# Patient Record
Sex: Female | Born: 1990
Health system: Southern US, Community
[De-identification: ages and names within clinical notes are randomized; demographics above are authoritative.]

## PROBLEM LIST (undated history)

## (undated) ENCOUNTER — Inpatient Hospital Stay (HOSPITAL_COMMUNITY): Payer: Self-pay

## (undated) DIAGNOSIS — F419 Anxiety disorder, unspecified: Secondary | ICD-10-CM

## (undated) DIAGNOSIS — F32A Depression, unspecified: Secondary | ICD-10-CM

## (undated) DIAGNOSIS — F410 Panic disorder [episodic paroxysmal anxiety] without agoraphobia: Secondary | ICD-10-CM

## (undated) DIAGNOSIS — T300 Burn of unspecified body region, unspecified degree: Secondary | ICD-10-CM

## (undated) DIAGNOSIS — F329 Major depressive disorder, single episode, unspecified: Secondary | ICD-10-CM

## (undated) HISTORY — PX: ABSCESS DRAINAGE: SHX1119

---
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
## 2008-02-13 ENCOUNTER — Emergency Department: Payer: Self-pay | Admitting: Emergency Medicine

## 2009-02-10 ENCOUNTER — Emergency Department: Payer: Self-pay | Admitting: Emergency Medicine

## 2010-01-05 ENCOUNTER — Emergency Department: Payer: Self-pay | Admitting: Unknown Physician Specialty

## 2010-06-07 ENCOUNTER — Observation Stay: Payer: Self-pay | Admitting: Surgery

## 2010-08-28 ENCOUNTER — Emergency Department: Payer: Self-pay | Admitting: Unknown Physician Specialty

## 2011-01-22 ENCOUNTER — Observation Stay: Payer: Self-pay

## 2011-01-23 ENCOUNTER — Ambulatory Visit: Payer: Self-pay | Admitting: Internal Medicine

## 2011-02-15 ENCOUNTER — Encounter: Payer: Self-pay | Admitting: *Deleted

## 2011-02-15 ENCOUNTER — Emergency Department (HOSPITAL_COMMUNITY)
Admission: EM | Admit: 2011-02-15 | Discharge: 2011-02-15 | Disposition: A | Discharge status: Home / Self Care | Payer: Medicaid Other | Attending: Emergency Medicine | Admitting: Emergency Medicine

## 2011-02-15 DIAGNOSIS — L03319 Cellulitis of trunk, unspecified: Secondary | ICD-10-CM | POA: Insufficient documentation

## 2011-02-15 DIAGNOSIS — L02215 Cutaneous abscess of perineum: Secondary | ICD-10-CM

## 2011-02-15 DIAGNOSIS — Z331 Pregnant state, incidental: Secondary | ICD-10-CM | POA: Insufficient documentation

## 2011-02-15 DIAGNOSIS — F17200 Nicotine dependence, unspecified, uncomplicated: Secondary | ICD-10-CM | POA: Insufficient documentation

## 2011-02-15 DIAGNOSIS — L02219 Cutaneous abscess of trunk, unspecified: Secondary | ICD-10-CM | POA: Insufficient documentation

## 2011-02-15 MED ORDER — OXYCODONE-ACETAMINOPHEN 5-325 MG PO TABS
ORAL_TABLET | ORAL | Status: AC
  Filled 2011-02-15: qty 1

## 2011-02-15 MED ORDER — LIDOCAINE HCL 2 % IJ SOLN
5.0000 mL | Freq: Once | INTRAMUSCULAR | Status: DC
  Filled 2011-02-15: qty 1

## 2011-02-15 MED ORDER — OXYCODONE-ACETAMINOPHEN 5-325 MG PO TABS
2.0000 | ORAL_TABLET | Freq: Once | ORAL | Status: AC
  Administered 2011-02-15: 2 via ORAL
  Filled 2011-02-15: qty 1

## 2011-02-15 MED ORDER — CEPHALEXIN 500 MG PO CAPS: 500.0000 mg | ORAL_CAPSULE | Freq: Four times a day (QID) | ORAL | Status: AC

## 2011-02-15 NOTE — ED Provider Notes (Signed)
History     CSN: [id number] Arrival date & time: 02/15/2011 12:56 PM  Chief Complaint  Patient presents with  . Abscess   HPI Pt reports she noticed pain and swelling to her L perineal area for the last 2-3 days. Denies any drainage or fever. She had a similar abscess on her R side several years ago. She is approximately [redacted]wks pregnant, no complications of pregnancy. No vaginal bleeding or discharge.  Past Medical History  Diagnosis Date  . Pregnant state, incidental     Past Surgical History  Procedure Date  . Abscess drainage     No family history on file.  History  Substance Use Topics  . Smoking status: Current Everyday Smoker -- 0.5 packs/day  . Smokeless tobacco: Not on file  . Alcohol Use: No    OB History    Grav Para Term Preterm Abortions TAB SAB Ect Mult Living   1               Review of Systems All other systems reviewed and are negative except as noted in HPI.   Physical Exam  BP 132/81  Pulse 84  Temp(Src) 98.3 F (36.8 C) (Oral)  Resp 17  Ht 5\' 3"  (1.6 m)  Wt 155 lb 14.4 oz (70.716 kg)  BMI 27.62 kg/m2  SpO2 100%  Physical Exam  Nursing note and vitals reviewed. Constitutional: She is oriented to person, place, and time. She appears well-developed and well-nourished.  HENT:  Head: Normocephalic and atraumatic.  Eyes: EOM are normal. Pupils are equal, round, and reactive to light.  Neck: Normal range of motion. Neck supple.  Cardiovascular: Normal rate, normal heart sounds and intact distal pulses.   Pulmonary/Chest: Effort normal and breath sounds normal.  Abdominal: Bowel sounds are normal. There is no tenderness.       Gravid, consistent with dates  Genitourinary:       There is a 3cm x 2cm tender, fluctuant mass in L perineal area, not at the anal verge  Musculoskeletal: Normal range of motion. She exhibits no edema and no tenderness.  Neurological: She is alert and oriented to person, place, and time. No cranial nerve deficit.  Skin:  Skin is warm and dry. No rash noted.  Psychiatric: She has a normal mood and affect.    ED Course  INCISION AND DRAINAGE Date/Time: 02/15/2011 3:20 PM Performed by: Susy Frizzle B. Authorized by: Pollyann Savoy Consent: Verbal consent obtained. Patient identity confirmed: verbally with patient Time out: Immediately prior to procedure a "time out" was called to verify the correct patient, procedure, equipment, support staff and site/side marked as required. Type: abscess Body area: anogenital Location details: perineum Anesthesia: local infiltration Local anesthetic: lidocaine 2% without epinephrine Patient sedated: no Scalpel size: 11 Incision type: single straight Complexity: simple Drainage: purulent and bloody Drainage amount: scant Wound treatment: drain placed Packing material: 1/4 in iodoform gauze Patient tolerance: Patient tolerated the procedure well with no immediate complications.    MDM Pain controlled with percocet in the ED, but will avoid Rx for narcotics given third trimester of pregnancy. Keflex for surrounding cellulitis.       Jai Bear B. Bernette Mayers, MD 02/15/11 [phone]

## 2011-02-15 NOTE — ED Notes (Signed)
Pt complain of abscess to vag area. States she has had it lanced several times before. Also, pt is preg. States she fell afew days ago and wants her baby's heart beat checked.  FHR 143

## 2011-02-15 NOTE — ED Notes (Signed)
30 plus weeks pregnant, c/o abscess in vaginal area, states she feel 3 days ago and would like her fetal heart rate checked

## 2011-02-15 NOTE — ED Notes (Signed)
Assisted Dr Bernette Mayers with abscess I & D

## 2011-03-20 ENCOUNTER — Observation Stay: Payer: Self-pay

## 2011-03-29 ENCOUNTER — Observation Stay: Payer: Self-pay

## 2011-04-02 ENCOUNTER — Observation Stay: Payer: Self-pay

## 2011-04-07 ENCOUNTER — Observation Stay: Payer: Self-pay | Admitting: Obstetrics and Gynecology

## 2011-04-14 ENCOUNTER — Observation Stay: Payer: Self-pay | Admitting: Obstetrics and Gynecology

## 2011-04-15 ENCOUNTER — Observation Stay: Payer: Self-pay

## 2011-04-16 ENCOUNTER — Inpatient Hospital Stay: Payer: Self-pay | Admitting: Obstetrics & Gynecology

## 2011-04-19 LAB — PATHOLOGY REPORT

## 2011-08-12 ENCOUNTER — Emergency Department: Payer: Self-pay | Admitting: Internal Medicine

## 2011-08-15 LAB — WOUND AEROBIC CULTURE

## 2012-06-25 ENCOUNTER — Emergency Department: Payer: Self-pay | Admitting: Internal Medicine

## 2012-11-01 ENCOUNTER — Emergency Department: Payer: Self-pay | Admitting: Internal Medicine

## 2012-11-08 ENCOUNTER — Emergency Department: Payer: Self-pay | Admitting: Emergency Medicine

## 2012-12-16 ENCOUNTER — Emergency Department: Payer: Self-pay | Admitting: Emergency Medicine

## 2012-12-16 LAB — URINALYSIS, COMPLETE
Bilirubin,UR: NEGATIVE
Blood: NEGATIVE
Glucose,UR: NEGATIVE mg/dL (ref 0–75)
Ketone: NEGATIVE
Ph: 6 (ref 4.5–8.0)
RBC,UR: 2 /HPF (ref 0–5)
Squamous Epithelial: 9
WBC UR: 2 /HPF (ref 0–5)

## 2012-12-16 LAB — COMPREHENSIVE METABOLIC PANEL
Albumin: 3.8 g/dL (ref 3.4–5.0)
Alkaline Phosphatase: 70 U/L (ref 50–136)
Anion Gap: 3 — ABNORMAL LOW (ref 7–16)
Calcium, Total: 8.9 mg/dL (ref 8.5–10.1)
Creatinine: 0.77 mg/dL (ref 0.60–1.30)
EGFR (Non-African Amer.): >60
Osmolality: 277 (ref 275–301)
Potassium: 3.7 mmol/L (ref 3.5–5.1)
SGOT(AST): 15 U/L (ref 15–37)
SGPT (ALT): 15 U/L (ref 12–78)

## 2012-12-16 LAB — CBC
HCT: 36.1 % (ref 35.0–47.0)
MCV: 91 fL (ref 80–100)
Platelet: 234 10*3/uL (ref 150–440)
RBC: 3.95 10*6/uL (ref 3.80–5.20)
RDW: 14.1 % (ref 11.5–14.5)

## 2012-12-16 LAB — WET PREP, GENITAL

## 2013-01-02 ENCOUNTER — Emergency Department: Payer: Self-pay

## 2013-02-14 ENCOUNTER — Emergency Department: Payer: Self-pay | Admitting: Emergency Medicine

## 2013-02-14 LAB — URINALYSIS, COMPLETE
Bacteria: NONE SEEN
Bilirubin,UR: NEGATIVE
Blood: NEGATIVE
Ketone: NEGATIVE
Nitrite: NEGATIVE
Ph: 6 (ref 4.5–8.0)
Protein: NEGATIVE
RBC,UR: 2 /HPF (ref 0–5)
Specific Gravity: 1.027 (ref 1.003–1.030)
WBC UR: 4 /HPF (ref 0–5)

## 2013-02-14 LAB — PREGNANCY, URINE: Pregnancy Test, Urine: NEGATIVE m[IU]/mL

## 2013-02-17 ENCOUNTER — Emergency Department: Payer: Self-pay | Admitting: Emergency Medicine

## 2013-04-02 ENCOUNTER — Emergency Department: Payer: Self-pay | Admitting: Emergency Medicine

## 2013-04-02 LAB — URINALYSIS, COMPLETE
Bilirubin,UR: NEGATIVE
Ketone: NEGATIVE
Nitrite: NEGATIVE
Ph: 7 (ref 4.5–8.0)
RBC,UR: 18 /HPF (ref 0–5)
Squamous Epithelial: 5

## 2013-06-01 ENCOUNTER — Emergency Department: Payer: Self-pay | Admitting: Emergency Medicine

## 2013-06-01 LAB — URINALYSIS, COMPLETE
Bilirubin,UR: NEGATIVE
Glucose,UR: NEGATIVE mg/dL (ref 0–75)
Leukocyte Esterase: NEGATIVE
Protein: NEGATIVE
Squamous Epithelial: 5
WBC UR: 3 /HPF (ref 0–5)

## 2013-06-01 LAB — WET PREP, GENITAL

## 2013-06-01 LAB — GC/CHLAMYDIA PROBE AMP

## 2013-11-26 ENCOUNTER — Emergency Department: Payer: Self-pay | Admitting: Emergency Medicine

## 2013-11-26 LAB — CBC
HCT: 38 % (ref 35.0–47.0)
HGB: 13 g/dL (ref 12.0–16.0)
MCH: 32.2 pg (ref 26.0–34.0)
MCHC: 34.2 g/dL (ref 32.0–36.0)
MCV: 94 fL (ref 80–100)
PLATELETS: 211 10*3/uL (ref 150–440)
RBC: 4.04 10*6/uL (ref 3.80–5.20)
RDW: 14 % (ref 11.5–14.5)
WBC: 5.3 10*3/uL (ref 3.6–11.0)

## 2013-11-26 LAB — COMPREHENSIVE METABOLIC PANEL
ALT: 13 U/L (ref 12–78)
ANION GAP: 8 (ref 7–16)
Albumin: 3.8 g/dL (ref 3.4–5.0)
Alkaline Phosphatase: 68 U/L
BUN: 13 mg/dL (ref 7–18)
Bilirubin,Total: 0.6 mg/dL (ref 0.2–1.0)
CO2: 23 mmol/L (ref 21–32)
Calcium, Total: 9 mg/dL (ref 8.5–10.1)
Chloride: 108 mmol/L — ABNORMAL HIGH (ref 98–107)
Creatinine: 0.72 mg/dL (ref 0.60–1.30)
EGFR (Non-African Amer.): >60
GLUCOSE: 93 mg/dL (ref 65–99)
OSMOLALITY: 277 (ref 275–301)
Potassium: 3.8 mmol/L (ref 3.5–5.1)
SGOT(AST): 11 U/L — ABNORMAL LOW (ref 15–37)
Sodium: 139 mmol/L (ref 136–145)
Total Protein: 6.9 g/dL (ref 6.4–8.2)

## 2013-11-26 LAB — TROPONIN I: Troponin-I: <0.02 ng/mL

## 2013-11-26 LAB — PRO B NATRIURETIC PEPTIDE: B-TYPE NATIURETIC PEPTID: 51 pg/mL (ref 0–125)

## 2013-11-26 LAB — D-DIMER(ARMC): D-Dimer: 385 ng/ml

## 2013-12-01 ENCOUNTER — Emergency Department: Payer: Self-pay | Admitting: Emergency Medicine

## 2013-12-03 LAB — BETA STREP CULTURE(ARMC)

## 2013-12-15 ENCOUNTER — Emergency Department: Payer: Self-pay | Admitting: Emergency Medicine

## 2013-12-15 LAB — CBC
HCT: 37.8 % (ref 35.0–47.0)
HGB: 12.6 g/dL (ref 12.0–16.0)
MCH: 31.3 pg (ref 26.0–34.0)
MCHC: 33.3 g/dL (ref 32.0–36.0)
MCV: 94 fL (ref 80–100)
Platelet: 254 10*3/uL (ref 150–440)
RBC: 4.03 10*6/uL (ref 3.80–5.20)
RDW: 13.7 % (ref 11.5–14.5)
WBC: 14.1 10*3/uL — ABNORMAL HIGH (ref 3.6–11.0)

## 2013-12-15 LAB — MONONUCLEOSIS SCREEN: MONO TEST: NEGATIVE

## 2013-12-17 LAB — BETA STREP CULTURE(ARMC)

## 2014-02-05 ENCOUNTER — Emergency Department: Payer: Self-pay | Admitting: Emergency Medicine

## 2014-02-05 LAB — BASIC METABOLIC PANEL
Anion Gap: 8 (ref 7–16)
BUN: 15 mg/dL (ref 7–18)
CALCIUM: 9.2 mg/dL (ref 8.5–10.1)
CHLORIDE: 107 mmol/L (ref 98–107)
Co2: 22 mmol/L (ref 21–32)
Creatinine: 0.94 mg/dL (ref 0.60–1.30)
EGFR (African American): >60
EGFR (Non-African Amer.): >60
Glucose: 98 mg/dL (ref 65–99)
Osmolality: 275 (ref 275–301)
POTASSIUM: 3.4 mmol/L — AB (ref 3.5–5.1)
Sodium: 137 mmol/L (ref 136–145)

## 2014-02-05 LAB — URINALYSIS, COMPLETE
Bacteria: NONE SEEN
Bilirubin,UR: NEGATIVE
GLUCOSE, UR: NEGATIVE mg/dL (ref 0–75)
Ketone: NEGATIVE
NITRITE: NEGATIVE
PH: 5 (ref 4.5–8.0)
Protein: 30
Specific Gravity: 1.028 (ref 1.003–1.030)
Squamous Epithelial: 16

## 2014-02-05 LAB — CBC
HCT: 39.8 % (ref 35.0–47.0)
HGB: 13.2 g/dL (ref 12.0–16.0)
MCH: 31.6 pg (ref 26.0–34.0)
MCHC: 33.3 g/dL (ref 32.0–36.0)
MCV: 95 fL (ref 80–100)
Platelet: 235 10*3/uL (ref 150–440)
RBC: 4.18 10*6/uL (ref 3.80–5.20)
RDW: 14.1 % (ref 11.5–14.5)
WBC: 5.1 10*3/uL (ref 3.6–11.0)

## 2014-02-05 LAB — LIPASE, BLOOD: Lipase: 93 U/L (ref 73–393)

## 2014-02-06 LAB — URINE CULTURE

## 2014-04-08 ENCOUNTER — Encounter (HOSPITAL_COMMUNITY): Payer: Self-pay | Admitting: Emergency Medicine

## 2014-04-08 ENCOUNTER — Emergency Department (HOSPITAL_COMMUNITY)
Admission: EM | Admit: 2014-04-08 | Discharge: 2014-04-08 | Discharge status: Against Medical Advice | Payer: Medicaid Other | Attending: Emergency Medicine | Admitting: Emergency Medicine

## 2014-04-08 DIAGNOSIS — R110 Nausea: Secondary | ICD-10-CM | POA: Diagnosis not present

## 2014-04-08 DIAGNOSIS — Z720 Tobacco use: Secondary | ICD-10-CM | POA: Diagnosis not present

## 2014-04-08 DIAGNOSIS — R109 Unspecified abdominal pain: Secondary | ICD-10-CM | POA: Insufficient documentation

## 2014-04-08 LAB — CBC WITH DIFFERENTIAL/PLATELET
BASOS ABS: 0 10*3/uL (ref 0.0–0.1)
Basophils Relative: 0 % (ref 0–1)
Eosinophils Absolute: 0.1 10*3/uL (ref 0.0–0.7)
Eosinophils Relative: 1 % (ref 0–5)
HEMATOCRIT: 38.2 % (ref 36.0–46.0)
HEMOGLOBIN: 13.1 g/dL (ref 12.0–15.0)
LYMPHS PCT: 42 % (ref 12–46)
Lymphs Abs: 2.7 10*3/uL (ref 0.7–4.0)
MCH: 31 pg (ref 26.0–34.0)
MCHC: 34.3 g/dL (ref 30.0–36.0)
MCV: 90.5 fL (ref 78.0–100.0)
MONO ABS: 0.4 10*3/uL (ref 0.1–1.0)
Monocytes Relative: 6 % (ref 3–12)
NEUTROS ABS: 3.2 10*3/uL (ref 1.7–7.7)
Neutrophils Relative %: 50 % (ref 43–77)
Platelets: 256 10*3/uL (ref 150–400)
RBC: 4.22 MIL/uL (ref 3.87–5.11)
RDW: 13.4 % (ref 11.5–15.5)
WBC: 6.3 10*3/uL (ref 4.0–10.5)

## 2014-04-08 LAB — COMPREHENSIVE METABOLIC PANEL
ALK PHOS: 80 U/L (ref 39–117)
ALT: 9 U/L (ref 0–35)
AST: 27 U/L (ref 0–37)
Albumin: 4.3 g/dL (ref 3.5–5.2)
Anion gap: 15 (ref 5–15)
BILIRUBIN TOTAL: 0.2 mg/dL — AB (ref 0.3–1.2)
BUN: 14 mg/dL (ref 6–23)
CHLORIDE: 103 meq/L (ref 96–112)
CO2: 23 meq/L (ref 19–32)
CREATININE: 0.71 mg/dL (ref 0.50–1.10)
Calcium: 9.4 mg/dL (ref 8.4–10.5)
GFR calc Af Amer: >90 mL/min (ref >90)
Glucose, Bld: 86 mg/dL (ref 70–99)
Potassium: 3.8 mEq/L (ref 3.7–5.3)
Sodium: 141 mEq/L (ref 137–147)
Total Protein: 7.7 g/dL (ref 6.0–8.3)

## 2014-04-08 LAB — LIPASE, BLOOD: LIPASE: 31 U/L (ref 11–59)

## 2014-04-08 NOTE — ED Notes (Signed)
Pt. reports mid/left abdominal pain with nausea onset this evening , denies emesis or diarrhea , no fever or chills.

## 2014-04-21 ENCOUNTER — Emergency Department (HOSPITAL_COMMUNITY)
Admission: EM | Admit: 2014-04-21 | Discharge: 2014-04-21 | Disposition: A | Discharge status: Home / Self Care | Payer: Medicaid Other | Attending: Emergency Medicine | Admitting: Emergency Medicine

## 2014-04-21 ENCOUNTER — Encounter (HOSPITAL_COMMUNITY): Payer: Self-pay | Admitting: Emergency Medicine

## 2014-04-21 DIAGNOSIS — J029 Acute pharyngitis, unspecified: Secondary | ICD-10-CM | POA: Diagnosis not present

## 2014-04-21 DIAGNOSIS — Z79899 Other long term (current) drug therapy: Secondary | ICD-10-CM | POA: Diagnosis not present

## 2014-04-21 DIAGNOSIS — Z720 Tobacco use: Secondary | ICD-10-CM | POA: Diagnosis not present

## 2014-04-21 LAB — RAPID STREP SCREEN (MED CTR MEBANE ONLY): Streptococcus, Group A Screen (Direct): NEGATIVE

## 2014-04-21 MED ORDER — DEXAMETHASONE SODIUM PHOSPHATE 10 MG/ML IJ SOLN
10.0000 mg | Freq: Once | INTRAMUSCULAR | Status: DC
  Filled 2014-04-21: qty 1

## 2014-04-21 MED ORDER — HYDROCODONE-ACETAMINOPHEN 7.5-325 MG/15ML PO SOLN: 15.0000 mL | ORAL | Status: DC | PRN

## 2014-04-21 MED ORDER — DEXAMETHASONE SODIUM PHOSPHATE 10 MG/ML IJ SOLN
10.0000 mg | Freq: Once | INTRAMUSCULAR | Status: AC
  Administered 2014-04-21: 10 mg via INTRAMUSCULAR

## 2014-04-21 MED ORDER — ACETAMINOPHEN 325 MG PO TABS
650.0000 mg | ORAL_TABLET | Freq: Once | ORAL | Status: AC
  Administered 2014-04-21: 650 mg via ORAL
  Filled 2014-04-21: qty 2

## 2014-04-21 NOTE — ED Provider Notes (Signed)
Medical screening examination/treatment/procedure(s) were performed by non-physician practitioner and as supervising physician I was immediately available for consultation/collaboration.   EKG Interpretation None        Dontez Hauss F Lariza Cothron, MD 04/21/14 1859 

## 2014-04-21 NOTE — Discharge Instructions (Signed)
Your strep is negative. Take hycet for pain as prescribed. Continue salt water gargles. Follow up with your doctor. Return if worsening.   Pharyngitis Pharyngitis is redness, pain, and swelling (inflammation) of your pharynx.  CAUSES  Pharyngitis is usually caused by infection. Most of the time, these infections are from viruses (viral) and are part of a cold. However, sometimes pharyngitis is caused by bacteria (bacterial). Pharyngitis can also be caused by allergies. Viral pharyngitis may be spread from person to person by coughing, sneezing, and personal items or utensils (cups, forks, spoons, toothbrushes). Bacterial pharyngitis may be spread from person to person by more intimate contact, such as kissing.  SIGNS AND SYMPTOMS  Symptoms of pharyngitis include:   Sore throat.   Tiredness (fatigue).   Low-grade fever.   Headache.  Joint pain and muscle aches.  Skin rashes.  Swollen lymph nodes.  Plaque-like film on throat or tonsils (often seen with bacterial pharyngitis). DIAGNOSIS  Your health care provider will ask you questions about your illness and your symptoms. Your medical history, along with a physical exam, is often all that is needed to diagnose pharyngitis. Sometimes, a rapid strep test is done. Other lab tests may also be done, depending on the suspected cause.  TREATMENT  Viral pharyngitis will usually get better in 3-4 days without the use of medicine. Bacterial pharyngitis is treated with medicines that kill germs (antibiotics).  HOME CARE INSTRUCTIONS   Drink enough water and fluids to keep your urine clear or pale yellow.   Only take over-the-counter or prescription medicines as directed by your health care provider:   If you are prescribed antibiotics, make sure you finish them even if you start to feel better.   Do not take aspirin.   Get lots of rest.   Gargle with 8 oz of salt water ( tsp of salt per 1 qt of water) as often as every 1-2 hours  to soothe your throat.   Throat lozenges (if you are not at risk for choking) or sprays may be used to soothe your throat. SEEK MEDICAL CARE IF:   You have large, tender lumps in your neck.  You have a rash.  You cough up green, yellow-brown, or bloody spit. SEEK IMMEDIATE MEDICAL CARE IF:   Your neck becomes stiff.  You drool or are unable to swallow liquids.  You vomit or are unable to keep medicines or liquids down.  You have severe pain that does not go away with the use of recommended medicines.  You have trouble breathing (not caused by a stuffy nose). MAKE SURE YOU:   Understand these instructions.  Will watch your condition.  Will get help right away if you are not doing well or get worse. Document Released: 06/19/2005 Document Revised: 04/09/2013 Document Reviewed: 02/24/2013 [facility] Patient Information 2015 [employer], [place]. This information is not intended to replace advice given to you by your health care provider. Make sure you discuss any questions you have with your health care provider.

## 2014-04-21 NOTE — ED Notes (Signed)
Pt reports throat pain since Sunday. Pain when swallowing. Pain 10/10

## 2014-04-21 NOTE — ED Provider Notes (Signed)
CSN: [id number]     Arrival date & time 04/21/14  1132 History   First MD Initiated Contact with Patient 04/21/14 1141     Chief Complaint  Patient presents with  . Sore Throat     (Consider location/radiation/quality/duration/timing/severity/associated sxs/prior Treatment) HPI Sabrina Rasmussen is a 23 y.o. female  Who presents to ED with complaint of sore throat. States pain started yesterday. No other associated symptoms, including no cough, congestion, fever, chills, nausea, vomiting. States tried taking tylenol and doing salt water gargles with no relief. States this morning pain is severe and pain with swallowing. States she is able to swallow. No other complaints. No recent ill contacts.   Past Medical History  Diagnosis Date  . Pregnant state, incidental    Past Surgical History  Procedure Laterality Date  . Abscess drainage    . Cesarean section     History reviewed. No pertinent family history. History  Substance Use Topics  . Smoking status: Current Every Day Smoker -- 0.50 packs/day  . Smokeless tobacco: Not on file  . Alcohol Use: No   OB History   Grav Para Term Preterm Abortions TAB SAB Ect Mult Living   1              Review of Systems  Constitutional: Negative for fever and chills.  HENT: Positive for sore throat and trouble swallowing. Negative for congestion, facial swelling and postnasal drip.   Respiratory: Negative for cough, chest tightness and shortness of breath.   Cardiovascular: Negative for chest pain, palpitations and leg swelling.  Musculoskeletal: Negative for myalgias, neck pain and neck stiffness.  Skin: Negative for rash.  Neurological: Positive for headaches.  All other systems reviewed and are negative.     Allergies  Motrin  Home Medications   Prior to Admission medications   Medication Sig Start Date End Date Taking? Authorizing Provider  diphenhydramine-acetaminophen (TYLENOL PM EXTRA STRENGTH) 25-500 MG TABS Take 1 tablet  by mouth at bedtime as needed. For sleep     Historical Provider, MD  Prenat w/o A-FE-DSS-Methfol-FA (PRENATAL MULTIVITAMIN) 90-600-400 MG-MCG-MCG tablet Take 1 tablet by mouth daily.      Historical Provider, MD   BP 112/72  Pulse 78  Temp(Src) 98.9 F (37.2 C) (Oral)  Resp 16  SpO2 99% Physical Exam  Nursing note and vitals reviewed. Constitutional: She appears well-developed and well-nourished. No distress.  HENT:  Head: Normocephalic.  Right Ear: Tympanic membrane, external ear and ear canal normal.  Left Ear: Tympanic membrane, external ear and ear canal normal.  Nose: Nose normal.  Mouth/Throat: Uvula is midline and mucous membranes are normal. Posterior oropharyngeal erythema present. No oropharyngeal exudate, posterior oropharyngeal edema or tonsillar abscesses.  Eyes: Conjunctivae are normal.  Neck: Normal range of motion. Neck supple.  Cardiovascular: Normal rate, regular rhythm and normal heart sounds.   Pulmonary/Chest: Breath sounds normal. No respiratory distress. She has no wheezes. She has no rales.  Lymphadenopathy:    She has no cervical adenopathy.  Neurological: She is alert.  Skin: Skin is warm and dry.    ED Course  Procedures (including critical care time) Labs Review Labs Reviewed  RAPID STREP SCREEN  CULTURE, GROUP A STREP    Imaging Review No results found.   EKG Interpretation None      MDM   Final diagnoses:  Pharyngitis    Patient with sore throat since yesterday. No other complaints. On exam, no tonsillar swelling or exudate, no pharyngeal edema, oropharynx is  erythematous, no exudates. Uvula is midline. She is afebrile, nontoxic appearing. She is swallowing without difficulty in emergency department. Will discharge home with pain management, salt water gargles, followup.  Filed Vitals:   04/21/14 1141  BP: 112/72  Pulse: 78  Temp: 98.9 F (37.2 C)  TempSrc: Oral  Resp: 16  SpO2: 99%       Nara Paternoster A Taeshawn Helfman,  PA-C 04/21/14 1215

## 2014-04-23 LAB — CULTURE, GROUP A STREP

## 2014-04-24 ENCOUNTER — Emergency Department (HOSPITAL_COMMUNITY)
Admission: EM | Admit: 2014-04-24 | Discharge: 2014-04-24 | Disposition: A | Discharge status: Home / Self Care | Payer: Medicaid Other | Attending: Emergency Medicine | Admitting: Emergency Medicine

## 2014-04-24 ENCOUNTER — Encounter (HOSPITAL_COMMUNITY): Payer: Self-pay | Admitting: Emergency Medicine

## 2014-04-24 DIAGNOSIS — Z3202 Encounter for pregnancy test, result negative: Secondary | ICD-10-CM | POA: Diagnosis not present

## 2014-04-24 DIAGNOSIS — Z720 Tobacco use: Secondary | ICD-10-CM | POA: Diagnosis not present

## 2014-04-24 DIAGNOSIS — F419 Anxiety disorder, unspecified: Secondary | ICD-10-CM | POA: Diagnosis not present

## 2014-04-24 DIAGNOSIS — R109 Unspecified abdominal pain: Secondary | ICD-10-CM | POA: Insufficient documentation

## 2014-04-24 HISTORY — DX: Panic disorder (episodic paroxysmal anxiety): F41.0

## 2014-04-24 LAB — URINALYSIS, ROUTINE W REFLEX MICROSCOPIC
Bilirubin Urine: NEGATIVE
Glucose, UA: NEGATIVE mg/dL
Hgb urine dipstick: NEGATIVE
Ketones, ur: NEGATIVE mg/dL
Leukocytes, UA: NEGATIVE
Nitrite: NEGATIVE
Protein, ur: NEGATIVE mg/dL
Specific Gravity, Urine: 1.014 (ref 1.005–1.030)
Urobilinogen, UA: 0.2 mg/dL (ref 0.0–1.0)
pH: 6.5 (ref 5.0–8.0)

## 2014-04-24 LAB — PREGNANCY, URINE: Preg Test, Ur: NEGATIVE

## 2014-04-24 MED ORDER — LORAZEPAM 1 MG PO TABS
1.0000 mg | ORAL_TABLET | Freq: Once | ORAL | Status: AC
  Administered 2014-04-24: 1 mg via ORAL
  Filled 2014-04-24: qty 1

## 2014-04-24 MED ORDER — OXYCODONE-ACETAMINOPHEN 5-325 MG PO TABS
1.0000 | ORAL_TABLET | Freq: Once | ORAL | Status: AC
  Administered 2014-04-24: 1 via ORAL
  Filled 2014-04-24: qty 1

## 2014-04-24 MED ORDER — LORAZEPAM 1 MG PO TABS: 0.5000 mg | ORAL_TABLET | Freq: Three times a day (TID) | ORAL | Status: DC | PRN

## 2014-04-24 NOTE — ED Notes (Signed)
[facility]Per EMS, pt reports flank pain that causes her "leg and arm to shake." [facility] that pt appeared to have seizure like activity, pt does not have hx and EMS reports pt was not post-ictal. Pt reports d/t finances she is unable to fill her rx's. Pt takes antidepressant and hydrocodone liquid. Pt has anxiety because she is a single mother. Pt is A&O and in NAD

## 2014-04-24 NOTE — ED Notes (Signed)
[facility], started to have L flank pain and then had "a panic attack." Pt denies UTI s/sx. Pt denies other medical hx. Pt reports 6/10 pain. Pt is A&O and in NAD

## 2014-04-24 NOTE — Progress Notes (Signed)
  CARE MANAGEMENT ED NOTE 04/24/2014  Patient:  Sabrina Rasmussen,Sabrina Rasmussen   Account Number:  [id number]  Date Initiated:  04/24/2014  Documentation initiated by:  Edd ArbourGIBBS,Paden Senger  Subjective/Objective Assessment:   23 [place]     Subjective/Objective Assessment Detail:   [facility]  Telephone: [phone]  Telephone: [phone]  is pcp     Action/Plan:   updated epic   Action/Plan Detail:   Anticipated DC Date:  04/24/2014     Status Recommendation to Physician:   Result of Recommendation:    Other ED Services  Consult Working Plan    DC Planning Services  Other  Outpatient Services - Pt will follow up  PCP issues    Choice offered to / List presented to:            Status of service:  Completed, signed off  ED Comments:   ED Comments Detail:

## 2014-04-24 NOTE — Discharge Instructions (Signed)
Flank Pain Flank pain is pain in your side. The flank is the area of your side between your upper belly (abdomen) and your back. Pain in this area can be caused by many different things. HOME CARE Home care and treatment will depend on the cause of your pain.  Rest as told by your doctor.  Drink enough fluids to keep your pee (urine) clear or pale yellow.  Only take medicine as told by your doctor.  Tell your doctor about any changes in your pain.  Follow up with your doctor. GET HELP RIGHT AWAY IF:   Your pain does not get better with medicine.   You have new symptoms or your symptoms get worse.  Your pain gets worse.   You have belly (abdominal) pain.   You are short of breath.   You always feel sick to your stomach (nauseous).   You keep throwing up (vomiting).   You have puffiness (swelling) in your belly.   You feel light-headed or you pass out (faint).   You have blood in your pee.  You have a fever or lasting symptoms for more than 2-3 days.  You have a fever and your symptoms suddenly get worse. MAKE SURE YOU:   Understand these instructions.  Will watch your condition.  Will get help right away if you are not doing well or get worse. Document Released: 03/28/2008 Document Revised: 11/03/2013 Document Reviewed: 02/01/2012 [facility] Patient Information 2015 [place]Care, [place]LLC. This information is not intended to replace advice given to you by your health care provider. Make sure you discuss any questions you have with your health care provider.  Abdominal Pain Many things can cause abdominal pain. Usually, abdominal pain is not caused by a disease and will improve without treatment. It can often be observed and treated at home. Your health care provider will do a physical exam and possibly order blood tests and X-rays to help determine the seriousness of your pain. However, in many cases, more time must pass before a clear cause of the pain can be found.  Before that point, your health care provider may not know if you need more testing or further treatment. HOME CARE INSTRUCTIONS  Monitor your abdominal pain for any changes. The following actions may help to alleviate any discomfort you are experiencing:  Only take over-the-counter or prescription medicines as directed by your health care provider.  Do not take laxatives unless directed to do so by your health care provider.  Try a clear liquid diet (broth, tea, or water) as directed by your health care provider. Slowly move to a bland diet as tolerated. SEEK MEDICAL CARE IF:  You have unexplained abdominal pain.  You have abdominal pain associated with nausea or diarrhea.  You have pain when you urinate or have a bowel movement.  You experience abdominal pain that wakes you in the night.  You have abdominal pain that is worsened or improved by eating food.  You have abdominal pain that is worsened with eating fatty foods.  You have a fever. SEEK IMMEDIATE MEDICAL CARE IF:   Your pain does not go away within 2 hours.  You keep throwing up (vomiting).  Your pain is felt only in portions of the abdomen, such as the right side or the left lower portion of the abdomen.  You pass bloody or black tarry stools. MAKE SURE YOU:  Understand these instructions.   Will watch your condition.   Will get help right away if you are  not doing well or get worse.  Document Released: 03/29/2005 Document Revised: 06/24/2013 Document Reviewed: 02/26/2013 [facility] Patient Information 2015 [place]ExitCare, [place]LLC. This information is not intended to replace advice given to you by your health care provider. Make sure you discuss any questions you have with your health care provider.   Emergency Department Resource Guide 1) Find a Doctor and Pay Out of Pocket Although you won't have to find out who is covered by your insurance plan, it is a good idea to ask around and get recommendations. You will  then need to call the office and see if the doctor you have chosen will accept you as a new patient and what types of options they offer for patients who are self-pay. Some doctors offer discounts or will set up payment plans for their patients who do not have insurance, but you will need to ask so you aren't surprised when you get to your appointment.  2) Contact Your Local Health Department Not all health departments have doctors that can see patients for sick visits, but many do, so it is worth a call to see if yours does. If you don't know where your local health department is, you can check in your phone book. The CDC also has a tool to help you locate your state's health department, and many state websites also have listings of all of their local health departments.  3) Find a Walk-in Clinic If your illness is not likely to be very severe or complicated, you may want to try a walk in clinic. These are popping up all over the country in pharmacies, drugstores, and shopping centers. They're usually staffed by nurse practitioners or physician assistants that have been trained to treat common illnesses and complaints. They're usually fairly quick and inexpensive. However, if you have serious medical issues or chronic medical problems, these are probably not your best option.  No Primary Care Doctor: - Call Health Connect at  [phone] - they can help you locate a primary care doctor that  accepts your insurance, provides certain services, etc. - Physician Referral Service- [phone]  Chronic Pain Problems: Organization         Address  Phone   Notes  [facility]  [phone] Patients need to be referred by their primary care doctor.   Medication Assistance: Organization         Address  Phone   Notes  [facility] [address] [place], [place] [address] [phone] --Must be a resident of [place] [facility] [place] [facility] -- Must have NO  insurance coverage whatsoever (no Medicaid/ Medicare, etc.) -- The pt. MUST have a primary care doctor that directs their care regularly and follows them in the community   [facility]  [phone]   [employer]  [phone]    Agencies that provide inexpensive medical care: Organization         Address  Phone   Notes  [facility]  [phone]   [facility]    [phone]   [facility] [address] [place], [place] [address] [phone]   [facility] [address], [place] [phone]   [facility]    [phone]   [facility]    [phone]   [facility]  [address], [place] Phone:  [phone], Fax:  [phone] Hours of Operation:  9 am - 6 pm, M-F.  Also accepts Medicaid/Medicare and self-pay.  °[facility] ° [address], [place] Phone: [phone], Fax: [phone]. Hours of Operation:  8:30 am - 5:30 pm, M-F.  Also accepts Medicaid and self-pay.  °[facility] [place] [address], [place] Phone: [phone]   °[facility] [address], [place], [place] ([phone], Ext. 123 Mondays & Thursdays: 7-9 AM.  First 15 patients are seen on a first come, first serve basis. °  ° °Medicaid-accepting [place] Providers: ° °Organization         Address  Phone   Notes  °[facility] [address], [place] [phone] Also accepts self-pay patients.  °[facility] [address], [place] ° ([phone]) [phone]   °[facility] [address], [place] ([phone]) [phone]   °[facility] [address] [place] [address], [place] ([phone]) [phone]   °Veita Bland [address], [place]  ° [phone]) [phone] Only accepts [place] Access Medicaid patients after they have  their name applied to their card.  ° °Self-Pay (no insurance) in [place]: ° °Organization         Address  Phone   Notes  °Sickle Cell Patients, [facility] [address], [place] [phone]) [phone]   °[facility] [address], [place] ([phone]) [phone]   °[facility] ° [address], [place] ([phone]) [phone]   °[facility]/Dr. Osei-Bonsu ° [address] [place] [address], [place] or [address], [place] [phone] Phone number for both [place] and [place] locations is the same.  °[facility] [address], [place] [phone]) [phone]   °[facility] [address] [place] [address], [place] or [address] ([phone]) [phone] °([phone]) [phone]   °[facility] [address], [place] (336) [phone], phone; [phone], fax Sees patients 1st and 3rd Saturday of every month.  Must not qualify for public or private insurance (i.e. Medicaid, Medicare, [place] Health Choice, Veterans' Benefits) • Household income should be no more than 200% of the poverty level •The clinic cannot treat you if you are pregnant or think you are pregnant • Sexually transmitted diseases are not treated at the clinic.  ° ° °Dental Care: °Organization         Address  Phone  Notes  °[place] [facility] [address], [place] ([phone]) [phone] Accepts children up to age 21 who are enrolled in Medicaid or [place] Health Choice; pregnant women with a Medicaid card; and children who have applied for Medicaid or [place] Health Choice, but were declined, whose parents can pay a reduced fee at time of service.  °[place] [facility] [place]  [address], [place] [phone]) [phone] Accepts children up to age 21 who are enrolled in Medicaid or [place] Health Choice; pregnant women with a Medicaid card; and children who have applied  for Medicaid or  Health Choice, but were declined, whose parents can pay a reduced fee at time of service.  °[facility] ° [address], [place] [phone]) [phone] Patients are seen by appointment only. Walk-ins are not accepted. [facility] will see patients 18 years   of age and older. °Monday - Tuesday (8am-5pm) °Most Wednesdays (8:30-5pm) °$30 per visit, cash only  °[facility] ° [address], [place] [phone]) [phone] Patients are seen by appointment only. Walk-ins are not accepted. [facility] will see patients 18 years of age and older. °One Wednesday Evening (Monthly: Volunteer Based).  $30 per visit, cash only  °[facility]  [phone] for adults; Children under age 4, call Graduate Pediatric Dentistry at [phone]. Children aged 4-14, please call [phone] to request a pediatric application. ° Dental services are provided in all areas of dental care including fillings, crowns and bridges, complete and partial dentures, implants, gum treatment, root canals, and extractions. Preventive care is also provided. Treatment is provided to both adults and children. °Patients are selected via a lottery and there is often a waiting list. °  °[facility] [address], °[place] ° ([phone]) [phone] [email or web address].[email or web address] °  °[facility] [address], [place], [place] (336)[phone], Ext. 123 Second and Fourth Thursday of each month, opens at 6:30 AM; Clinic ends at 9 AM.  Patients are seen on a first-come first-served basis, and a limited number are seen during each clinic.  ° °[facility] ° [address], [place], [place] [phone]) [phone]   Eligibility Requirements °You must have lived in [place], [place], or [place] counties for at least the last three months. °  You cannot be eligible for state or federal sponsored healthcare insurance, including Veterans Administration,  Medicaid, or Medicare. °  You generally cannot be eligible for healthcare insurance through your employer.  °  How to apply: °Eligibility screenings are held every Tuesday and Wednesday afternoon from 1:00 pm until 4:00 pm. You do not need an appointment for the interview!  °[facility] [address], [place], [place] [phone]   °[facility]  [phone]   °[facility]  [phone]   °[facility]  336-570-6415   ° °Behavioral Health Resources in the Community: °Intensive Outpatient Programs °Organization         Address  Phone  Notes  °[facility] [address], [place], [place] [phone]   °[facility] [address], [place], [place] [phone]   °[facility] [address], [place], [place] ° 336-882-2125   °[facility] [address],  °[place], [place] 1-800-853-5163 or 336-641-4981   °Substance Abuse Resources °Organization         Address  Phone  Notes  °[facility]  336-882-2125   °[facility]  336-784-9470   °[facility]  336-285-9073   °[facility]  336-845-3988   °Residential & Outpatient Substance Abuse Program  1-800-659-3381   °Psychological Services °Organization         Address  Phone  Notes  °[facility]  336- [phone]   °[facility]  336- 378-7881   °[facility] [address], [place] 1-800-853-5163 or 336-641-4981   ° °Mobile Crisis Teams °Organization         Address  Phone  Notes  °[facility], Mobile Crisis Care Unit  1-877-626-1772   °Assertive °[facility] ° [address] [place], [place] 336-834-9664   °Sharon DeEsch [address] °  336-554-5454   ° °Self-Help/Support Groups °Organization           Address  Phone             Notes  [facility] [place] - variety of support  groups  336- I7437963 Call for more information  Narcotics Anonymous (NA), [facility] [address], [employer] [place], [place]  2 meetings at this location   Statistician         Address  Phone  Notes  [facility] [address],    [place]  [phone]   [facility]  [address] [place] [address], [place], [place] [phone]   [facility] [address] [place], [place] [phone] Admissions: 8am-3pm M-F  [facility] [address]    [place], [place] [phone]   [facility] [address] [place], [place], [place] [phone]   [facility] [address]  [place], [place] [phone]   [facility] - Intensive Outpatient [address], [place], [place] [phone]   [facility] ([facility] [address]  [place], [place] 6-301-601-0932 or [phone])[phone]   [facility] ([facility]) [address] [place], [place] [phone] Accepts Medicaid  [facility] [address],  [place] [phone] Substance Abuse/Addiction Treatment   [facility] Organization         Address  Phone  Notes  [facility]  [phone]   Angie Fava, PhD [address] [place], [place]   [phone] or [phone]   [facility]   [address] [place], [place] [phone]   [facility] [address] [place], [place] [phone] Insurance/Medicaid/sponsorship through [facility] [address]                                    [place], [place] [phone] Therapy/tele-psych/case  [facility] [address], [place] [phone]    Dr. Lolly Mustache  [phone]   [facility]  [employer] [facility] Dept. 1) [address] [place] 2) [address], [place] 3)   [address], [place] [phone]  [phone]   [facility] Child Abuse Hotline 212-807-8193 or 6361820864 (After Hours)

## 2014-04-24 NOTE — ED Notes (Signed)
[facility] Expected date:  Expected time:  Means of arrival:  Comments: EMS- Anxiety

## 2014-04-29 NOTE — ED Provider Notes (Signed)
CSN: [id number]     Arrival date & time 04/24/14  1104 History   First MD Initiated Contact with Patient 04/24/14 1129     Chief Complaint  Patient presents with  . Flank Pain  . Panic Attack     (Consider location/radiation/quality/duration/timing/severity/associated sxs/prior Treatment) HPI  23 year old female with left flank pain. Onset shortly before arrival. Patient stated that she had a sharp pain in her left side then began having "a panic attack." She began to shake uncontrollably. She felt very nervous. Currently feeling better. Stressors include financial and being a single mother. She has no urinary complaints. No unusual vaginal bleeding or discharge.   . Past Medical History  Diagnosis Date  . Pregnant state, incidental   . Panic attack    Past Surgical History  Procedure Laterality Date  . Abscess drainage    . Cesarean section     No family history on file. History  Substance Use Topics  . Smoking status: Current Every Day Smoker -- 0.50 packs/day  . Smokeless tobacco: Not on file  . Alcohol Use: No   OB History   Grav Para Term Preterm Abortions TAB SAB Ect Mult Living   1              Review of Systems  All systems reviewed and negative, other than as noted in HPI.   Allergies  Motrin  Home Medications   Prior to Admission medications   Medication Sig Start Date End Date Taking? Authorizing Provider  ibuprofen (ADVIL,MOTRIN) 200 MG tablet Take 400 mg by mouth every 6 (six) hours as needed for moderate pain.   Yes Historical Provider, MD  HYDROcodone-acetaminophen (HYCET) 7.5-325 mg/15 ml solution Take 15 mLs by mouth every 4 (four) hours as needed for moderate pain or severe pain. 04/21/14   Tatyana A Kirichenko, PA-C  LORazepam (ATIVAN) 1 MG tablet Take 0.5 tablets (0.5 mg total) by mouth 3 (three) times daily as needed for anxiety. 04/24/14   Raeford RazorStephen Krystol Rocco, MD   BP 117/57  Pulse 72  Temp(Src) 98.6 F (37 C) (Oral)  Resp 14  SpO2  100% Physical Exam  Nursing note and vitals reviewed. Constitutional: She appears well-developed and well-nourished. No distress.  HENT:  Head: Normocephalic and atraumatic.  Eyes: Conjunctivae are normal. Right eye exhibits no discharge. Left eye exhibits no discharge.  Neck: Neck supple.  Cardiovascular: Normal rate, regular rhythm and normal heart sounds.  Exam reveals no gallop and no friction rub.   No murmur heard. Pulmonary/Chest: Effort normal and breath sounds normal. No respiratory distress.  Abdominal: Soft. She exhibits no distension. There is no tenderness.  Genitourinary:  No costovertebral angle tenderness  Musculoskeletal: She exhibits no edema and no tenderness.  Neurological: She is alert.  Skin: Skin is warm and dry.  Psychiatric: She has a normal mood and affect. Her behavior is normal. Thought content normal.    ED Course  Procedures (including critical care time) Labs Review Labs Reviewed  URINALYSIS, ROUTINE W REFLEX MICROSCOPIC - Abnormal; Notable for the following:    APPearance CLOUDY (*)    All other components within normal limits  PREGNANCY, URINE    Imaging Review No results found.   EKG Interpretation None      MDM   Final diagnoses:  Left flank pain  Anxiety    23 year old female with left flank pain. Reassuring exam and unremarkable urinalysis. Suspect a strong anxiety component. I have a low suspicion for emergent process. She denies any  suicidal or homicidal ideation. She is not psychotic. I feel she is stable for discharge.    Raeford RazorStephen Britainy Kozub, MD 04/29/14 1106

## 2014-05-04 ENCOUNTER — Encounter (HOSPITAL_COMMUNITY): Payer: Self-pay | Admitting: Emergency Medicine

## 2014-06-03 ENCOUNTER — Encounter (HOSPITAL_COMMUNITY): Payer: Self-pay | Admitting: Emergency Medicine

## 2014-06-03 ENCOUNTER — Emergency Department (HOSPITAL_COMMUNITY)
Admission: EM | Admit: 2014-06-03 | Discharge: 2014-06-03 | Disposition: A | Discharge status: Home / Self Care | Payer: Medicaid Other | Attending: Emergency Medicine | Admitting: Emergency Medicine

## 2014-06-03 DIAGNOSIS — M791 Myalgia: Secondary | ICD-10-CM | POA: Diagnosis not present

## 2014-06-03 DIAGNOSIS — Z9889 Other specified postprocedural states: Secondary | ICD-10-CM | POA: Diagnosis not present

## 2014-06-03 DIAGNOSIS — R531 Weakness: Secondary | ICD-10-CM | POA: Insufficient documentation

## 2014-06-03 DIAGNOSIS — Z720 Tobacco use: Secondary | ICD-10-CM | POA: Diagnosis not present

## 2014-06-03 DIAGNOSIS — R1084 Generalized abdominal pain: Secondary | ICD-10-CM | POA: Diagnosis not present

## 2014-06-03 DIAGNOSIS — M545 Low back pain, unspecified: Secondary | ICD-10-CM

## 2014-06-03 DIAGNOSIS — Z3202 Encounter for pregnancy test, result negative: Secondary | ICD-10-CM | POA: Insufficient documentation

## 2014-06-03 DIAGNOSIS — R109 Unspecified abdominal pain: Secondary | ICD-10-CM

## 2014-06-03 DIAGNOSIS — F410 Panic disorder [episodic paroxysmal anxiety] without agoraphobia: Secondary | ICD-10-CM | POA: Diagnosis not present

## 2014-06-03 DIAGNOSIS — R52 Pain, unspecified: Secondary | ICD-10-CM | POA: Diagnosis present

## 2014-06-03 LAB — LIPASE, BLOOD: Lipase: 20 U/L (ref 11–59)

## 2014-06-03 LAB — COMPREHENSIVE METABOLIC PANEL
ALBUMIN: 4.6 g/dL (ref 3.5–5.2)
ALT: 9 U/L (ref 0–35)
AST: 14 U/L (ref 0–37)
Alkaline Phosphatase: 70 U/L (ref 39–117)
Anion gap: 15 (ref 5–15)
BUN: 10 mg/dL (ref 6–23)
CALCIUM: 10.2 mg/dL (ref 8.4–10.5)
CHLORIDE: 104 meq/L (ref 96–112)
CO2: 24 meq/L (ref 19–32)
Creatinine, Ser: 0.8 mg/dL (ref 0.50–1.10)
GFR calc Af Amer: >90 mL/min (ref >90)
Glucose, Bld: 95 mg/dL (ref 70–99)
Potassium: 4.1 mEq/L (ref 3.7–5.3)
SODIUM: 143 meq/L (ref 137–147)
Total Bilirubin: 0.6 mg/dL (ref 0.3–1.2)
Total Protein: 8.2 g/dL (ref 6.0–8.3)

## 2014-06-03 LAB — CBC WITH DIFFERENTIAL/PLATELET
BASOS ABS: 0 10*3/uL (ref 0.0–0.1)
BASOS PCT: 0 % (ref 0–1)
EOS PCT: 1 % (ref 0–5)
Eosinophils Absolute: 0 10*3/uL (ref 0.0–0.7)
HCT: 41.7 % (ref 36.0–46.0)
Hemoglobin: 14.3 g/dL (ref 12.0–15.0)
LYMPHS PCT: 28 % (ref 12–46)
Lymphs Abs: 2 10*3/uL (ref 0.7–4.0)
MCH: 31.6 pg (ref 26.0–34.0)
MCHC: 34.3 g/dL (ref 30.0–36.0)
MCV: 92.3 fL (ref 78.0–100.0)
Monocytes Absolute: 0.3 10*3/uL (ref 0.1–1.0)
Monocytes Relative: 4 % (ref 3–12)
NEUTROS ABS: 4.7 10*3/uL (ref 1.7–7.7)
Neutrophils Relative %: 67 % (ref 43–77)
PLATELETS: 293 10*3/uL (ref 150–400)
RBC: 4.52 MIL/uL (ref 3.87–5.11)
RDW: 13.9 % (ref 11.5–15.5)
WBC: 7 10*3/uL (ref 4.0–10.5)

## 2014-06-03 LAB — URINALYSIS, ROUTINE W REFLEX MICROSCOPIC
Bilirubin Urine: NEGATIVE
GLUCOSE, UA: NEGATIVE mg/dL
Hgb urine dipstick: NEGATIVE
KETONES UR: NEGATIVE mg/dL
LEUKOCYTES UA: NEGATIVE
NITRITE: NEGATIVE
PH: 7 (ref 5.0–8.0)
Protein, ur: NEGATIVE mg/dL
SPECIFIC GRAVITY, URINE: 1.023 (ref 1.005–1.030)
Urobilinogen, UA: 1 mg/dL (ref 0.0–1.0)

## 2014-06-03 LAB — PREGNANCY, URINE: PREG TEST UR: NEGATIVE

## 2014-06-03 MED ORDER — TRAMADOL HCL 50 MG PO TABS
50.0000 mg | ORAL_TABLET | Freq: Once | ORAL | Status: AC
  Administered 2014-06-03: 50 mg via ORAL
  Filled 2014-06-03: qty 1

## 2014-06-03 MED ORDER — TRAMADOL HCL 50 MG PO TABS: 50.0000 mg | ORAL_TABLET | Freq: Four times a day (QID) | ORAL | Status: DC | PRN

## 2014-06-03 NOTE — Discharge Instructions (Signed)
Ultram for pain as prescribed. Try heating pads. Follow up with primary care doctor for recheck. Your labs and urine analysis are normal today.  Lumbosacral Strain Lumbosacral strain is a strain of any of the parts that make up your lumbosacral vertebrae. Your lumbosacral vertebrae are the bones that make up the lower third of your backbone. Your lumbosacral vertebrae are held together by muscles and tough, fibrous tissue (ligaments).  CAUSES  A sudden blow to your back can cause lumbosacral strain. Also, anything that causes an excessive stretch of the muscles in the low back can cause this strain. This is typically seen when people exert themselves strenuously, fall, lift heavy objects, bend, or crouch repeatedly. RISK FACTORS  Physically demanding work.  Participation in pushing or pulling sports or sports that require a sudden twist of the back (tennis, golf, baseball).  Weight lifting.  Excessive lower back curvature.  Forward-tilted pelvis.  Weak back or abdominal muscles or both.  Tight hamstrings. SIGNS AND SYMPTOMS  Lumbosacral strain may cause pain in the area of your injury or pain that moves (radiates) down your leg.  DIAGNOSIS Your health care provider can often diagnose lumbosacral strain through a physical exam. In some cases, you may need tests such as X-ray exams.  TREATMENT  Treatment for your lower back injury depends on many factors that your clinician will have to evaluate. However, most treatment will include the use of anti-inflammatory medicines. HOME CARE INSTRUCTIONS   Avoid hard physical activities (tennis, racquetball, waterskiing) if you are not in proper physical condition for it. This may aggravate or create problems.  If you have a back problem, avoid sports requiring sudden body movements. Swimming and walking are generally safer activities.  Maintain good posture.  Maintain a healthy weight.  For acute conditions, you may put ice on the injured  area.  Put ice in a plastic bag.  Place a towel between your skin and the bag.  Leave the ice on for 20 minutes, 2-3 times a day.  When the low back starts healing, stretching and strengthening exercises may be recommended. SEEK MEDICAL CARE IF:  Your back pain is getting worse.  You experience severe back pain not relieved with medicines. SEEK IMMEDIATE MEDICAL CARE IF:   You have numbness, tingling, weakness, or problems with the use of your arms or legs.  There is a change in bowel or bladder control.  You have increasing pain in any area of the body, including your belly (abdomen).  You notice shortness of breath, dizziness, or feel faint.  You feel sick to your stomach (nauseous), are throwing up (vomiting), or become sweaty.  You notice discoloration of your toes or legs, or your feet get very cold. MAKE SURE YOU:   Understand these instructions.  Will watch your condition.  Will get help right away if you are not doing well or get worse. Document Released: 03/29/2005 Document Revised: 06/24/2013 Document Reviewed: 02/05/2013 [facility] Patient Information 2015 [place], [place]. This information is not intended to replace advice given to you by your health care provider. Make sure you discuss any questions you have with your health care provider.

## 2014-06-03 NOTE — ED Notes (Addendum)
Pt states that she has had generalized body aches, abdominal pain, and some diarrhea since Sunday. Alert and oriented. Denies cough or resp. Symptoms

## 2014-06-03 NOTE — ED Provider Notes (Signed)
CSN: [id number]     Arrival date & time 06/03/14  1449 History   First MD Initiated Contact with Patient 06/03/14 1549     Chief Complaint  Patient presents with  . Generalized Body Aches  . Abdominal Pain     (Consider location/radiation/quality/duration/timing/severity/associated sxs/prior Treatment) HPI Sabrina Rasmussen is a 23 y.o. female with history of panic, presents to emergency department with complaint of back pain and abdominal pain. Pt states symptoms began 3 days ago. Denies urinary symptoms, no nausea, vomiting. Reports diarrhea. Also reports generalized aches. No URI symptoms. States pain is mainly in her back. No urinary symptoms. Pain worse with walking. States pain was too severe so she came in for evaluation. States took tylenol PM but states it made her solmnolent. Some relief in her pain. No fever, chills, no other complaints.    Past Medical History  Diagnosis Date  . Pregnant state, incidental   . Panic attack    Past Surgical History  Procedure Laterality Date  . Abscess drainage    . Cesarean section     History reviewed. No pertinent family history. History  Substance Use Topics  . Smoking status: Current Every Day Smoker -- 0.50 packs/day  . Smokeless tobacco: Not on file  . Alcohol Use: No   OB History    Gravida Para Term Preterm AB TAB SAB Ectopic Multiple Living   1              Review of Systems  Constitutional: Negative for fever and chills.  HENT: Negative.   Respiratory: Negative for cough, chest tightness and shortness of breath.   Cardiovascular: Negative for chest pain, palpitations and leg swelling.  Gastrointestinal: Positive for abdominal pain. Negative for nausea, vomiting and diarrhea.  Genitourinary: Negative for dysuria, flank pain, vaginal bleeding, vaginal discharge, vaginal pain and pelvic pain.  Musculoskeletal: Positive for myalgias and back pain. Negative for arthralgias, neck pain and neck stiffness.  Skin: Negative for  rash.  Neurological: Positive for weakness. Negative for dizziness and headaches.  All other systems reviewed and are negative.     Allergies  Review of patient's allergies indicates no known allergies.  Home Medications   Prior to Admission medications   Medication Sig Start Date End Date Taking? Authorizing Provider  diphenhydramine-acetaminophen (TYLENOL PM) 25-500 MG TABS Take 2 tablets by mouth at bedtime as needed (back pain).   Yes Historical Provider, MD  ibuprofen (ADVIL,MOTRIN) 200 MG tablet Take 200 mg by mouth every 6 (six) hours as needed for moderate pain (pain).    Yes Historical Provider, MD  HYDROcodone-acetaminophen (HYCET) 7.5-325 mg/15 ml solution Take 15 mLs by mouth every 4 (four) hours as needed for moderate pain or severe pain. Patient not taking: Reported on 06/03/2014 04/21/14   Paloma Grange A Kandance Yano, PA-C  LORazepam (ATIVAN) 1 MG tablet Take 0.5 tablets (0.5 mg total) by mouth 3 (three) times daily as needed for anxiety. Patient not taking: Reported on 06/03/2014 04/24/14   Raeford RazorStephen Kohut, MD   BP 105/67 mmHg  Pulse 78  Temp(Src) 99.1 F (37.3 C) (Oral)  Resp 17  SpO2 99% Physical Exam  Constitutional: She is oriented to person, place, and time. She appears well-developed and well-nourished. No distress.  HENT:  Head: Normocephalic.  Eyes: Conjunctivae are normal.  Neck: Neck supple.  Cardiovascular: Normal rate, regular rhythm and normal heart sounds.   Pulmonary/Chest: Effort normal and breath sounds normal. No respiratory distress. She has no wheezes. She has no rales.  Abdominal: Soft. Bowel sounds are normal. She exhibits no distension. There is no tenderness. There is no rebound.  Musculoskeletal: She exhibits no edema.  Midline and paravertebral spinal tenderness. Pain with forward flexion and back extension. No pain with bilateral straight leg raise.     Neurological: She is alert and oriented to person, place, and time.  5/5 and equal lower  extremity strength. 2+ and equal patellar reflexes bilaterally. Pt able to dorsiflex bilateral toes and feet with good strength against resistance. Equal sensation bilaterally over thighs and lower legs.   Skin: Skin is warm and dry.  Psychiatric: She has a normal mood and affect. Her behavior is normal.  Nursing note and vitals reviewed.   ED Course  Procedures (including critical care time) Labs Review Labs Reviewed  URINALYSIS, ROUTINE W REFLEX MICROSCOPIC - Abnormal; Notable for the following:    APPearance CLOUDY (*)    All other components within normal limits  CBC WITH DIFFERENTIAL  COMPREHENSIVE METABOLIC PANEL  LIPASE, BLOOD  PREGNANCY, URINE  POC URINE PREG, ED    Imaging Review No results found.   EKG Interpretation None      MDM   Final diagnoses:  Midline low back pain without sciatica  Abdominal pain, unspecified abdominal location   Patient with generalized abdominal pain, back pain. No abdominal tenderness on exam. No evidence of cauda equina. Patient is nontoxic-appearing. Labs obtained and are normal. Patient states that she is here because she couldn't go to work today because of her back pain, states she has to walk about a mile from a parking lot to her building where she works. Patient treated in emergency department with Ultram for pain. When I went to reassess patient she states she has no more pain. She states she feels fine and wants to be discharged home. Patient pregnancy is negative, urinalysis is normal, blood work all unremarkable. Her vital signs are normal. No evidence of emergent condition. Patient stable for discharge home with outpatient follow-up. Ultram for pain at home.   Filed Vitals:   06/03/14 1520 06/03/14 1749  BP: 103/63 105/67  Pulse: 86 78  Temp: 99.5 F (37.5 C) 99.1 F (37.3 C)  TempSrc: Oral Oral  Resp: 20 17  SpO2: 100% 99%       Lottie Musselatyana A Leatta Alewine, PA-C 06/04/14 0025  Arby BarretteMarcy Pfeiffer, MD 06/04/14 1810

## 2014-06-11 ENCOUNTER — Encounter (HOSPITAL_COMMUNITY): Payer: Self-pay | Admitting: Emergency Medicine

## 2014-06-11 ENCOUNTER — Emergency Department (HOSPITAL_COMMUNITY)
Admission: EM | Admit: 2014-06-11 | Discharge: 2014-06-11 | Disposition: A | Discharge status: Home / Self Care | Payer: Medicaid Other | Attending: Emergency Medicine | Admitting: Emergency Medicine

## 2014-06-11 DIAGNOSIS — Z79899 Other long term (current) drug therapy: Secondary | ICD-10-CM | POA: Insufficient documentation

## 2014-06-11 DIAGNOSIS — F410 Panic disorder [episodic paroxysmal anxiety] without agoraphobia: Secondary | ICD-10-CM | POA: Insufficient documentation

## 2014-06-11 DIAGNOSIS — Z720 Tobacco use: Secondary | ICD-10-CM | POA: Diagnosis not present

## 2014-06-11 HISTORY — DX: Anxiety disorder, unspecified: F41.9

## 2014-06-11 NOTE — ED Provider Notes (Signed)
CSN: [id number]     Arrival date & time 06/11/14  1925 History   First MD Initiated Contact with Patient 06/11/14 2044     Chief Complaint  Patient presents with  . Panic Attack     (Consider location/radiation/quality/duration/timing/severity/associated sxs/prior Treatment) The history is provided by the patient.   Sabrina Rasmussen Is a 23 year old female who presents emergency Department with chief complaint of panic attack. Patient states that she takes "some medicine" that she doesn't remember the name of twice a day for her anxiety and anxiety attacks. She states she forgot her medication and home and at lunchtime. She was at work, she realized it was not there. This induced a panic attack. She states she does not have family and cannot call anyone to bring her medication. The patient came to the emergency department for help. She states that her anxiety attack has passed and all she really needs to do is take her medication.  Denies fevers, chills, myalgias, arthralgias. Denies DOE, SOB, chest tightness or pressure, radiation to left arm, jaw or back, or diaphoresis. Denies dysuria, flank pain, suprapubic pain, frequency, urgency, or hematuria. Denies headaches, light headedness, weakness, visual disturbances. Denies abdominal pain, nausea, vomiting, diarrhea or constipation.     Past Medical History  Diagnosis Date  . Pregnant state, incidental   . Panic attack   . Anxiety    Past Surgical History  Procedure Laterality Date  . Abscess drainage    . Cesarean section     No family history on file. History  Substance Use Topics  . Smoking status: Current Every Day Smoker -- 0.50 packs/day  . Smokeless tobacco: Not on file  . Alcohol Use: No   OB History    Gravida Para Term Preterm AB TAB SAB Ectopic Multiple Living   1              Review of Systems  Ten systems reviewed and are negative for acute change, except as noted in the HPI.    Allergies  Review of  patient's allergies indicates no known allergies.  Home Medications   Prior to Admission medications   Medication Sig Start Date End Date Taking? Authorizing Provider  diphenhydramine-acetaminophen (TYLENOL PM) 25-500 MG TABS Take 2 tablets by mouth at bedtime as needed (back pain).    Historical Provider, MD  HYDROcodone-acetaminophen (HYCET) 7.5-325 mg/15 ml solution Take 15 mLs by mouth every 4 (four) hours as needed for moderate pain or severe pain. Patient not taking: Reported on 06/03/2014 04/21/14   Tatyana A Kirichenko, PA-C  ibuprofen (ADVIL,MOTRIN) 200 MG tablet Take 200 mg by mouth every 6 (six) hours as needed for moderate pain (pain).     Historical Provider, MD  LORazepam (ATIVAN) 1 MG tablet Take 0.5 tablets (0.5 mg total) by mouth 3 (three) times daily as needed for anxiety. Patient not taking: Reported on 06/03/2014 04/24/14   Raeford RazorStephen Kohut, MD  traMADol (ULTRAM) 50 MG tablet Take 1 tablet (50 mg total) by mouth every 6 (six) hours as needed. 06/03/14   Tatyana A Kirichenko, PA-C   BP 117/77 mmHg  Pulse 81  Temp(Src) 98.4 F (36.9 C) (Oral)  Resp 18  Ht 5\' 3"  (1.6 m)  Wt 135 lb (61.236 kg)  BMI 23.92 kg/m2  SpO2 100% Physical Exam  Constitutional: She is oriented to person, place, and time. She appears well-developed and well-nourished. No distress.  HENT:  Head: Normocephalic and atraumatic.  Eyes: Conjunctivae are normal. No scleral icterus.  Neck: Normal range of motion.  Cardiovascular: Normal rate, regular rhythm and normal heart sounds.  Exam reveals no gallop and no friction rub.   No murmur heard. Pulmonary/Chest: Effort normal and breath sounds normal. No respiratory distress.  Abdominal: Soft. Bowel sounds are normal. She exhibits no distension and no mass. There is no tenderness. There is no guarding.  Neurological: She is alert and oriented to person, place, and time.  Skin: Skin is warm and dry. She is not diaphoretic.  Nursing note and vitals  reviewed.   ED Course  Procedures (including critical care time) Labs Review Labs Reviewed - No data to display  Imaging Review No results found.   EKG Interpretation None      MDM   Final diagnoses:  Panic attack    Patient with panic attack. Resolved. No other complaints at this time. The patient appears reasonably screened and/or stabilized for discharge and I doubt any other medical condition or other [facility] requiring further screening, evaluation, or treatment in the ED at this time prior to discharge.   Arthor CaptainAbigail Mylynn Dinh, PA-C 06/12/14 [id number]  Toy CookeyMegan Docherty, MD 06/12/14 [phone]

## 2014-06-11 NOTE — Discharge Instructions (Signed)

## 2014-06-11 NOTE — ED Notes (Signed)
Pt. Arrived with EMS from work  reports panic / anxiety attack this evening , calm and focused at arrival , pt. stated she is feeling better .

## 2014-06-11 NOTE — ED Notes (Signed)
MD at bedside. 

## 2014-07-16 ENCOUNTER — Encounter (HOSPITAL_COMMUNITY): Payer: Self-pay | Admitting: Emergency Medicine

## 2014-07-16 ENCOUNTER — Emergency Department (HOSPITAL_COMMUNITY)
Admission: EM | Admit: 2014-07-16 | Discharge: 2014-07-17 | Disposition: A | Discharge status: Home / Self Care | Payer: Medicaid Other | Attending: Emergency Medicine | Admitting: Emergency Medicine

## 2014-07-16 DIAGNOSIS — Z720 Tobacco use: Secondary | ICD-10-CM | POA: Insufficient documentation

## 2014-07-16 DIAGNOSIS — F419 Anxiety disorder, unspecified: Secondary | ICD-10-CM | POA: Diagnosis not present

## 2014-07-16 DIAGNOSIS — N898 Other specified noninflammatory disorders of vagina: Secondary | ICD-10-CM

## 2014-07-16 DIAGNOSIS — R102 Pelvic and perineal pain: Secondary | ICD-10-CM | POA: Diagnosis present

## 2014-07-16 DIAGNOSIS — Z3202 Encounter for pregnancy test, result negative: Secondary | ICD-10-CM | POA: Diagnosis not present

## 2014-07-16 DIAGNOSIS — N899 Noninflammatory disorder of vagina, unspecified: Secondary | ICD-10-CM | POA: Diagnosis not present

## 2014-07-16 NOTE — ED Notes (Signed)
Pt states she is having vaginal pain that started 3 days ago  Pt states she has an ulceration to her vaginal area  Pt states there is skin missing  Denies drainage

## 2014-07-17 LAB — URINALYSIS, ROUTINE W REFLEX MICROSCOPIC
Bilirubin Urine: NEGATIVE
GLUCOSE, UA: NEGATIVE mg/dL
HGB URINE DIPSTICK: NEGATIVE
Ketones, ur: NEGATIVE mg/dL
Leukocytes, UA: NEGATIVE
Nitrite: NEGATIVE
PROTEIN: NEGATIVE mg/dL
SPECIFIC GRAVITY, URINE: 1.02 (ref 1.005–1.030)
Urobilinogen, UA: 0.2 mg/dL (ref 0.0–1.0)
pH: 6 (ref 5.0–8.0)

## 2014-07-17 LAB — WET PREP, GENITAL
Clue Cells Wet Prep HPF POC: NONE SEEN
TRICH WET PREP: NONE SEEN
YEAST WET PREP: NONE SEEN

## 2014-07-17 LAB — GC/CHLAMYDIA PROBE AMP (~~LOC~~) NOT AT ARMC
CHLAMYDIA, DNA PROBE: NEGATIVE
Neisseria Gonorrhea: NEGATIVE

## 2014-07-17 LAB — POC URINE PREG, ED: Preg Test, Ur: NEGATIVE

## 2014-07-17 MED ORDER — ACYCLOVIR 400 MG PO TABS: 400.0000 mg | ORAL_TABLET | Freq: Three times a day (TID) | ORAL | Status: DC

## 2014-07-17 NOTE — ED Provider Notes (Signed)
CSN: [id number]     Arrival date & time 07/16/14  1916 History   First MD Initiated Contact with Patient 07/16/14 2336     Chief Complaint  Patient presents with  . Vaginal Pain     (Consider location/radiation/quality/duration/timing/severity/associated sxs/prior Treatment) HPI Comments: Patient presents today with a vaginal lesion that has been present for the past 3 days.  She states that the area is very tender to palpation.  She has never had anything like this before.  No drainage from the area.  No treatment prior to arrival.  She reports that she is sexually active and has had unprotected sex.  She denies vaginal discharge.  Denies abdominal pain, pelvic pain, fever, chills, nausea, or vomiting.  No history of Herpes.  She does have a history of Chlamydia.    Patient is a 24 y.o. female presenting with vaginal pain. The history is provided by the patient.  Vaginal Pain    Past Medical History  Diagnosis Date  . Pregnant state, incidental   . Panic attack   . Anxiety    Past Surgical History  Procedure Laterality Date  . Abscess drainage    . Cesarean section     History reviewed. No pertinent family history. History  Substance Use Topics  . Smoking status: Current Every Day Smoker -- 0.50 packs/day  . Smokeless tobacco: Not on file  . Alcohol Use: No   OB History    Gravida Para Term Preterm AB TAB SAB Ectopic Multiple Living   1              Review of Systems  Genitourinary: Positive for vaginal pain.  All other systems reviewed and are negative.     Allergies  Review of patient's allergies indicates no known allergies.  Home Medications   Prior to Admission medications   Medication Sig Start Date End Date Taking? Authorizing Provider  benzocaine-resorcinol (VAGISIL) 5-2 % vaginal cream Place 1 application vaginally at bedtime.   Yes Historical Provider, MD  diphenhydramine-acetaminophen (TYLENOL PM) 25-500 MG TABS Take 2 tablets by mouth at bedtime as  needed (back pain).   Yes Historical Provider, MD  ibuprofen (ADVIL,MOTRIN) 200 MG tablet Take 200 mg by mouth every 6 (six) hours as needed for moderate pain (pain).    Yes Historical Provider, MD  medroxyPROGESTERone (DEPO-PROVERA) 150 MG/ML injection Inject 150 mg into the muscle every 3 (three) months.   Yes Historical Provider, MD  HYDROcodone-acetaminophen (HYCET) 7.5-325 mg/15 ml solution Take 15 mLs by mouth every 4 (four) hours as needed for moderate pain or severe pain. Patient not taking: Reported on 06/03/2014 04/21/14   Tatyana A Kirichenko, PA-C  LORazepam (ATIVAN) 1 MG tablet Take 0.5 tablets (0.5 mg total) by mouth 3 (three) times daily as needed for anxiety. Patient not taking: Reported on 06/03/2014 04/24/14   Raeford RazorStephen Kohut, MD  traMADol (ULTRAM) 50 MG tablet Take 1 tablet (50 mg total) by mouth every 6 (six) hours as needed. Patient not taking: Reported on 07/16/2014 06/03/14   Tatyana A Kirichenko, PA-C   BP 117/73 mmHg  Pulse 76  Temp(Src) 98.4 F (36.9 C) (Oral)  Resp 16  SpO2 100% Physical Exam  Constitutional: She appears well-developed and well-nourished.  HENT:  Head: Normocephalic and atraumatic.  Mouth/Throat: Oropharynx is clear and moist.  Neck: Normal range of motion. Neck supple.  Cardiovascular: Normal rate, regular rhythm and normal heart sounds.   Pulmonary/Chest: Effort normal and breath sounds normal.  Abdominal: Soft. Bowel sounds  are normal. She exhibits no distension and no mass. There is no tenderness. There is no rebound and no guarding.  Genitourinary:    Cervix exhibits no motion tenderness. Right adnexum displays no mass, no tenderness and no fullness. Left adnexum displays no mass, no tenderness and no fullness.  Neurological: She is alert.  Skin: Skin is warm and dry.  Psychiatric: She has a normal mood and affect.  Nursing note and vitals reviewed.   ED Course  Procedures (including critical care time) Labs Review Labs Reviewed  WET  PREP, GENITAL - Abnormal; Notable for the following:    WBC, Wet Prep HPF POC RARE (*)    All other components within normal limits  URINALYSIS, ROUTINE W REFLEX MICROSCOPIC  POC URINE PREG, ED  GC/CHLAMYDIA PROBE AMP ([place])    Imaging Review No results found.   EKG Interpretation None      MDM   Final diagnoses:  None   Patient presents today with a painful lesion of the labia minora.  Lesion appears ulcerated on exam.  No fluctuance.  She denies any trauma to the area.  Feel that the lesion is most consistent with Genital Herpes.  Patient started on Acyclovir.  GC/Chlamydia pending.  Patient declined HIV testing.  Stable for discharge.  Return precautions given.     Santiago Glad, PA-C 07/18/14 2024  Olivia Mackie, MD 07/22/14 [phone]

## 2014-09-02 ENCOUNTER — Emergency Department (HOSPITAL_COMMUNITY)
Admission: EM | Admit: 2014-09-02 | Discharge: 2014-09-03 | Disposition: A | Discharge status: Home / Self Care | Payer: Medicaid Other | Attending: Emergency Medicine | Admitting: Emergency Medicine

## 2014-09-02 ENCOUNTER — Encounter (HOSPITAL_COMMUNITY): Payer: Self-pay

## 2014-09-02 ENCOUNTER — Encounter (HOSPITAL_COMMUNITY): Payer: Self-pay | Admitting: *Deleted

## 2014-09-02 ENCOUNTER — Emergency Department (HOSPITAL_COMMUNITY)
Admission: EM | Admit: 2014-09-02 | Discharge: 2014-09-02 | Discharge status: Against Medical Advice | Payer: Medicaid Other | Attending: Emergency Medicine | Admitting: Emergency Medicine

## 2014-09-02 DIAGNOSIS — F410 Panic disorder [episodic paroxysmal anxiety] without agoraphobia: Secondary | ICD-10-CM | POA: Diagnosis not present

## 2014-09-02 DIAGNOSIS — Z720 Tobacco use: Secondary | ICD-10-CM | POA: Insufficient documentation

## 2014-09-02 DIAGNOSIS — N390 Urinary tract infection, site not specified: Secondary | ICD-10-CM

## 2014-09-02 DIAGNOSIS — Z792 Long term (current) use of antibiotics: Secondary | ICD-10-CM | POA: Diagnosis not present

## 2014-09-02 DIAGNOSIS — N760 Acute vaginitis: Secondary | ICD-10-CM | POA: Insufficient documentation

## 2014-09-02 DIAGNOSIS — R350 Frequency of micturition: Secondary | ICD-10-CM | POA: Diagnosis present

## 2014-09-02 DIAGNOSIS — Z79899 Other long term (current) drug therapy: Secondary | ICD-10-CM | POA: Diagnosis not present

## 2014-09-02 DIAGNOSIS — Z3202 Encounter for pregnancy test, result negative: Secondary | ICD-10-CM | POA: Diagnosis not present

## 2014-09-02 DIAGNOSIS — R52 Pain, unspecified: Secondary | ICD-10-CM | POA: Diagnosis not present

## 2014-09-02 DIAGNOSIS — B9689 Other specified bacterial agents as the cause of diseases classified elsewhere: Secondary | ICD-10-CM

## 2014-09-02 DIAGNOSIS — R300 Dysuria: Secondary | ICD-10-CM | POA: Diagnosis present

## 2014-09-02 LAB — URINALYSIS, ROUTINE W REFLEX MICROSCOPIC
Bilirubin Urine: NEGATIVE
Glucose, UA: NEGATIVE mg/dL
Hgb urine dipstick: NEGATIVE
Ketones, ur: NEGATIVE mg/dL
NITRITE: POSITIVE — AB
PROTEIN: NEGATIVE mg/dL
SPECIFIC GRAVITY, URINE: 1.031 — AB (ref 1.005–1.030)
Urobilinogen, UA: 1 mg/dL (ref 0.0–1.0)
pH: 5.5 (ref 5.0–8.0)

## 2014-09-02 LAB — POC URINE PREG, ED: Preg Test, Ur: NEGATIVE

## 2014-09-02 LAB — URINE MICROSCOPIC-ADD ON

## 2014-09-02 NOTE — ED Notes (Signed)
charted discharge orders in error

## 2014-09-02 NOTE — ED Provider Notes (Signed)
CSN: [id number]     Arrival date & time 09/02/14  2121 History   First MD Initiated Contact with Patient 09/02/14 2320     Chief Complaint  Patient presents with  . Urinary Frequency     (Consider location/radiation/quality/duration/timing/severity/associated sxs/prior Treatment) Patient is a 24 y.o. female presenting with frequency. The history is provided by the patient and medical records. No language interpreter was used.  Urinary Frequency Pertinent negatives include no abdominal pain, chest pain, coughing, diaphoresis, fatigue, fever, headaches, nausea, rash or vomiting.     Grace BlightDenekia D Boileau is a 24 y.o. female  with a hx of anxiety presents to the Emergency Department complaining of gradual, persistent, progressively worsening urinary frequency and dysuria onset 3 days ago.  Pt reports this is similar to previous UTIs.  She has taken Azo OTC with minimal relief.  She also c/o white vaginal discharge x2 days.  She reports she thinks that she has BV and has been taking an old Rx of flagyl. She has had a total of 3 doses in the last 3 days.  Nothing seems to make her symptoms worse.  Pt denies fever, chills, neck pain, chest pain, SOB, abd pain, N/V/D, weakness, dizziness, syncope.  Pt reports she is sexually active with 1 female partner with condom usage.      Past Medical History  Diagnosis Date  . Pregnant state, incidental   . Panic attack   . Anxiety    Past Surgical History  Procedure Laterality Date  . Abscess drainage    . Cesarean section     History reviewed. No pertinent family history. History  Substance Use Topics  . Smoking status: Current Every Day Smoker -- 0.50 packs/day  . Smokeless tobacco: Not on file  . Alcohol Use: No   OB History    Gravida Para Term Preterm AB TAB SAB Ectopic Multiple Living   1              Review of Systems  Constitutional: Negative for fever, diaphoresis, appetite change, fatigue and unexpected weight change.  HENT: Negative for  mouth sores.   Eyes: Negative for visual disturbance.  Respiratory: Negative for cough, chest tightness, shortness of breath and wheezing.   Cardiovascular: Negative for chest pain.  Gastrointestinal: Negative for nausea, vomiting, abdominal pain, diarrhea and constipation.  Endocrine: Negative for polydipsia, polyphagia and polyuria.  Genitourinary: Positive for dysuria, urgency, frequency and vaginal discharge. Negative for hematuria.  Musculoskeletal: Negative for back pain and neck stiffness.  Skin: Negative for rash.  Allergic/Immunologic: Negative for immunocompromised state.  Neurological: Negative for syncope, light-headedness and headaches.  Hematological: Does not bruise/bleed easily.  Psychiatric/Behavioral: Negative for sleep disturbance. The patient is not nervous/anxious.       Allergies  Review of patient's allergies indicates no known allergies.  Home Medications   Prior to Admission medications   Medication Sig Start Date End Date Taking? Authorizing Provider  metroNIDAZOLE (FLAGYL) 500 MG tablet Take 500 mg by mouth 2 (two) times daily. For 7 days   Yes Historical Provider, MD  phenazopyridine (PYRIDIUM) 95 MG tablet Take 95 mg by mouth 3 (three) times daily as needed for pain.   Yes Historical Provider, MD  acyclovir (ZOVIRAX) 400 MG tablet Take 1 tablet (400 mg total) by mouth 3 (three) times daily. 07/17/14   Heather Laisure, PA-C  cephALEXin (KEFLEX) 500 MG capsule Take 1 capsule (500 mg total) by mouth 4 (four) times daily. 09/03/14   Dahlia ClientHannah Joseh Sjogren, PA-C  HYDROcodone-acetaminophen (  HYCET) 7.5-325 mg/15 ml solution Take 15 mLs by mouth every 4 (four) hours as needed for moderate pain or severe pain. Patient not taking: Reported on 06/03/2014 04/21/14   Tatyana A Kirichenko, PA-C  LORazepam (ATIVAN) 1 MG tablet Take 0.5 tablets (0.5 mg total) by mouth 3 (three) times daily as needed for anxiety. Patient not taking: Reported on 06/03/2014 04/24/14   Raeford Razor,  MD  medroxyPROGESTERone (DEPO-PROVERA) 150 MG/ML injection Inject 150 mg into the muscle every 3 (three) months.    Historical Provider, MD  traMADol (ULTRAM) 50 MG tablet Take 1 tablet (50 mg total) by mouth every 6 (six) hours as needed. Patient not taking: Reported on 07/16/2014 06/03/14   Tatyana A Kirichenko, PA-C   BP 106/68 mmHg  Pulse 83  Temp(Src) 98.3 F (36.8 C) (Oral)  Resp 18  SpO2 100% Physical Exam  Constitutional: She appears well-developed and well-nourished. No distress.  HENT:  Head: Normocephalic and atraumatic.  Eyes: Conjunctivae are normal.  Neck: Normal range of motion.  Cardiovascular: Normal rate, regular rhythm, normal heart sounds and intact distal pulses.   No murmur heard. Pulmonary/Chest: Effort normal and breath sounds normal. No respiratory distress. She has no wheezes.  Abdominal: Soft. Bowel sounds are normal. There is no tenderness. There is no rebound and no guarding. Hernia confirmed negative in the right inguinal area and confirmed negative in the left inguinal area.  Genitourinary: Uterus normal. No labial fusion. There is no rash, tenderness or lesion on the right labia. There is no rash, tenderness or lesion on the left labia. Uterus is not deviated, not enlarged, not fixed and not tender. Cervix exhibits no motion tenderness, no discharge and no friability. Right adnexum displays no mass, no tenderness and no fullness. Left adnexum displays no mass, no tenderness and no fullness. No erythema, tenderness or bleeding in the vagina. No foreign body around the vagina. No signs of injury around the vagina. Vaginal discharge (large, thin, white) found.  Musculoskeletal: Normal range of motion. She exhibits no edema.  Lymphadenopathy:       Right: No inguinal adenopathy present.       Left: No inguinal adenopathy present.  Neurological: She is alert.  Skin: Skin is warm and dry. She is not diaphoretic. No erythema.  Psychiatric: She has a normal mood and  affect.  Nursing note and vitals reviewed.   ED Course  Procedures (including critical care time) Labs Review Labs Reviewed  WET PREP, GENITAL - Abnormal; Notable for the following:    WBC, Wet Prep HPF POC FEW (*)    All other components within normal limits  URINE MICROSCOPIC-ADD ON - Abnormal; Notable for the following:    Squamous Epithelial / LPF FEW (*)    Bacteria, UA MANY (*)    All other components within normal limits  POC URINE PREG, ED  GC/CHLAMYDIA PROBE AMP ([place])    Imaging Review No results found.   EKG Interpretation None      MDM   Final diagnoses:  Bacterial vaginosis  UTI (lower urinary tract infection)   Avalene D Petronio presents with ssx of BV and urinary tract infection.  Pt has been diagnosed with a UTI. Pt is afebrile, no CVA tenderness, normotensive, and denies N/V. Pt also with BV on clinical exam.  She is already taking metronidazole. Wet prep is without clue cells however based on physical exam will continue to treat for BV.  Patient counseled on the correct way to take this medication.  No evidence of yeast.  Patient low risk for STDs. Cultures pending.  Pt to be dc home with antibiotics and instructions to follow up with PCP if symptoms persist.  I have personally reviewed patient's vitals, nursing note and any pertinent labs or imaging.  I performed an undressed physical exam.    It has been determined that no acute conditions requiring further emergency intervention are present at this time. The patient/guardian have been advised of the diagnosis and plan. I reviewed all labs and imaging including any potential incidental findings. We have discussed signs and symptoms that warrant return to the ED and they are listed in the discharge instructions.    Vital signs are stable at discharge.   BP 106/68 mmHg  Pulse 83  Temp(Src) 98.3 F (36.8 C) (Oral)  Resp 18  SpO2 100%         Dierdre Forth, PA-C 09/03/14  0039  Dione Booze, MD 09/03/14 [phone]

## 2014-09-02 NOTE — ED Notes (Signed)
Pt complains of dysuria, urinary frequency, and generalized body aches for the past 3 days. Pt states her urine is cloudy and malodorous.

## 2014-09-02 NOTE — ED Notes (Signed)
Pt came to nurse first station to report that she has to leave to go pick up her daughter.

## 2014-09-02 NOTE — ED Notes (Signed)
Pt complains of frequent urination and painful urination

## 2014-09-03 LAB — WET PREP, GENITAL
Clue Cells Wet Prep HPF POC: NONE SEEN
Trich, Wet Prep: NONE SEEN
Yeast Wet Prep HPF POC: NONE SEEN

## 2014-09-03 LAB — GC/CHLAMYDIA PROBE AMP (~~LOC~~) NOT AT ARMC
CHLAMYDIA, DNA PROBE: NEGATIVE
Neisseria Gonorrhea: NEGATIVE

## 2014-09-03 MED ORDER — CEPHALEXIN 500 MG PO CAPS: 500.0000 mg | ORAL_CAPSULE | Freq: Four times a day (QID) | ORAL | Status: DC

## 2014-09-03 NOTE — Discharge Instructions (Signed)
1. Medications: Keflex, continue the Rx of metronidazole that you began 3 days ago - please take it 2x per day; usual home medications 2. Treatment: rest, drink plenty of fluids, take medications as prescribed 3. Follow Up: Please followup with your primary doctor in 3 days for discussion of your diagnoses and further evaluation after today's visit; if you do not have a primary care doctor use the resource guide provided to find one; return to the ER for fevers, persistent vomiting, worsening abdominal pain or other concerning symptoms.   Bacterial Vaginosis Bacterial vaginosis is a vaginal infection that occurs when the normal balance of bacteria in the vagina is disrupted. It results from an overgrowth of certain bacteria. This is the most common vaginal infection in women of childbearing age. Treatment is important to prevent complications, especially in pregnant women, as it can cause a premature delivery. CAUSES  Bacterial vaginosis is caused by an increase in harmful bacteria that are normally present in smaller amounts in the vagina. Several different kinds of bacteria can cause bacterial vaginosis. However, the reason that the condition develops is not fully understood. RISK FACTORS Certain activities or behaviors can put you at an increased risk of developing bacterial vaginosis, including:  Having a new sex partner or multiple sex partners.  Douching.  Using an intrauterine device (IUD) for contraception. Women do not get bacterial vaginosis from toilet seats, bedding, swimming pools, or contact with objects around them. SIGNS AND SYMPTOMS  Some women with bacterial vaginosis have no signs or symptoms. Common symptoms include:  Grey vaginal discharge.  A fishlike odor with discharge, especially after sexual intercourse.  Itching or burning of the vagina and vulva.  Burning or pain with urination. DIAGNOSIS  Your health care provider will take a medical history and examine the  vagina for signs of bacterial vaginosis. A sample of vaginal fluid may be taken. Your health care provider will look at this sample under a microscope to check for bacteria and abnormal cells. A vaginal pH test may also be done.  TREATMENT  Bacterial vaginosis may be treated with antibiotic medicines. These may be given in the form of a pill or a vaginal cream. A second round of antibiotics may be prescribed if the condition comes back after treatment.  HOME CARE INSTRUCTIONS   Only take over-the-counter or prescription medicines as directed by your health care provider.  If antibiotic medicine was prescribed, take it as directed. Make sure you finish it even if you start to feel better.  Do not have sex until treatment is completed.  Tell all sexual partners that you have a vaginal infection. They should see their health care provider and be treated if they have problems, such as a mild rash or itching.  Practice safe sex by using condoms and only having one sex partner. SEEK MEDICAL CARE IF:   Your symptoms are not improving after 3 days of treatment.  You have increased discharge or pain.  You have a fever. MAKE SURE YOU:   Understand these instructions.  Will watch your condition.  Will get help right away if you are not doing well or get worse. FOR MORE INFORMATION  Centers for Disease Control and Prevention, Division of STD Prevention: SolutionApps.co.zawww.cdc.gov/std American Sexual Health Association (ASHA): www.ashastd.org  Document Released: 06/19/2005 Document Revised: 04/09/2013 Document Reviewed: 01/29/2013 [facility] Patient Information 2015 [place]. This information is not intended to replace advice given to you by your health care provider. Make sure you discuss any questions  you have with your health care provider.

## 2015-02-04 ENCOUNTER — Emergency Department (HOSPITAL_COMMUNITY)
Admission: EM | Admit: 2015-02-04 | Discharge: 2015-02-04 | Disposition: A | Discharge status: Home / Self Care | Payer: Medicaid Other | Attending: Emergency Medicine | Admitting: Emergency Medicine

## 2015-02-04 ENCOUNTER — Encounter (HOSPITAL_COMMUNITY): Payer: Self-pay | Admitting: Emergency Medicine

## 2015-02-04 DIAGNOSIS — F410 Panic disorder [episodic paroxysmal anxiety] without agoraphobia: Secondary | ICD-10-CM | POA: Insufficient documentation

## 2015-02-04 DIAGNOSIS — Z720 Tobacco use: Secondary | ICD-10-CM | POA: Diagnosis not present

## 2015-02-04 DIAGNOSIS — B349 Viral infection, unspecified: Secondary | ICD-10-CM | POA: Diagnosis not present

## 2015-02-04 DIAGNOSIS — R52 Pain, unspecified: Secondary | ICD-10-CM | POA: Diagnosis present

## 2015-02-04 LAB — RAPID STREP SCREEN (MED CTR MEBANE ONLY): Streptococcus, Group A Screen (Direct): NEGATIVE

## 2015-02-04 MED ORDER — IBUPROFEN 800 MG PO TABS: 800.0000 mg | ORAL_TABLET | Freq: Three times a day (TID) | ORAL | Status: DC

## 2015-02-04 MED ORDER — ACETAMINOPHEN 500 MG PO TABS
1000.0000 mg | ORAL_TABLET | Freq: Once | ORAL | Status: AC
  Administered 2015-02-04: 1000 mg via ORAL
  Filled 2015-02-04: qty 2

## 2015-02-04 MED ORDER — CYCLOBENZAPRINE HCL 10 MG PO TABS: 10.0000 mg | ORAL_TABLET | Freq: Two times a day (BID) | ORAL | Status: DC | PRN

## 2015-02-04 NOTE — ED Notes (Signed)
Pt states she has runny nose, cough, congestion and states her body aches from her face to her chest  Pt states she started feeling bad yesterday  States has been taking benadryl but it is not helping

## 2015-02-04 NOTE — ED Provider Notes (Signed)
History  This chart was scribed for non-physician practitioner, Emilia Beck, PA-C,working with Mancel Bale, MD, by Karle Plumber, ED Scribe. This patient was seen in room WTR7/WTR7 and the patient's care was started at 9:37 PM.  Chief Complaint  Patient presents with  . Generalized Body Aches   The history is provided by the patient and medical records. No language interpreter was used.    HPI Comments:  Sabrina Rasmussen is a 24 y.o. female who presents to the Emergency Department complaining of sore throat that began yesterday. She reports associated chills and generalized body aches. She has been taking Ibuprofen and Benadryl with no significant relief of her symptoms. She denies alleviating factors. She denies any known sick contacts. She denies fever, cough, abdominal pain, nausea or vomiting. Denies seasonal allergies.  Past Medical History  Diagnosis Date  . Pregnant state, incidental   . Panic attack   . Anxiety    Past Surgical History  Procedure Laterality Date  . Abscess drainage    . Cesarean section     History reviewed. No pertinent family history. History  Substance Use Topics  . Smoking status: Current Every Day Smoker -- 0.50 packs/day    Types: Cigarettes  . Smokeless tobacco: Not on file  . Alcohol Use: No   OB History    Gravida Para Term Preterm AB TAB SAB Ectopic Multiple Living   1              Review of Systems  Constitutional: Positive for chills. Negative for fever.  Respiratory: Negative for cough.   Gastrointestinal: Negative for nausea, vomiting and abdominal pain.  Musculoskeletal: Positive for myalgias.  All other systems reviewed and are negative.   Allergies  Review of patient's allergies indicates no known allergies.  Home Medications   Prior to Admission medications   Medication Sig Start Date End Date Taking? Authorizing Provider  diphenhydrAMINE (BENADRYL) 25 mg capsule Take 25 mg by mouth every 6 (six) hours as  needed for allergies.   Yes Historical Provider, MD  medroxyPROGESTERone (DEPO-PROVERA) 150 MG/ML injection Inject 150 mg into the muscle every 3 (three) months.   Yes Historical Provider, MD  acyclovir (ZOVIRAX) 400 MG tablet Take 1 tablet (400 mg total) by mouth 3 (three) times daily. Patient not taking: Reported on 02/04/2015 07/17/14   Santiago Glad, PA-C  cephALEXin (KEFLEX) 500 MG capsule Take 1 capsule (500 mg total) by mouth 4 (four) times daily. Patient not taking: Reported on 02/04/2015 09/03/14   Dahlia Client Muthersbaugh, PA-C  HYDROcodone-acetaminophen (HYCET) 7.5-325 mg/15 ml solution Take 15 mLs by mouth every 4 (four) hours as needed for moderate pain or severe pain. Patient not taking: Reported on 06/03/2014 04/21/14   Tatyana Kirichenko, PA-C  LORazepam (ATIVAN) 1 MG tablet Take 0.5 tablets (0.5 mg total) by mouth 3 (three) times daily as needed for anxiety. Patient not taking: Reported on 06/03/2014 04/24/14   Raeford Razor, MD  traMADol (ULTRAM) 50 MG tablet Take 1 tablet (50 mg total) by mouth every 6 (six) hours as needed. Patient not taking: Reported on 07/16/2014 06/03/14   Jaynie Crumble, PA-C   Triage Vitals: BP 107/71 mmHg  Pulse 85  Temp(Src) 98.4 F (36.9 C) (Oral)  Resp 18  SpO2 100% Physical Exam  Constitutional: She is oriented to person, place, and time. She appears well-developed and well-nourished. No distress.  HENT:  Head: Normocephalic and atraumatic.  Right Ear: Tympanic membrane normal.  Left Ear: Tympanic membrane normal.  Mouth/Throat: Uvula is  midline and mucous membranes are normal. Posterior oropharyngeal erythema present. No oropharyngeal exudate, posterior oropharyngeal edema or tonsillar abscesses.  Eyes: Conjunctivae are normal.  Normal appearance  Neck: Normal range of motion.  Cardiovascular: Normal rate, regular rhythm and normal heart sounds.  Exam reveals no gallop and no friction rub.   No murmur heard. Pulmonary/Chest: Effort normal and  breath sounds normal. No respiratory distress. She has no wheezes. She has no rales. She exhibits no tenderness.  Abdominal: Soft. There is no tenderness.  Musculoskeletal: Normal range of motion.  Neurological: She is alert and oriented to person, place, and time.  Speech is goal-oriented. Moves limbs without ataxia.   Skin: Skin is warm and dry.  Psychiatric: She has a normal mood and affect. Her behavior is normal.  Nursing note and vitals reviewed.   ED Course  Procedures (including critical care time) DIAGNOSTIC STUDIES: Oxygen Saturation is 100% on RA, normal by my interpretation.   COORDINATION OF CARE: 9:41 PM- Will order rapid strep test. Pt verbalizes understanding and agrees to plan.  Medications - No data to display  Labs Review Labs Reviewed  RAPID STREP SCREEN (NOT AT [facility])  CULTURE, GROUP A STREP    Imaging Review No results found.   EKG Interpretation None      MDM   Final diagnoses:  Viral illness   10:34 PM Rapid strep negative. Patient likely has a viral illness. Vitals stable and patient afebrile.  I personally performed the services described in this documentation, which was scribed in my presence. The recorded information has been reviewed and is accurate.    Emilia Beck, PA-C 02/04/15 2234  Emilia Beck, PA-C 02/04/15 2235  Mancel Bale, MD 02/04/15 2340

## 2015-02-04 NOTE — Discharge Instructions (Signed)
Take ibuprofen and flexeril as needed for bodyaches. Refer to attached documents for more information. Return to the ED with worsening or concerning symptoms.

## 2015-02-07 LAB — CULTURE, GROUP A STREP

## 2015-06-17 ENCOUNTER — Encounter (HOSPITAL_COMMUNITY): Payer: Self-pay

## 2015-06-17 ENCOUNTER — Emergency Department (HOSPITAL_COMMUNITY)
Admission: EM | Admit: 2015-06-17 | Discharge: 2015-06-17 | Disposition: A | Discharge status: Home / Self Care | Payer: Medicaid Other | Attending: Emergency Medicine | Admitting: Emergency Medicine

## 2015-06-17 DIAGNOSIS — F17210 Nicotine dependence, cigarettes, uncomplicated: Secondary | ICD-10-CM | POA: Diagnosis not present

## 2015-06-17 DIAGNOSIS — Z3202 Encounter for pregnancy test, result negative: Secondary | ICD-10-CM | POA: Insufficient documentation

## 2015-06-17 DIAGNOSIS — R109 Unspecified abdominal pain: Secondary | ICD-10-CM | POA: Diagnosis present

## 2015-06-17 DIAGNOSIS — N760 Acute vaginitis: Secondary | ICD-10-CM | POA: Insufficient documentation

## 2015-06-17 DIAGNOSIS — F410 Panic disorder [episodic paroxysmal anxiety] without agoraphobia: Secondary | ICD-10-CM | POA: Diagnosis not present

## 2015-06-17 DIAGNOSIS — B9689 Other specified bacterial agents as the cause of diseases classified elsewhere: Secondary | ICD-10-CM

## 2015-06-17 DIAGNOSIS — R51 Headache: Secondary | ICD-10-CM | POA: Diagnosis not present

## 2015-06-17 LAB — I-STAT BETA HCG BLOOD, ED (MC, WL, AP ONLY): I-stat hCG, quantitative: <5 m[IU]/mL (ref <5)

## 2015-06-17 LAB — CBC
HCT: 37.4 % (ref 36.0–46.0)
Hemoglobin: 12.6 g/dL (ref 12.0–15.0)
MCH: 31.8 pg (ref 26.0–34.0)
MCHC: 33.7 g/dL (ref 30.0–36.0)
MCV: 94.4 fL (ref 78.0–100.0)
Platelets: 303 10*3/uL (ref 150–400)
RBC: 3.96 MIL/uL (ref 3.87–5.11)
RDW: 13.8 % (ref 11.5–15.5)
WBC: 4.7 10*3/uL (ref 4.0–10.5)

## 2015-06-17 LAB — URINALYSIS, ROUTINE W REFLEX MICROSCOPIC
Bilirubin Urine: NEGATIVE
GLUCOSE, UA: NEGATIVE mg/dL
HGB URINE DIPSTICK: NEGATIVE
Ketones, ur: NEGATIVE mg/dL
Nitrite: NEGATIVE
Protein, ur: NEGATIVE mg/dL
SPECIFIC GRAVITY, URINE: 1.018 (ref 1.005–1.030)
pH: 7 (ref 5.0–8.0)

## 2015-06-17 LAB — COMPREHENSIVE METABOLIC PANEL
ALT: 12 U/L — AB (ref 14–54)
AST: 16 U/L (ref 15–41)
Albumin: 4.4 g/dL (ref 3.5–5.0)
Alkaline Phosphatase: 61 U/L (ref 38–126)
Anion gap: 10 (ref 5–15)
BUN: 12 mg/dL (ref 6–20)
CHLORIDE: 107 mmol/L (ref 101–111)
CO2: 22 mmol/L (ref 22–32)
CREATININE: 0.69 mg/dL (ref 0.44–1.00)
Calcium: 9.4 mg/dL (ref 8.9–10.3)
GFR calc Af Amer: >60 mL/min (ref >60)
GFR calc non Af Amer: >60 mL/min (ref >60)
Glucose, Bld: 93 mg/dL (ref 65–99)
Potassium: 3.7 mmol/L (ref 3.5–5.1)
SODIUM: 139 mmol/L (ref 135–145)
Total Bilirubin: 0.7 mg/dL (ref 0.3–1.2)
Total Protein: 7.1 g/dL (ref 6.5–8.1)

## 2015-06-17 LAB — WET PREP, GENITAL
SPERM: NONE SEEN
TRICH WET PREP: NONE SEEN
Yeast Wet Prep HPF POC: NONE SEEN

## 2015-06-17 LAB — URINE MICROSCOPIC-ADD ON

## 2015-06-17 LAB — LIPASE, BLOOD: Lipase: 24 U/L (ref 11–51)

## 2015-06-17 MED ORDER — METRONIDAZOLE 500 MG PO TABS: 500.0000 mg | ORAL_TABLET | Freq: Two times a day (BID) | ORAL | Status: DC

## 2015-06-17 NOTE — ED Notes (Signed)
INITIAL ASSESSMENT COMPLETED. PT C/O LOWER ABDOMINAL CRAMPING WITH VAGINAL DISCHARGE SINCE Monday. PT ADMITTED TO HAVING UNPROTECTED SEX OVER THE WEEKEND,A ND STATES THAT HER PARTNER INFORMED HER ON Tuesday THAT HE MAY HAVE GIVEN HER AN STD. AWAITING FURTHER ORDERS.

## 2015-06-17 NOTE — ED Provider Notes (Signed)
CSN: [id number]     Arrival date & time 06/17/15  1056 History   First MD Initiated Contact with Patient 06/17/15 1107     Chief Complaint  Patient presents with  . Abdominal Pain  . Headache     (Consider location/radiation/quality/duration/timing/severity/associated sxs/prior Treatment) HPI Comments: Pt comes in with need to be checked for std. Pt states that her boyfriend was recently put in jail and he called and stated that she probably need to be checked for an std.she denies vomiting, vaginal discharge, abdominal pain. She states that she has had some urinary frequency. She doesn't use and protection and had the last depo shot 4 months ago. She state that she has also been having a headache.  The history is provided by the patient. No language interpreter was used.    Past Medical History  Diagnosis Date  . Pregnant state, incidental   . Panic attack   . Anxiety    Past Surgical History  Procedure Laterality Date  . Abscess drainage    . Cesarean section     History reviewed. No pertinent family history. Social History  Substance Use Topics  . Smoking status: Current Every Day Smoker -- 0.50 packs/day    Types: Cigarettes  . Smokeless tobacco: None  . Alcohol Use: No   OB History    Gravida Para Term Preterm AB TAB SAB Ectopic Multiple Living   1              Review of Systems  All other systems reviewed and are negative.     Allergies  Review of patient's allergies indicates no known allergies.  Home Medications   Prior to Admission medications   Medication Sig Start Date End Date Taking? Authorizing Provider  acetaminophen (TYLENOL) 500 MG tablet Take 1,000 mg by mouth every 6 (six) hours as needed for mild pain, moderate pain or headache.   Yes Historical Provider, MD  cyclobenzaprine (FLEXERIL) 10 MG tablet Take 1 tablet (10 mg total) by mouth 2 (two) times daily as needed for muscle spasms. Patient not taking: Reported on 06/17/2015 02/04/15   Emilia Beck, PA-C  HYDROcodone-acetaminophen (HYCET) 7.5-325 mg/15 ml solution Take 15 mLs by mouth every 4 (four) hours as needed for moderate pain or severe pain. Patient not taking: Reported on 06/03/2014 04/21/14   Tatyana Kirichenko, PA-C  ibuprofen (ADVIL,MOTRIN) 800 MG tablet Take 1 tablet (800 mg total) by mouth 3 (three) times daily. Patient not taking: Reported on 06/17/2015 02/04/15   Emilia Beck, PA-C  LORazepam (ATIVAN) 1 MG tablet Take 0.5 tablets (0.5 mg total) by mouth 3 (three) times daily as needed for anxiety. Patient not taking: Reported on 06/03/2014 04/24/14   Raeford Razor, MD   BP 133/81 mmHg  Pulse 100  Temp(Src) 98 F (36.7 C) (Oral)  Resp 18  SpO2 100% Physical Exam  Constitutional: She is oriented to person, place, and time. She appears well-developed and well-nourished.  HENT:  Right Ear: External ear normal.  Left Ear: External ear normal.  Eyes: Conjunctivae and EOM are normal. Pupils are equal, round, and reactive to light.  Cardiovascular: Normal rate and regular rhythm.   Pulmonary/Chest: Effort normal and breath sounds normal.  Abdominal: Soft. Bowel sounds are normal. There is no tenderness.  Genitourinary:  White discharge. No cmt  Musculoskeletal: Normal range of motion.  Neurological: She is alert and oriented to person, place, and time. She exhibits normal muscle tone. Coordination normal.  Skin: Skin is warm and dry.  Psychiatric: She has a normal mood and affect.  Nursing note and vitals reviewed.   ED Course  Procedures (including critical care time) Labs Review Labs Reviewed  WET PREP, GENITAL - Abnormal; Notable for the following:    Clue Cells Wet Prep HPF POC PRESENT (*)    WBC, Wet Prep HPF POC FEW (*)    All other components within normal limits  COMPREHENSIVE METABOLIC PANEL - Abnormal; Notable for the following:    ALT 12 (*)    All other components within normal limits  URINALYSIS, ROUTINE W REFLEX MICROSCOPIC (NOT [facility]) - Abnormal; Notable for the following:    APPearance CLOUDY (*)    Leukocytes, UA TRACE (*)    All other components within normal limits  URINE MICROSCOPIC-ADD ON - Abnormal; Notable for the following:    Squamous Epithelial / LPF 6-30 (*)    Bacteria, UA FEW (*)    All other components within normal limits  LIPASE, BLOOD  CBC  HIV ANTIBODY (ROUTINE TESTING)  RPR  I-STAT BETA HCG BLOOD, ED ([facility], [facility], [facility] ONLY)  GC/CHLAMYDIA PROBE AMP ([place]) NOT AT [facility]    Imaging Review No results found. I have personally reviewed and evaluated these images and lab results as part of my medical decision-making.   EKG Interpretation None      MDM   Final diagnoses:  BV (bacterial vaginosis)    Labs consistent with bv. Std Culture sent.will treat for flagyl. Discussed safe sex practices with pt    Teressa LowerVrinda Anup Brigham, NP 06/17/15 1340  Lorre NickAnthony Allen, MD 06/21/15 2257

## 2015-06-17 NOTE — Discharge Instructions (Signed)

## 2015-06-17 NOTE — ED Notes (Signed)
PT DISCHARGED. INSTRUCTIONS AND PRESCRIPTION GIVEN. AAOX3. PT IN NO APPARENT DISTRESS. THE OPPORTUNITY TO ASK QUESTIONS WAS PROVIDED. 

## 2015-06-17 NOTE — ED Notes (Signed)
Pt describes abdominal pain x 3-4 days.  Pt has partner that went to jail and he told her to get checked for STD.  Pt does have vaginal discharge.  No burning with urination.  Also c/o headache.  Does make mention that she has not had her depo shot for 4 months.  No n/v

## 2015-06-18 LAB — GC/CHLAMYDIA PROBE AMP (~~LOC~~) NOT AT ARMC
CHLAMYDIA, DNA PROBE: NEGATIVE
Neisseria Gonorrhea: NEGATIVE

## 2015-06-18 LAB — RPR: RPR: NONREACTIVE

## 2015-06-18 LAB — HIV ANTIBODY (ROUTINE TESTING W REFLEX): HIV SCREEN 4TH GENERATION: NONREACTIVE

## 2015-10-12 ENCOUNTER — Encounter (HOSPITAL_COMMUNITY): Payer: Self-pay | Admitting: *Deleted

## 2015-10-12 ENCOUNTER — Emergency Department (HOSPITAL_COMMUNITY)
Admission: EM | Admit: 2015-10-12 | Discharge: 2015-10-12 | Disposition: A | Discharge status: Home / Self Care | Payer: Medicaid Other | Attending: Emergency Medicine | Admitting: Emergency Medicine

## 2015-10-12 DIAGNOSIS — F17210 Nicotine dependence, cigarettes, uncomplicated: Secondary | ICD-10-CM | POA: Insufficient documentation

## 2015-10-12 DIAGNOSIS — Z79899 Other long term (current) drug therapy: Secondary | ICD-10-CM | POA: Insufficient documentation

## 2015-10-12 DIAGNOSIS — F419 Anxiety disorder, unspecified: Secondary | ICD-10-CM | POA: Insufficient documentation

## 2015-10-12 DIAGNOSIS — J020 Streptococcal pharyngitis: Secondary | ICD-10-CM | POA: Insufficient documentation

## 2015-10-12 LAB — RAPID STREP SCREEN (MED CTR MEBANE ONLY): Streptococcus, Group A Screen (Direct): POSITIVE — AB

## 2015-10-12 MED ORDER — IBUPROFEN 600 MG PO TABS: 600.0000 mg | ORAL_TABLET | Freq: Four times a day (QID) | ORAL | Status: DC | PRN

## 2015-10-12 MED ORDER — IBUPROFEN 400 MG PO TABS
800.0000 mg | ORAL_TABLET | Freq: Once | ORAL | Status: AC
  Administered 2015-10-12: 800 mg via ORAL
  Filled 2015-10-12: qty 2

## 2015-10-12 MED ORDER — AMOXICILLIN 500 MG PO CAPS: 500.0000 mg | ORAL_CAPSULE | Freq: Two times a day (BID) | ORAL | Status: DC

## 2015-10-12 MED ORDER — HYDROCODONE-ACETAMINOPHEN 7.5-325 MG/15ML PO SOLN
10.0000 mL | Freq: Four times a day (QID) | ORAL | Status: DC | PRN
Start: 2015-10-12 — End: 2016-03-23

## 2015-10-12 NOTE — ED Notes (Signed)
Patient able to ambulate independently  

## 2015-10-12 NOTE — ED Notes (Signed)
Pt c/o sore throat onset yesterday, pt denies ear pain & cough, pt c/o generalized body aches, pt A&O x4, follows commands, speaks in complete sentences, denies SOB & CP

## 2015-10-12 NOTE — ED Notes (Signed)
Registration at bedside.

## 2015-10-12 NOTE — ED Provider Notes (Signed)
CSN: [id number]     Arrival date & time 10/12/15  1414 History  By signing my name below, I, Tanda Rockers, attest that this documentation has been prepared under the direction and in the presence of [facility], PA-C. Electronically Signed: Tanda Rockers, ED Scribe. 10/12/2015. 4:29 PM.   No chief complaint on file.  The history is provided by the patient. No language interpreter was used.     HPI Comments: VANIYA AUGSPURGER is a 25 y.o. female who presents to the Emergency Department complaining of gradual onset, constant, sore throat that began 1 day ago. Pt also complains of fever with tmax 102, chills, and body aches. Pt has been taking Robitussin with some relief.  Pt is able to tolerate her own secretions. Denies rhinorrhea, nasal congestion, cough, shortness of breath, or any other associated symptoms. Pt denies possibility of pregnancy.   Past Medical History  Diagnosis Date  . Pregnant state, incidental   . Panic attack   . Anxiety    Past Surgical History  Procedure Laterality Date  . Abscess drainage    . Cesarean section     No family history on file. Social History  Substance Use Topics  . Smoking status: Current Every Day Smoker -- 0.50 packs/day    Types: Cigarettes  . Smokeless tobacco: None  . Alcohol Use: No   OB History    Gravida Para Term Preterm AB TAB SAB Ectopic Multiple Living   1              Review of Systems  Constitutional: Positive for fever and chills.  HENT: Positive for sore throat. Negative for congestion, rhinorrhea and trouble swallowing.   Respiratory: Negative for cough, choking, shortness of breath, wheezing and stridor.   Musculoskeletal: Positive for myalgias.  Skin: Negative for rash.  Allergic/Immunologic: Negative for immunocompromised state.  Hematological: Does not bruise/bleed easily.  Psychiatric/Behavioral: Negative for self-injury.   Allergies  Review of patient's allergies indicates no known allergies.  Home  Medications   Prior to Admission medications   Medication Sig Start Date End Date Taking? Authorizing Provider  acetaminophen (TYLENOL) 500 MG tablet Take 1,000 mg by mouth every 6 (six) hours as needed for mild pain, moderate pain or headache.    Historical Provider, MD  cyclobenzaprine (FLEXERIL) 10 MG tablet Take 1 tablet (10 mg total) by mouth 2 (two) times daily as needed for muscle spasms. Patient not taking: Reported on 06/17/2015 02/04/15   Emilia Beck, PA-C  HYDROcodone-acetaminophen (HYCET) 7.5-325 mg/15 ml solution Take 15 mLs by mouth every 4 (four) hours as needed for moderate pain or severe pain. Patient not taking: Reported on 06/03/2014 04/21/14   Tatyana Kirichenko, PA-C  ibuprofen (ADVIL,MOTRIN) 800 MG tablet Take 1 tablet (800 mg total) by mouth 3 (three) times daily. Patient not taking: Reported on 06/17/2015 02/04/15   Emilia Beck, PA-C  LORazepam (ATIVAN) 1 MG tablet Take 0.5 tablets (0.5 mg total) by mouth 3 (three) times daily as needed for anxiety. Patient not taking: Reported on 06/03/2014 04/24/14   Raeford Razor, MD  metroNIDAZOLE (FLAGYL) 500 MG tablet Take 1 tablet (500 mg total) by mouth 2 (two) times daily. 06/17/15   Teressa Lower, NP   BP 121/77 mmHg  Pulse 98  Temp(Src) 98.1 F (36.7 C) (Oral)  Resp 18  Ht  (1.6 m)  SpO2 99%   Physical Exam  Constitutional: She appears well-developed and well-nourished. No distress.  HENT:  Head: Normocephalic and atraumatic.  Mouth/Throat:  Posterior oropharyngeal erythema present. No oropharyngeal exudate or posterior oropharyngeal edema.  Eyes: Conjunctivae are normal.  Neck: Neck supple.  Cardiovascular: Normal rate and regular rhythm.   Pulmonary/Chest: Effort normal and breath sounds normal. No stridor. No respiratory distress. She has no wheezes. She has no rales.  Neurological: She is alert.  Skin: She is not diaphoretic.  Nursing note and vitals reviewed.   ED Course  Procedures (including  critical care time)  DIAGNOSTIC STUDIES: Oxygen Saturation is 99% on RA, normal by my interpretation.    COORDINATION OF CARE: 4:27 PM-Discussed treatment plan which includes Rx antibiotics with pt at bedside and pt agreed to plan.   Labs Review Labs Reviewed  RAPID STREP SCREEN (NOT AT [facility]) - Abnormal; Notable for the following:    Streptococcus, Group A Screen (Direct) POSITIVE (*)    All other components within normal limits    Imaging Review No results found. I have personally reviewed and evaluated these lab results as part of my medical decision-making.   EKG Interpretation None      MDM   Final diagnoses:  Strep pharyngitis  Afebrile, nontoxic patient with sore throat.  Strep screen positive.  No airway concerns.  Doubt peritonsillar abscess.   D/C home with antibiotics, pain medication, PCP follow up.  Discussed result, findings, treatment, and follow up  with patient.  Pt given return precautions.  Pt verbalizes understanding and agrees with plan.       I personally performed the services described in this documentation, which was scribed in my presence. The recorded information has been reviewed and is accurate.      Trixie Dredgemily Algernon Mundie, PA-C 10/22/15 [id number]  Richardean Canalavid H Yao, MD 10/25/15 1007

## 2015-10-12 NOTE — Discharge Instructions (Signed)
Read the information below.  Use the prescribed medication as directed.  Please discuss all new medications with your pharmacist.  Do not take additional tylenol while taking the prescribed pain medication to avoid overdose.  You may return to the Emergency Department at any time for worsening condition or any new symptoms that concern you.    If you develop high fevers, difficulty swallowing or breathing, or you are unable to tolerate fluids by mouth, return to the ER immediately for a recheck.    ° ° °Strep Throat °Strep throat is a bacterial infection of the throat. Your health care provider may call the infection tonsillitis or pharyngitis, depending on whether there is swelling in the tonsils or at the back of the throat. Strep throat is most common during the cold months of the year in children who are 5-15 years of age, but it can happen during any season in people of any age. This infection is spread from person to person (contagious) through coughing, sneezing, or close contact. °CAUSES °Strep throat is caused by the bacteria called Streptococcus pyogenes. °RISK FACTORS °This condition is more likely to develop in: °· People who spend time in crowded places where the infection can spread easily. °· People who have close contact with someone who has strep throat. °SYMPTOMS °Symptoms of this condition include: °· Fever or chills.   °· Redness, swelling, or pain in the tonsils or throat. °· Pain or difficulty when swallowing. °· White or yellow spots on the tonsils or throat. °· Swollen, tender glands in the neck or under the jaw. °· Red rash all over the body (rare). °DIAGNOSIS °This condition is diagnosed by performing a rapid strep test or by taking a swab of your throat (throat culture test). Results from a rapid strep test are usually ready in a few minutes, but throat culture test results are available after one or two days. °TREATMENT °This condition is treated with antibiotic medicine. °HOME CARE  INSTRUCTIONS °Medicines °· Take over-the-counter and prescription medicines only as told by your health care provider. °· Take your antibiotic as told by your health care provider. Do not stop taking the antibiotic even if you start to feel better. °· Have family members who also have a sore throat or fever tested for strep throat. They may need antibiotics if they have the strep infection. °Eating and Drinking °· Do not share food, drinking cups, or personal items that could cause the infection to spread to other people. °· If swallowing is difficult, try eating soft foods until your sore throat feels better. °· Drink enough fluid to keep your urine clear or pale yellow. °General Instructions °· Gargle with a salt-water mixture 3-4 times per day or as needed. To make a salt-water mixture, completely dissolve ½-1 tsp of salt in 1 cup of warm water. °· Make sure that all household members wash their hands well. °· Get plenty of rest. °· Stay home from school or work until you have been taking antibiotics for 24 hours. °· Keep all follow-up visits as told by your health care provider. This is important. °SEEK MEDICAL CARE IF: °· The glands in your neck continue to get bigger. °· You develop a rash, cough, or earache. °· You cough up a thick liquid that is green, yellow-brown, or bloody. °· You have pain or discomfort that does not get better with medicine. °· Your problems seem to be getting worse rather than better. °· You have a fever. °SEEK IMMEDIATE MEDICAL CARE IF: °·   have new symptoms, such as vomiting, severe headache, stiff or painful neck, chest pain, or shortness of breath.  You have severe throat pain, drooling, or changes in your voice.  You have swelling of the neck, or the skin on the neck becomes red and tender.  You have signs of dehydration, such as fatigue, dry mouth, and decreased urination.  You become increasingly sleepy, or you cannot wake up completely.  Your joints become red or  painful.   This information is not intended to replace advice given to you by your health care provider. Make sure you discuss any questions you have with your health care provider.   Document Released: 06/16/2000 Document Revised: 03/10/2015 Document Reviewed: 10/12/2014 Elsevier Interactive Patient Education [employer]2016 Elsevier Inc.

## 2015-10-12 NOTE — ED Notes (Signed)
Pt provided with ginger ale to assess swallowing.  No issues with drinking noted, denies nausea

## 2016-01-18 ENCOUNTER — Emergency Department (HOSPITAL_COMMUNITY)
Admission: EM | Admit: 2016-01-18 | Discharge: 2016-01-18 | Disposition: A | Discharge status: Home / Self Care | Payer: Medicaid Other | Attending: Emergency Medicine | Admitting: Emergency Medicine

## 2016-01-18 ENCOUNTER — Encounter (HOSPITAL_COMMUNITY): Payer: Self-pay | Admitting: Emergency Medicine

## 2016-01-18 DIAGNOSIS — Y990 Civilian activity done for income or pay: Secondary | ICD-10-CM | POA: Insufficient documentation

## 2016-01-18 DIAGNOSIS — Y9389 Activity, other specified: Secondary | ICD-10-CM | POA: Insufficient documentation

## 2016-01-18 DIAGNOSIS — S39013A Strain of muscle, fascia and tendon of pelvis, initial encounter: Secondary | ICD-10-CM

## 2016-01-18 DIAGNOSIS — S76212A Strain of adductor muscle, fascia and tendon of left thigh, initial encounter: Secondary | ICD-10-CM | POA: Insufficient documentation

## 2016-01-18 DIAGNOSIS — F17210 Nicotine dependence, cigarettes, uncomplicated: Secondary | ICD-10-CM | POA: Insufficient documentation

## 2016-01-18 DIAGNOSIS — Y929 Unspecified place or not applicable: Secondary | ICD-10-CM | POA: Insufficient documentation

## 2016-01-18 DIAGNOSIS — X58XXXA Exposure to other specified factors, initial encounter: Secondary | ICD-10-CM | POA: Insufficient documentation

## 2016-01-18 MED ORDER — CYCLOBENZAPRINE HCL 10 MG PO TABS: 10.0000 mg | ORAL_TABLET | Freq: Two times a day (BID) | ORAL | Status: DC | PRN

## 2016-01-18 NOTE — Discharge Instructions (Signed)
For pain control you may take up to 800mg  of ibuprofen (that is usually 4 over the counter pills)  3 times a day (take with food) and acetaminophen 975mg  (this is 3 over the counter pills) four times a day. Do not drink alcohol or combine with other medications that have acetaminophen as an ingredient (Read the labels!).    For breakthrough pain you may take Flexeril. Do not drink alcohol, drive or operate heavy machinery when taking Flexeril.  Do not hesitate to return to the emergency room for any new, worsening or concerning symptoms.  Please obtain primary care using resource guide below. Let them know that you were seen in the emergency room and that they will need to obtain records for further outpatient management.      Muscle Strain A muscle strain is an injury that occurs when a muscle is stretched beyond its normal length. Usually a small number of muscle fibers are torn when this happens. Muscle strain is rated in degrees. First-degree strains have the least amount of muscle fiber tearing and pain. Second-degree and third-degree strains have increasingly more tearing and pain.  Usually, recovery from muscle strain takes 1-2 weeks. Complete healing takes 5-6 weeks.  CAUSES  Muscle strain happens when a sudden, violent force placed on a muscle stretches it too far. This may occur with lifting, sports, or a fall.  RISK FACTORS Muscle strain is especially common in athletes.  SIGNS AND SYMPTOMS At the site of the muscle strain, there may be:  Pain.  Bruising.  Swelling.  Difficulty using the muscle due to pain or lack of normal function. DIAGNOSIS  Your health care provider will perform a physical exam and ask about your medical history. TREATMENT  Often, the best treatment for a muscle strain is resting, icing, and applying cold compresses to the injured area.  HOME CARE INSTRUCTIONS   Use the PRICE method of treatment to promote muscle healing during the first 2-3 days  after your injury. The PRICE method involves:  Protecting the muscle from being injured again.  Restricting your activity and resting the injured body part.  Icing your injury. To do this, put ice in a plastic bag. Place a towel between your skin and the bag. Then, apply the ice and leave it on from 15-20 minutes each hour. After the third day, switch to moist heat packs.  Apply compression to the injured area with a splint or elastic bandage. Be careful not to wrap it too tightly. This may interfere with blood circulation or increase swelling.  Elevate the injured body part above the level of your heart as often as you can.  Only take over-the-counter or prescription medicines for pain, discomfort, or fever as directed by your health care provider.  Warming up prior to exercise helps to prevent future muscle strains. SEEK MEDICAL CARE IF:   You have increasing pain or swelling in the injured area.  You have numbness, tingling, or a significant loss of strength in the injured area. MAKE SURE YOU:   Understand these instructions.  Will watch your condition.  Will get help right away if you are not doing well or get worse.   This information is not intended to replace advice given to you by your health care provider. Make sure you discuss any questions you have with your health care provider.   Document Released: 06/19/2005 Document Revised: 04/09/2013 Document Reviewed: 01/16/2013 Elsevier Interactive Patient Education 2016 [employer]  [employer] Resource Guide Financial Assistance  The [place] Ways 211 is a great source of information about community services available.  Access by dialing 2-1-1 from anywhere in [place], or by website -  PooledIncome.pl.   Other Local Resources (Updated 07/2015)  Financial Assistance   Services    Phone Number and Address  [facility]  Low-cost medical care - 1st and 3rd Saturday of every month  Must not qualify for  public or private insurance and must have limited income [phone] [address] [place], [place]    [place] [employer] of Social Services  Child care  Emergency assistance for housing and [employer]  Medicaid [phone] [address] [place], [place] [address]   [facility]  Low-cost medical care for children, communicable diseases, sexually-transmitted diseases, immunizations, maternity care, womens health and family planning [phone] [address] [place], [place] [address]  [facility]   Medication assistance for [facility] residents  Must meet income requirements [phone] [address] [place], [place].    [facility] Social Services  Child care  Emergency assistance for housing and [employer]  Medicaid [phone] [address] [place], [place] [address]  [facility]   Low-cost medical care,   Monday through Friday, 9 am to 6 pm.   Accepts Medicare/Medicaid, and self-pay [phone] [address] [place], [place] [address]  [facility]  Low-cost medical care - Monday through Friday, 8:30 am - 5:30 pm  Accepts Medicaid and self-pay [phone] [address] [place], [place] [address]   [facility]  Primary medical care, including for those with sickle cell disease  Accepts Medicare, Medicaid, insurance and self-pay [phone] [address] [place], [place]  [facility]   Primary medical care  Accepts Medicare, [place], insurance and self-pay [phone] [address] [place], [place] [address]   [facility] Department of Social Services  Child care  Emergency assistance for housing and [employer]  Medicaid [phone] [address] [place], [place]  [address]  [facility] Department of Health and [employer]  Child care  Emergency assistance for housing and [employer]  Medicaid [phone] [address] [place], [place] [address]   [facility] Medication Assistance Program  Medication assistance for [facility] residents with no insurance only  Must have a primary care doctor [phone] [address] [place], [place]  [facility]   Primary medical care  [place], [place], insurance  [phone] [address] [place], [place]  [facility]   Medication assistance [phone]  [facility]   Primary medical care  Accepts Medicare, [place], insurance and self-pay [phone] [address] [place], [place] [address]  [facility]   Primary medical care  Accepts Medicare, [place], insurance and self-pay [phone] [address] [place], [place] [address]  [facility]  For [place] residents between the ages of 41 and 73 who do not have any form of health insurance, Medicare, [place], or [place] benefits.  Services are provided free of charge to uninsured patients who fall within federal poverty guidelines.    Hours: Tuesdays and Thursdays, 4:15 - 8 pm [phone] [address] [place], [place] [address]  [facility]     Primary medical care  Dental care  Nutritional counseling  Pharmacy  Accepts Medicaid, Medicare, most insurance.  Fees are adjusted based on ability to pay.   [phone] [facility] [address] [place], [place]  [phone] [facility] [address] [place], [place]  [phone] [facility] [place], [place]  [phone] [facility], [address] [place] [address] [place], [place]  [phone] [facility] [address] [place], [place]  [facility]  Womens health and family planning [phone] [address] [place], [place]  [facility] Department of Social Services  Child care  Emergency assistance for housing and [employer]  Medicaid [phone] [address] [place], [place] [address]   [facility]    Ages 72 and older  Hours: Mondays and Thursdays, 7:00 am - 9:00 am Patients are seen on a first come, first served basis. [phone], ext. [phone] [place], [place]  [facility] Division of Social Services  Child care  Emergency assistance for housing and [employer]  Medicaid [phone] [address] [place], [place] [address]  [facility]  Medication assistance  Rental assistance  Food pantry  Medication assistance  Housing assistance  Emergency food distribution  Utility assistance [phone] [address] [place], [place]  [phone]  [address] [place], [place] [address] Hours: Tuesdays and Thursdays from 9am - 12 noon by appointment only  [phone] [address] [place], [place] [address]  [facility] Lanae Boast   Accepts private insurance, [place], and [place].  Payment is based on a sliding scale for those without insurance.  Hours: Mondays, Tuesdays and Thursdays, 8:30 am - 5:30 pm.   [phone] [address] [place], [place]  [facility] - Family Medicine at [facility], [place], and [place].  Payment is based on a sliding scale for those without insurance. [phone] [address] [place], [place]  [facility] - Pediatrics at [address] Scientist, research (physical sciences), [employer], and [place].  Payment is based on a sliding scale for those without insurance [phone] [address], [employer], [place]  [facility] - Pediatrics at [employer], [place], and [place].  Payment is based on a sliding scale for those without insurance. [phone] [address] [place], [place]  [facility] - Pediatrics at [facility], [place], and [place].  Payment is based on a sliding scale for those without insurance. [phone], ext. [phone] [place], [place].    [facility]  Maternity care.  Accepts Medicaid and self-pay. [phone] [address] [place], [place]

## 2016-01-18 NOTE — ED Notes (Signed)
Pt reports L thigh pain since starting job where she works on her feet. No known injury. Also reports a cyst in her L groin

## 2016-01-18 NOTE — ED Notes (Signed)
PT DISCHARGED. INSTRUCTIONS AND PRESCRIPTION GIVEN. AAOX4. PT IN NO APPARENT DISTRESS. THE OPPORTUNITY TO ASK QUESTIONS WAS PROVIDED. 

## 2016-01-18 NOTE — ED Provider Notes (Signed)
CSN: [id number]     Arrival date & time 01/18/16  4098 History   First MD Initiated Contact with Patient 01/18/16 [phone]     Chief Complaint  Patient presents with  . Leg Pain  . Abscess     (Consider location/radiation/quality/duration/timing/severity/associated sxs/prior Treatment) HPI   Blood pressure 119/82, pulse 77, temperature 98.6 F (37 C), resp. rate 16, SpO2 99 %, unknown if currently breastfeeding.  Sabrina Rasmussen is a 25 y.o. female complaining of severe, 10 out of 10 left thigh and left groin pain she also feels like she has a cyst inside her left inguinal area but does not actually physically have anything in the area. She's been using Epsom salts soaks in addition to taking over-the-counter pain medications including Tylenol and ibuprofen with little relief she denies any swelling in the area she recently started a job where she is on her feet for 12 hours a day states that this makes the discomfort in the leg worse.  Past Medical History  Diagnosis Date  . Pregnant state, incidental   . Panic attack   . Anxiety    Past Surgical History  Procedure Laterality Date  . Abscess drainage    . Cesarean section     History reviewed. No pertinent family history. Social History  Substance Use Topics  . Smoking status: Current Every Day Smoker -- 0.50 packs/day    Types: Cigarettes  . Smokeless tobacco: None  . Alcohol Use: No   OB History    Gravida Para Term Preterm AB TAB SAB Ectopic Multiple Living   1              Review of Systems  10 systems reviewed and found to be negative, except as noted in the HPI.   Allergies  Review of patient's allergies indicates no known allergies.  Home Medications   Prior to Admission medications   Medication Sig Start Date End Date Taking? Authorizing Provider  acetaminophen (TYLENOL) 500 MG tablet Take 1,000 mg by mouth every 6 (six) hours as needed for mild pain, moderate pain or headache.    Historical Provider, MD    amoxicillin (AMOXIL) 500 MG capsule Take 1 capsule (500 mg total) by mouth 2 (two) times daily. 10/12/15   Trixie Dredge, PA-C  cyclobenzaprine (FLEXERIL) 10 MG tablet Take 1 tablet (10 mg total) by mouth 2 (two) times daily as needed for muscle spasms. Patient not taking: Reported on 06/17/2015 02/04/15   Emilia Beck, PA-C  HYDROcodone-acetaminophen (HYCET) 7.5-325 mg/15 ml solution Take 10 mLs by mouth 4 (four) times daily as needed for moderate pain or severe pain. 10/12/15   Trixie Dredge, PA-C  ibuprofen (ADVIL,MOTRIN) 600 MG tablet Take 1 tablet (600 mg total) by mouth every 6 (six) hours as needed for mild pain or moderate pain. 10/12/15   Trixie Dredge, PA-C  LORazepam (ATIVAN) 1 MG tablet Take 0.5 tablets (0.5 mg total) by mouth 3 (three) times daily as needed for anxiety. Patient not taking: Reported on 06/03/2014 04/24/14   Raeford Razor, MD  metroNIDAZOLE (FLAGYL) 500 MG tablet Take 1 tablet (500 mg total) by mouth 2 (two) times daily. 06/17/15   Teressa Lower, NP   BP 119/82 mmHg  Pulse 77  Temp(Src) 98.6 F (37 C)  Resp 16  SpO2 99% Physical Exam  Constitutional: She is oriented to person, place, and time. She appears well-developed and well-nourished. No distress.  HENT:  Head: Normocephalic and atraumatic.  Mouth/Throat: Oropharynx is clear and moist.  Eyes: Conjunctivae and EOM are normal. Pupils are equal, round, and reactive to light.  Neck: Normal range of motion.  Cardiovascular: Normal rate, regular rhythm and intact distal pulses.   Pulmonary/Chest: Effort normal and breath sounds normal.  Abdominal: Soft. There is no tenderness.  Musculoskeletal: Normal range of motion.       Legs: Tender to palpation along the left medial aspect of the thigh, no edema, compartments are soft, distally neurovascularly intact. There is no lesion or lymphadenopathy in the left inguinal area. She also tender to palpation in this area. Full active range of motion to hip, knee and ankle.   Neurological: She is alert and oriented to person, place, and time.  Skin: She is not diaphoretic.  Psychiatric: She has a normal mood and affect.  Nursing note and vitals reviewed.   ED Course  Procedures (including critical care time) Labs Review Labs Reviewed - No data to display  Imaging Review No results found. I have personally reviewed and evaluated these images and lab results as part of my medical decision-making.   EKG Interpretation None      MDM   Final diagnoses:  Inguinal muscle strain, initial encounter    Filed Vitals:   01/18/16 0746  BP: 119/82  Pulse: 77  Temp: 98.6 F (37 C)  Resp: 16  SpO2: 99%     Sabrina Rasmussen is 25 y.o. female presenting with Atraumatic pain to left medial thigh. No swelling consistent with DVT, compartments are soft. Pain is exacerbated after she stands all day for work. She has inguinal discomfort with no abnormality on physical exam, likely muscle strain. Patient is given a muscle relaxer and work note.  Evaluation does not show pathology that would require ongoing emergent intervention or inpatient treatment. Pt is hemodynamically stable and mentating appropriately. Discussed findings and plan with patient/guardian, who agrees with care plan. All questions answered. Return precautions discussed and outpatient follow up given.   New Prescriptions   CYCLOBENZAPRINE (FLEXERIL) 10 MG TABLET    Take 1 tablet (10 mg total) by mouth 2 (two) times daily as needed for muscle spasms.         Joni Reiningicole Cherrish Vitali, PA-C 01/18/16 [id number]   Arby BarretteMarcy Pfeiffer, MD 02/01/16 [phone]

## 2016-02-23 ENCOUNTER — Encounter (HOSPITAL_COMMUNITY): Payer: Self-pay

## 2016-02-23 ENCOUNTER — Emergency Department (HOSPITAL_COMMUNITY)
Admission: EM | Admit: 2016-02-23 | Discharge: 2016-02-23 | Disposition: A | Discharge status: Home / Self Care | Payer: Medicaid Other | Attending: Emergency Medicine | Admitting: Emergency Medicine

## 2016-02-23 DIAGNOSIS — G43809 Other migraine, not intractable, without status migrainosus: Secondary | ICD-10-CM | POA: Insufficient documentation

## 2016-02-23 DIAGNOSIS — F17210 Nicotine dependence, cigarettes, uncomplicated: Secondary | ICD-10-CM | POA: Insufficient documentation

## 2016-02-23 LAB — BASIC METABOLIC PANEL
ANION GAP: 6 (ref 5–15)
BUN: 12 mg/dL (ref 6–20)
CALCIUM: 9.2 mg/dL (ref 8.9–10.3)
CO2: 25 mmol/L (ref 22–32)
Chloride: 109 mmol/L (ref 101–111)
Creatinine, Ser: 0.86 mg/dL (ref 0.44–1.00)
GFR calc Af Amer: >60 mL/min (ref >60)
GLUCOSE: 92 mg/dL (ref 65–99)
Potassium: 3.7 mmol/L (ref 3.5–5.1)
Sodium: 140 mmol/L (ref 135–145)

## 2016-02-23 LAB — CBC WITH DIFFERENTIAL/PLATELET
BASOS ABS: 0 10*3/uL (ref 0.0–0.1)
Basophils Relative: 0 %
EOS PCT: 1 %
Eosinophils Absolute: 0.1 10*3/uL (ref 0.0–0.7)
HEMATOCRIT: 36.6 % (ref 36.0–46.0)
Hemoglobin: 12.1 g/dL (ref 12.0–15.0)
LYMPHS ABS: 1.9 10*3/uL (ref 0.7–4.0)
LYMPHS PCT: 31 %
MCH: 31.3 pg (ref 26.0–34.0)
MCHC: 33.1 g/dL (ref 30.0–36.0)
MCV: 94.8 fL (ref 78.0–100.0)
MONO ABS: 0.3 10*3/uL (ref 0.1–1.0)
MONOS PCT: 6 %
NEUTROS ABS: 3.8 10*3/uL (ref 1.7–7.7)
Neutrophils Relative %: 62 %
PLATELETS: 269 10*3/uL (ref 150–400)
RBC: 3.86 MIL/uL — ABNORMAL LOW (ref 3.87–5.11)
RDW: 14.4 % (ref 11.5–15.5)
WBC: 6.2 10*3/uL (ref 4.0–10.5)

## 2016-02-23 MED ORDER — PROCHLORPERAZINE EDISYLATE 5 MG/ML IJ SOLN
10.0000 mg | Freq: Once | INTRAMUSCULAR | Status: AC
  Administered 2016-02-23: 10 mg via INTRAVENOUS
  Filled 2016-02-23: qty 2

## 2016-02-23 MED ORDER — SODIUM CHLORIDE 0.9 % IV BOLUS (SEPSIS)
1000.0000 mL | Freq: Once | INTRAVENOUS | Status: AC
  Administered 2016-02-23: 1000 mL via INTRAVENOUS

## 2016-02-23 MED ORDER — KETOROLAC TROMETHAMINE 30 MG/ML IJ SOLN
30.0000 mg | Freq: Once | INTRAMUSCULAR | Status: AC
  Administered 2016-02-23: 30 mg via INTRAVENOUS
  Filled 2016-02-23: qty 1

## 2016-02-23 MED ORDER — DIPHENHYDRAMINE HCL 50 MG/ML IJ SOLN
25.0000 mg | Freq: Once | INTRAMUSCULAR | Status: AC
  Administered 2016-02-23: 25 mg via INTRAVENOUS
  Filled 2016-02-23: qty 1

## 2016-02-23 NOTE — ED Provider Notes (Signed)
[facility]-EMERGENCY DEPT Provider Note   CSN: [id number] Arrival date & time: 02/23/16  1256     History   Chief Complaint Chief Complaint  Patient presents with  . Migraine    HPI Sabrina Rasmussen is a 25 y.o. female.  Pt is a 25 yo female with PMH of anxiety who presents to the ED with complaint of headache, onset 3 days. Pt reports having a gradual onset sharp headache to her temporal and frontal region that started 2 days ago. She notes the headache has remained constant over the past few days. Denies hx of migraines. Endorses associated photophobia and lightheadedness. She also reports this morning having abdominal pain with nausea and 2 episodes of NBNB vomiting which have since resolved. She also reports having blurred vision this morning that lasted appx 1 hour. Pt denies fever, neck stiffness, urinary symptoms, numbness, tingling, weakness, seizures, syncope. She reports taking tylenol, tylenol PM, ibuprofen and advil without relief.        Past Medical History:  Diagnosis Date  . Anxiety   . Panic attack   . Pregnant state, incidental     There are no active problems to display for this patient.   Past Surgical History:  Procedure Laterality Date  . ABSCESS DRAINAGE    . CESAREAN SECTION      OB History    Gravida Para Term Preterm AB Living   1             SAB TAB Ectopic Multiple Live Births                   Home Medications    Prior to Admission medications   Medication Sig Start Date End Date Taking? Authorizing Provider  acetaminophen (TYLENOL) 500 MG tablet Take 1,000 mg by mouth every 6 (six) hours as needed for mild pain, moderate pain or headache.    Historical Provider, MD  amoxicillin (AMOXIL) 500 MG capsule Take 1 capsule (500 mg total) by mouth 2 (two) times daily. 10/12/15   Trixie DredgeEmily West, PA-C  cyclobenzaprine (FLEXERIL) 10 MG tablet Take 1 tablet (10 mg total) by mouth 2 (two) times daily as needed for muscle spasms. 01/18/16   Eisha Chatterjee Pisciotta,  PA-C  HYDROcodone-acetaminophen (HYCET) 7.5-325 mg/15 ml solution Take 10 mLs by mouth 4 (four) times daily as needed for moderate pain or severe pain. 10/12/15   Trixie DredgeEmily West, PA-C  ibuprofen (ADVIL,MOTRIN) 600 MG tablet Take 1 tablet (600 mg total) by mouth every 6 (six) hours as needed for mild pain or moderate pain. 10/12/15   Trixie DredgeEmily West, PA-C  LORazepam (ATIVAN) 1 MG tablet Take 0.5 tablets (0.5 mg total) by mouth 3 (three) times daily as needed for anxiety. Patient not taking: Reported on 06/03/2014 04/24/14   Raeford RazorStephen Kohut, MD  metroNIDAZOLE (FLAGYL) 500 MG tablet Take 1 tablet (500 mg total) by mouth 2 (two) times daily. 06/17/15   Teressa LowerVrinda Pickering, NP    Family History No family history on file.  Social History Social History  Substance Use Topics  . Smoking status: Current Every Day Smoker    Packs/day: 0.50    Types: Cigarettes  . Smokeless tobacco: Never Used  . Alcohol use No     Allergies   Review of patient's allergies indicates no known allergies.   Review of Systems Review of Systems  Constitutional:       Generalized malaise  Eyes: Positive for photophobia and visual disturbance (blurred).  Gastrointestinal: Positive for abdominal pain, diarrhea,  nausea and vomiting.  Neurological: Positive for light-headedness and headaches.     Physical Exam Updated Vital Signs BP 109/69 (BP Location: Right Arm)   Pulse 96   Temp 98.2 F (36.8 C) (Oral)   Resp 20   Ht 5\' 3"  (1.6 m)   Wt 59 kg   SpO2 100%   BMI 23.03 kg/m   Physical Exam  Constitutional: She is oriented to person, place, and time. She appears well-developed and well-nourished. No distress.  HENT:  Head: Normocephalic and atraumatic.  Right Ear: Tympanic membrane normal.  Left Ear: Tympanic membrane normal.  Nose: Nose normal. Right sinus exhibits no maxillary sinus tenderness and no frontal sinus tenderness. Left sinus exhibits no maxillary sinus tenderness and no frontal sinus tenderness.    Mouth/Throat: Uvula is midline, oropharynx is clear and moist and mucous membranes are normal. No oropharyngeal exudate, posterior oropharyngeal edema, posterior oropharyngeal erythema or tonsillar abscesses.  Eyes: Conjunctivae and EOM are normal. Right eye exhibits no discharge. Left eye exhibits no discharge. No scleral icterus.  Neck: Normal range of motion. Neck supple.  Cardiovascular: Normal rate, regular rhythm, normal heart sounds and intact distal pulses.   Pulmonary/Chest: Effort normal and breath sounds normal. No respiratory distress. She has no wheezes. She has no rales. She exhibits no tenderness.  Abdominal: Soft. Bowel sounds are normal. She exhibits no distension and no mass. There is no tenderness. There is no rebound and no guarding. No hernia.  Musculoskeletal: Normal range of motion. She exhibits no edema.  Neurological: She is alert and oriented to person, place, and time. No cranial nerve deficit. Coordination normal.  Skin: Skin is warm and dry. Capillary refill takes less than 2 seconds. She is not diaphoretic.  Nursing note and vitals reviewed.    ED Treatments / Results  Labs (all labs ordered are listed, but only abnormal results are displayed) Labs Reviewed  CBC WITH DIFFERENTIAL/PLATELET - Abnormal; Notable for the following:       Result Value   RBC 3.86 (*)    All other components within normal limits  BASIC METABOLIC PANEL    EKG  EKG Interpretation None       Radiology No results found.  Procedures Procedures (including critical care time)  Medications Ordered in ED Medications  sodium chloride 0.9 % bolus 1,000 mL (0 mLs Intravenous Stopped 02/23/16 1921)  diphenhydrAMINE (BENADRYL) injection 25 mg (25 mg Intravenous Given 02/23/16 1654)  prochlorperazine (COMPAZINE) injection 10 mg (10 mg Intravenous Given 02/23/16 1706)  ketorolac (TORADOL) 30 MG/ML injection 30 mg (30 mg Intravenous Given 02/23/16 1654)     Initial Impression /  Assessment and Plan / ED Course  I have reviewed the triage vital signs and the nursing notes.  Pertinent labs & imaging results that were available during my care of the patient were reviewed by me and considered in my medical decision making (see chart for details).  Clinical Course   Pt presents with HA for the past 3 days with associated intermittent blurred vision, photophobia, N/V/D, abdominal pain and lightheadedness. No relief with OTC meds. Denies hx of migraines. VSS. Exam unremarkable. No neuro deficits. Pt given IVF and migraine cocktail. Labs unremarkable. On reevaluation, pt is resting comfortably while laying in bed. She notes her headache has improved. Pt able to tolerate PO. I do not suspect SAH, ICH, intracranial lesion, meningitis, encephalopathy or encephalitis and do not think that cranial imaging is needed at this time.   On reevaluation pt reports her  headache has resolved and she is ready to go home. Pt able to tolerate PO in the ED. Plan to d/c pt home with symptomatic tx and PCP follow up. Discussed return precautions with pt.   Final Clinical Impressions(s) / ED Diagnoses   Final diagnoses:  Other migraine without status migrainosus, not intractable    New Prescriptions Discharge Medication List as of 02/23/2016  7:22 PM       Satira Sarkicole Elizabeth GarlandNadeau, PA-C 02/23/16 2310    Alvira MondayErin Schlossman, MD 02/24/16 [phone]

## 2016-02-23 NOTE — Discharge Instructions (Signed)
You may continue taking Tylenol and/or ibuprofen as prescribed over-the-counter as needed for pain relief. I recommend continuing to drink water at home to remain hydrated. Please follow up with a primary care provider from the Resource Guide provided below in 1 week as needed. Please return to the Emergency Department if symptoms worsen or new onset of fever, neck stiffness, visual changes, photophobia, abdominal pain, vomiting, urinary symptoms, numbness, tingling, weakness, seizures, syncope.

## 2016-02-23 NOTE — ED Triage Notes (Signed)
Per pT, Pt is coming from home with complaints of headache lasting for two days that has gotten any better. Pt reports N/V, blurred vision with overall body aches. Denies fever. Pt is tearful in triage.

## 2016-03-23 ENCOUNTER — Encounter (HOSPITAL_COMMUNITY): Payer: Self-pay

## 2016-03-23 ENCOUNTER — Emergency Department (HOSPITAL_COMMUNITY)
Admission: EM | Admit: 2016-03-23 | Discharge: 2016-03-23 | Disposition: A | Discharge status: Home / Self Care | Payer: Medicaid Other | Attending: Emergency Medicine | Admitting: Emergency Medicine

## 2016-03-23 ENCOUNTER — Emergency Department (HOSPITAL_COMMUNITY): Payer: Medicaid Other

## 2016-03-23 DIAGNOSIS — Y929 Unspecified place or not applicable: Secondary | ICD-10-CM | POA: Insufficient documentation

## 2016-03-23 DIAGNOSIS — M20012 Mallet finger of left finger(s): Secondary | ICD-10-CM | POA: Insufficient documentation

## 2016-03-23 DIAGNOSIS — S9031XA Contusion of right foot, initial encounter: Secondary | ICD-10-CM | POA: Insufficient documentation

## 2016-03-23 DIAGNOSIS — F17210 Nicotine dependence, cigarettes, uncomplicated: Secondary | ICD-10-CM | POA: Insufficient documentation

## 2016-03-23 DIAGNOSIS — Y939 Activity, unspecified: Secondary | ICD-10-CM | POA: Insufficient documentation

## 2016-03-23 DIAGNOSIS — Y990 Civilian activity done for income or pay: Secondary | ICD-10-CM | POA: Insufficient documentation

## 2016-03-23 DIAGNOSIS — W208XXA Other cause of strike by thrown, projected or falling object, initial encounter: Secondary | ICD-10-CM | POA: Insufficient documentation

## 2016-03-23 MED ORDER — IBUPROFEN 200 MG PO TABS
400.0000 mg | ORAL_TABLET | Freq: Once | ORAL | Status: AC | PRN
  Administered 2016-03-23: 400 mg via ORAL
  Filled 2016-03-23: qty 2

## 2016-03-23 MED ORDER — ACETAMINOPHEN-CODEINE #3 300-30 MG PO TABS: 1.0000 | ORAL_TABLET | Freq: Four times a day (QID) | ORAL | 0 refills | Status: DC | PRN

## 2016-03-23 NOTE — Discharge Instructions (Signed)
Read the information below.  Use the prescribed medication as directed.  Please discuss all new medications with your pharmacist.  You may return to the Emergency Department at any time for worsening condition or any new symptoms that concern you.   If you develop uncontrolled pain, weakness or numbness of the extremity, severe discoloration of the skin, or you are unable to move your finger or hand, return to the ER for a recheck.

## 2016-03-23 NOTE — ED Triage Notes (Signed)
Pt c/o L 5th digit injury x 2 days ago.  Pain score 9/10.  Pt reports taking OTC medication w/o relief.  Deformity noted.  Pt reports "dropping a trolley" on her finger.

## 2016-03-23 NOTE — ED Provider Notes (Signed)
[facility]-EMERGENCY DEPT Provider Note   CSN: [id number] Arrival date & time: 03/23/16  1151  By signing my name below, I, Octavia Heir, attest that this documentation has been prepared under the direction and in the presence of [facility], PA-C.  Electronically Signed: Octavia Heir, ED Scribe. 03/23/16. 1:53 PM.    History   Chief Complaint Chief Complaint  Patient presents with  . Finger Injury    The history is provided by the patient. No language interpreter was used.   HPI Comments: MEG NIEMEIER is a 25 y.o. female who presents to the Emergency Department complaining of a sudden onset, gradual worsening, moderate left 5th digit injury onset two days ago. There is deformity noted. Associated numbness expressed. Pt says she was at work two days ago when she dropped a metal trolley on her left 5th digit of her hand and her right foot. Pt has slight bruising and tenderness to her right foot but is able to ambulate without difficulty. She has taken ibuprofen to alleviate her pain with no relief. No other injury noted.  Past Medical History:  Diagnosis Date  . Anxiety   . Panic attack   . Pregnant state, incidental     There are no active problems to display for this patient.   Past Surgical History:  Procedure Laterality Date  . ABSCESS DRAINAGE    . CESAREAN SECTION      OB History    Gravida Para Term Preterm AB Living   1             SAB TAB Ectopic Multiple Live Births                   Home Medications    Prior to Admission medications   Medication Sig Start Date End Date Taking? Authorizing Provider  acetaminophen (TYLENOL) 500 MG tablet Take 1,000 mg by mouth every 6 (six) hours as needed for mild pain, moderate pain or headache.    Historical Provider, MD  acetaminophen-codeine (TYLENOL #3) 300-30 MG tablet Take 1-2 tablets by mouth every 6 (six) hours as needed for moderate pain. 03/23/16   Trixie Dredge, PA-C  amoxicillin (AMOXIL) 500 MG capsule Take 1  capsule (500 mg total) by mouth 2 (two) times daily. 10/12/15   Trixie Dredge, PA-C  LORazepam (ATIVAN) 1 MG tablet Take 0.5 tablets (0.5 mg total) by mouth 3 (three) times daily as needed for anxiety. Patient not taking: Reported on 06/03/2014 04/24/14   Raeford Razor, MD  metroNIDAZOLE (FLAGYL) 500 MG tablet Take 1 tablet (500 mg total) by mouth 2 (two) times daily. 06/17/15   Teressa Lower, NP    Family History History reviewed. No pertinent family history.  Social History Social History  Substance Use Topics  . Smoking status: Current Every Day Smoker    Packs/day: 0.50    Types: Cigarettes  . Smokeless tobacco: Never Used  . Alcohol use No     Allergies   Review of patient's allergies indicates no known allergies.   Review of Systems Review of Systems  Constitutional: Negative for chills and fever.  Musculoskeletal: Positive for joint swelling.  Skin: Positive for color change. Negative for pallor and wound.  Allergic/Immunologic: Negative for immunocompromised state.  Neurological: Positive for numbness.  Hematological: Does not bruise/bleed easily.  Psychiatric/Behavioral: Negative for self-injury.     Physical Exam Updated Vital Signs BP 111/74 (BP Location: Right Arm)   Pulse 90   Temp 98 F (36.7 C) (Oral)  Resp 14   Ht 5\' 3"  (1.6 m)   SpO2 98%   Physical Exam  Constitutional: She appears well-developed and well-nourished. No distress.  HENT:  Head: Normocephalic and atraumatic.  Neck: Neck supple.  Pulmonary/Chest: Effort normal.  Musculoskeletal: She exhibits edema, tenderness and deformity.  Left 5th digit with edema and tenderness over the DIP joint. No break in skin. Slight decrease in full extension in PIP unable to extend DIP. Cap refill <2 sec. Sensation subjectively altered but intact. No other focal tenderness of the hand. Full ROM at the 5th MCP and other fingers. Slight ecchymosis over right foot great toe, no bony tenderness, no break in  skin.  Neurological: She is alert.  Skin: She is not diaphoretic.  Nursing note and vitals reviewed.    ED Treatments / Results  DIAGNOSTIC STUDIES: Oxygen Saturation is 98% on RA, normal by my interpretation.  COORDINATION OF CARE:  1:47 PM Discussed treatment plan with pt at bedside and pt agreed to plan.  Labs (all labs ordered are listed, but only abnormal results are displayed) Labs Reviewed - No data to display  EKG  EKG Interpretation None       Radiology Dg Finger Little Left  Result Date: 03/23/2016 CLINICAL DATA:  The patient dropped a metal trolley onto the left little finger 2 days ago. Pain. Initial encounter. EXAM: LEFT LITTLE FINGER 2+V COMPARISON:  None. FINDINGS: The DIP joint is in flexion. Soft tissue swelling is seen about the dorsal margin of the joint. A bone fragment measuring 0.2 cm long is seen in the dorsal soft tissues just proximal to the DIP joint. No donor site is identified. There is mild ulnar angulation of the PIP joint. No dislocation. IMPRESSION: Mallet finger left little finger. Bone fragment over the dorsal margin of the head of the middle phalanx left little finger could be in the extensor tendon due to remote injury. Avulsion fracture is possible but no donor site is identified. Mild ulnar deviation of the PIP joint of the little finger of uncertain clinical significance. Electronically Signed   By: Drusilla Kanner M.D.   On: 03/23/2016 13:25    Procedures Procedures (including critical care time)  Medications Ordered in ED Medications  ibuprofen (ADVIL,MOTRIN) tablet 400 mg (400 mg Oral Given 03/23/16 1315)     Initial Impression / Assessment and Plan / ED Course  I have reviewed the triage vital signs and the nursing notes.  Pertinent labs & imaging results that were available during my care of the patient were reviewed by me and considered in my medical decision making (see chart for details).  Clinical Course   Afebrile,  nontoxic patient with injury to her left 5th finger while at work, dropped object on end of finger.   Xray demonstrates mallet finger.  This occurred two days ago.  Wound is closed.   D/C home with finger splint, hand follow up Merlyn Lot).  Discussed result, findings, treatment, and follow up  with patient.  Pt given return precautions.  Pt verbalizes understanding and agrees with plan.      I personally performed the services described in this documentation, which was scribed in my presence. The recorded information has been reviewed and is accurate.  Final Clinical Impressions(s) / ED Diagnoses   Final diagnoses:  Mallet finger of left hand    New Prescriptions Discharge Medication List as of 03/23/2016  1:53 PM    START taking these medications   Details  acetaminophen-codeine (TYLENOL #3) 300-30  MG tablet Take 1-2 tablets by mouth every 6 (six) hours as needed for moderate pain., Starting Thu 03/23/2016, Print         [place]aledoniaEmily Chevy Virgo, PA-C 03/23/16 1424    Canary Brimhristopher J Tegeler, MD 03/24/16 [phone]

## 2016-04-03 ENCOUNTER — Encounter (HOSPITAL_COMMUNITY): Payer: Self-pay | Admitting: Emergency Medicine

## 2016-04-03 ENCOUNTER — Emergency Department (HOSPITAL_COMMUNITY)
Admission: EM | Admit: 2016-04-03 | Discharge: 2016-04-03 | Disposition: A | Discharge status: Home / Self Care | Payer: Medicaid Other | Attending: Emergency Medicine | Admitting: Emergency Medicine

## 2016-04-03 DIAGNOSIS — F17210 Nicotine dependence, cigarettes, uncomplicated: Secondary | ICD-10-CM | POA: Insufficient documentation

## 2016-04-03 DIAGNOSIS — M20012 Mallet finger of left finger(s): Secondary | ICD-10-CM | POA: Insufficient documentation

## 2016-04-03 NOTE — ED Provider Notes (Signed)
[facility]-EMERGENCY DEPT Provider Note   CSN: [id number] Arrival date & time: 04/03/16  4098     History   Chief Complaint Chief Complaint  Patient presents with  . Hand Pain    HPI Sabrina Rasmussen is a 25 y.o. female.  HPI Patient presents to emergency department with ongoing discomfort and pain in her left little finger.  She was diagnosed with a mallet finger after an injury on 03/23/2016.  She was unable to follow-up with orthopedic hand surgery because of a work commitment.  She is scheduled to see hand surgery tomorrow but is requesting a work note so that she can be available for her hand surgical appointment tomorrow with Dr. Merlyn Lot.  She presents today with her left little finger not in a splint.   Past Medical History:  Diagnosis Date  . Anxiety   . Panic attack   . Pregnant state, incidental     There are no active problems to display for this patient.   Past Surgical History:  Procedure Laterality Date  . ABSCESS DRAINAGE    . CESAREAN SECTION      OB History    Gravida Para Term Preterm AB Living   1             SAB TAB Ectopic Multiple Live Births                   Home Medications    Prior to Admission medications   Medication Sig Start Date End Date Taking? Authorizing Provider  acetaminophen (TYLENOL) 500 MG tablet Take 1,000 mg by mouth every 6 (six) hours as needed for mild pain, moderate pain or headache.    Historical Provider, MD  acetaminophen-codeine (TYLENOL #3) 300-30 MG tablet Take 1-2 tablets by mouth every 6 (six) hours as needed for moderate pain. 03/23/16   Trixie Dredge, PA-C  amoxicillin (AMOXIL) 500 MG capsule Take 1 capsule (500 mg total) by mouth 2 (two) times daily. 10/12/15   Trixie Dredge, PA-C  LORazepam (ATIVAN) 1 MG tablet Take 0.5 tablets (0.5 mg total) by mouth 3 (three) times daily as needed for anxiety. Patient not taking: Reported on 06/03/2014 04/24/14   Raeford Razor, MD  metroNIDAZOLE (FLAGYL) 500 MG tablet Take 1 tablet (500  mg total) by mouth 2 (two) times daily. 06/17/15   Teressa Lower, NP    Family History No family history on file.  Social History Social History  Substance Use Topics  . Smoking status: Current Every Day Smoker    Packs/day: 0.50    Types: Cigarettes  . Smokeless tobacco: Never Used  . Alcohol use No     Allergies   Review of patient's allergies indicates no known allergies.   Review of Systems Review of Systems  All other systems reviewed and are negative.    Physical Exam Updated Vital Signs BP 108/97 (BP Location: Right Arm)   Pulse 80   Temp 98.1 F (36.7 C) (Oral)   Resp 17   SpO2 100%   Physical Exam  Constitutional: She is oriented to person, place, and time. She appears well-developed and well-nourished.  HENT:  Head: Normocephalic.  Eyes: EOM are normal.  Neck: Normal range of motion.  Pulmonary/Chest: Effort normal.  Abdominal: She exhibits no distension.  Musculoskeletal: Normal range of motion.  Left little finger consistent with mallet finger.  She has no ability to extend the distal phalanx of her left little finger  Neurological: She is alert and oriented to person,  place, and time.  Psychiatric: She has a normal mood and affect.  Nursing note and vitals reviewed.    ED Treatments / Results  Labs (all labs ordered are listed, but only abnormal results are displayed) Labs Reviewed - No data to display  EKG  EKG Interpretation None       Radiology No results found.  Procedures Procedures (including critical care time)  Medications Ordered in ED Medications - No data to display   Initial Impression / Assessment and Plan / ED Course  I have reviewed the triage vital signs and the nursing notes.  Pertinent labs & imaging results that were available during my care of the patient were reviewed by me and considered in my medical decision making (see chart for details).  Clinical Course    Mallet finger.  Placed back in a  splint and told that she will need to keep the splint on.  We can surgery scheduled for tomorrow.  Work note given  Final Clinical Impressions(s) / ED Diagnoses   Final diagnoses:  Mallet finger of left finger(s)    New Prescriptions New Prescriptions   No medications on file     Azalia BilisKevin Emmalee Solivan, MD 04/03/16 [phone]

## 2016-04-03 NOTE — ED Notes (Signed)
Patient is alert and oriented x3.  She was given DC instructions and follow up visit instructions.  Patient gave verbal understanding. She was DC ambulatory under her own power to home.  V/S stable.  He was not showing any signs of distress on DC 

## 2016-04-03 NOTE — ED Triage Notes (Signed)
Patient states that she was seen here on 21st for left 5th finger injury and hasnt been able to get in to orthopedic.  Patient states that pain is still severe and prescription meds arent helping. Patient still not able to move finger without assistance.

## 2016-04-25 ENCOUNTER — Encounter (HOSPITAL_COMMUNITY): Payer: Self-pay

## 2016-04-25 ENCOUNTER — Emergency Department (HOSPITAL_COMMUNITY): Payer: Self-pay

## 2016-04-25 ENCOUNTER — Emergency Department (HOSPITAL_COMMUNITY)
Admission: EM | Admit: 2016-04-25 | Discharge: 2016-04-25 | Disposition: A | Discharge status: Home / Self Care | Payer: Self-pay | Attending: Emergency Medicine | Admitting: Emergency Medicine

## 2016-04-25 DIAGNOSIS — F419 Anxiety disorder, unspecified: Secondary | ICD-10-CM

## 2016-04-25 DIAGNOSIS — F17210 Nicotine dependence, cigarettes, uncomplicated: Secondary | ICD-10-CM | POA: Insufficient documentation

## 2016-04-25 DIAGNOSIS — R0789 Other chest pain: Secondary | ICD-10-CM

## 2016-04-25 LAB — COMPREHENSIVE METABOLIC PANEL
ALT: 14 U/L (ref 14–54)
AST: 20 U/L (ref 15–41)
Albumin: 4.2 g/dL (ref 3.5–5.0)
Alkaline Phosphatase: 57 U/L (ref 38–126)
Anion gap: 6 (ref 5–15)
BUN: 7 mg/dL (ref 6–20)
CHLORIDE: 106 mmol/L (ref 101–111)
CO2: 25 mmol/L (ref 22–32)
Calcium: 9.5 mg/dL (ref 8.9–10.3)
Creatinine, Ser: 0.75 mg/dL (ref 0.44–1.00)
Glucose, Bld: 92 mg/dL (ref 65–99)
POTASSIUM: 4.1 mmol/L (ref 3.5–5.1)
SODIUM: 137 mmol/L (ref 135–145)
Total Bilirubin: 0.6 mg/dL (ref 0.3–1.2)
Total Protein: 7.2 g/dL (ref 6.5–8.1)

## 2016-04-25 LAB — I-STAT TROPONIN, ED: TROPONIN I, POC: 0 ng/mL (ref 0.00–0.08)

## 2016-04-25 LAB — CBC
HCT: 39.3 % (ref 36.0–46.0)
Hemoglobin: 13.4 g/dL (ref 12.0–15.0)
MCH: 32.4 pg (ref 26.0–34.0)
MCHC: 34.1 g/dL (ref 30.0–36.0)
MCV: 94.9 fL (ref 78.0–100.0)
PLATELETS: 263 10*3/uL (ref 150–400)
RBC: 4.14 MIL/uL (ref 3.87–5.11)
RDW: 14.2 % (ref 11.5–15.5)
WBC: 4.6 10*3/uL (ref 4.0–10.5)

## 2016-04-25 MED ORDER — LORAZEPAM 2 MG/ML IJ SOLN
0.5000 mg | Freq: Once | INTRAMUSCULAR | Status: AC
  Administered 2016-04-25: 0.5 mg via INTRAVENOUS
  Filled 2016-04-25: qty 1

## 2016-04-25 MED ORDER — HYDROXYZINE HCL 25 MG PO TABS: 25.0000 mg | ORAL_TABLET | Freq: Four times a day (QID) | ORAL | 0 refills | Status: DC

## 2016-04-25 NOTE — ED Provider Notes (Signed)
[facility]-EMERGENCY DEPT Provider Note   CSN: [id number] Arrival date & time: 04/25/16  4098     History   Chief Complaint Chief Complaint  Patient presents with  . Chest Pain    HPI Sabrina Rasmussen is a 25 y.o. female with a past medical history of anxiety who presents to the ED today complaining of chest pain. Patient states that over the last week she was having intermittent sharp pains in the left side of her chest that did not radiate. Patient states that she woke up this morning and felt sick to her stomach, subsequently began vomiting. After this patient states the pain in her chest became significantly worse and has remained constant. She took 2 ibuprofen which has improved her pain significantly. Pain is worsened with deep inspiration. She denies any shortness of breath, diaphoresis, dizziness, syncope or fever or cough. Patient reports history of similar symptoms and states that at that time she was having a panic attack. Pt is a smoker. No OCPs. No hx of Cancer.  HPI  Past Medical History:  Diagnosis Date  . Anxiety   . Panic attack   . Pregnant state, incidental     There are no active problems to display for this patient.   Past Surgical History:  Procedure Laterality Date  . ABSCESS DRAINAGE    . CESAREAN SECTION      OB History    Gravida Para Term Preterm AB Living   1             SAB TAB Ectopic Multiple Live Births                   Home Medications    Prior to Admission medications   Medication Sig Start Date End Date Taking? Authorizing Provider  acetaminophen (TYLENOL) 500 MG tablet Take 1,000 mg by mouth every 6 (six) hours as needed for mild pain, moderate pain or headache.    Historical Provider, MD  acetaminophen-codeine (TYLENOL #3) 300-30 MG tablet Take 1-2 tablets by mouth every 6 (six) hours as needed for moderate pain. 03/23/16   Trixie Dredge, PA-C  amoxicillin (AMOXIL) 500 MG capsule Take 1 capsule (500 mg total) by mouth 2 (two) times  daily. 10/12/15   Trixie Dredge, PA-C  LORazepam (ATIVAN) 1 MG tablet Take 0.5 tablets (0.5 mg total) by mouth 3 (three) times daily as needed for anxiety. Patient not taking: Reported on 06/03/2014 04/24/14   Raeford Razor, MD  metroNIDAZOLE (FLAGYL) 500 MG tablet Take 1 tablet (500 mg total) by mouth 2 (two) times daily. 06/17/15   Teressa Lower, NP    Family History No family history on file.  Social History Social History  Substance Use Topics  . Smoking status: Current Every Day Smoker    Packs/day: 0.50    Types: Cigarettes  . Smokeless tobacco: Never Used  . Alcohol use No     Allergies   Review of patient's allergies indicates no known allergies.   Review of Systems Review of Systems  All other systems reviewed and are negative.    Physical Exam Updated Vital Signs BP 122/77 (BP Location: Right Arm)   Pulse 68   Temp 97.6 F (36.4 C) (Oral)   Wt 59 kg   LMP 04/18/2016   SpO2 100%   BMI 23.03 kg/m   Physical Exam  Constitutional: She is oriented to person, place, and time. She appears well-developed and well-nourished. No distress.  HENT:  Head: Normocephalic and atraumatic.  Mouth/Throat: No oropharyngeal exudate.  Eyes: Conjunctivae and EOM are normal. Pupils are equal, round, and reactive to light. Right eye exhibits no discharge. Left eye exhibits no discharge. No scleral icterus.  Cardiovascular: Normal rate, regular rhythm, normal heart sounds and intact distal pulses.  Exam reveals no gallop and no friction rub.   No murmur heard. Pulmonary/Chest: Effort normal and breath sounds normal. No respiratory distress. She has no wheezes. She has no rales. She exhibits no tenderness.  Abdominal: Soft. She exhibits no distension. There is no tenderness. There is no guarding.  Musculoskeletal: Normal range of motion. She exhibits no edema.  Neurological: She is alert and oriented to person, place, and time.  Skin: Skin is warm and dry. No rash noted. She is not  diaphoretic. No erythema. No pallor.  Psychiatric:  Pt appears anxious  Nursing note and vitals reviewed.    ED Treatments / Results  Labs (all labs ordered are listed, but only abnormal results are displayed) Labs Reviewed  CBC  COMPREHENSIVE METABOLIC PANEL  I-STAT TROPOININ, ED    EKG  EKG Interpretation  Date/Time:  Tuesday April 25 2016 09:30:18 EDT Ventricular Rate:  73 PR Interval:    QRS Duration: 79 QT Interval:  366 QTC Calculation: 404 R Axis:   74 Text Interpretation:  Sinus rhythm Short PR interval since last tracing no significant change Confirmed by MILLER  MD, BRIAN ([id number]) on 04/25/2016 10:27:06 AM       Radiology  Procedures Procedures (including critical care time)  Medications Ordered in ED Medications  LORazepam (ATIVAN) injection 0.5 mg (not administered)     Initial Impression / Assessment and Plan / ED Course  I have reviewed the triage vital signs and the nursing notes.  Pertinent labs & imaging results that were available during my care of the patient were reviewed by me and considered in my medical decision making (see chart for details).  Clinical Course    25 y.o F presents to the ED today c/o chest pain onset this morning with episode of NBNB emesis. Pain is in left chest, does not radiate. On presentation to  ED, pt appears very anxious. VSS. Pt states she has experienced similar pain in the past that was related to her anxiety. EKG unremarkable. No sign of ischemia. CXR negative. Initial troponin wnl. All other lab work wnl. Pt was given ativan due to anxiety with significant symptomatic improvement, CP now resolved. Pt is PERC negative. Doubt PE. Low HEART score. Doubt ACS. Cp likely related to anxiety. Pt will be d/c to home with PCP follow up. Return precautions outlined in patient discharge instructions.    Final Clinical Impressions(s) / ED Diagnoses   Final diagnoses:  Atypical chest pain  Anxiety    New  Prescriptions New Prescriptions   No medications on file     Dub MikesSamantha Tripp Dowless, PA-C 04/27/16 0719    Eber HongBrian Miller, MD 04/28/16 1046

## 2016-04-25 NOTE — ED Notes (Signed)
Pt requests Drs. Note for work.

## 2016-04-25 NOTE — Discharge Instructions (Signed)
Follow up with primary care provider for re-evaluation. Take atarax as needed for anxiety.

## 2016-04-25 NOTE — ED Triage Notes (Addendum)
Pt c/o chest pain on waking this am. Pt states increasing chest pain over last week worse with deep breath. Pt states vomitted x 1 this am.

## 2016-04-25 NOTE — ED Notes (Signed)
Myself and Harley, EMT brought patient back to room; patient undressed, in gown, on monitor, continuous pulse oximetry and blood pressure cuff 

## 2016-04-25 NOTE — ED Notes (Signed)
Retuyrns from xray.

## 2016-04-25 NOTE — ED Notes (Signed)
Pt states she is much more relaxed and chest pain decreased states she understands instructions and will follow up as directed.  Pt home stable with steady gait to be picked up by friend.

## 2016-04-25 NOTE — ED Notes (Signed)
Pt ambulated to restroom. Pt tolerated well.  

## 2016-04-26 ENCOUNTER — Emergency Department (HOSPITAL_COMMUNITY): Payer: Self-pay

## 2016-04-26 ENCOUNTER — Encounter (HOSPITAL_COMMUNITY): Payer: Self-pay

## 2016-04-26 ENCOUNTER — Emergency Department (HOSPITAL_COMMUNITY)
Admission: EM | Admit: 2016-04-26 | Discharge: 2016-04-26 | Disposition: A | Discharge status: Home / Self Care | Payer: Self-pay | Attending: Emergency Medicine | Admitting: Emergency Medicine

## 2016-04-26 DIAGNOSIS — K6289 Other specified diseases of anus and rectum: Secondary | ICD-10-CM | POA: Insufficient documentation

## 2016-04-26 DIAGNOSIS — Z79899 Other long term (current) drug therapy: Secondary | ICD-10-CM | POA: Insufficient documentation

## 2016-04-26 DIAGNOSIS — F17210 Nicotine dependence, cigarettes, uncomplicated: Secondary | ICD-10-CM | POA: Insufficient documentation

## 2016-04-26 LAB — BASIC METABOLIC PANEL
Anion gap: 8 (ref 5–15)
BUN: 15 mg/dL (ref 6–20)
CALCIUM: 9.9 mg/dL (ref 8.9–10.3)
CHLORIDE: 103 mmol/L (ref 101–111)
CO2: 25 mmol/L (ref 22–32)
CREATININE: 0.71 mg/dL (ref 0.44–1.00)
Glucose, Bld: 93 mg/dL (ref 65–99)
Potassium: 3.9 mmol/L (ref 3.5–5.1)
SODIUM: 136 mmol/L (ref 135–145)

## 2016-04-26 LAB — CBC WITH DIFFERENTIAL/PLATELET
BASOS ABS: 0 10*3/uL (ref 0.0–0.1)
BASOS PCT: 0 %
EOS ABS: 0 10*3/uL (ref 0.0–0.7)
Eosinophils Relative: 0 %
HCT: 40.9 % (ref 36.0–46.0)
HEMOGLOBIN: 14.3 g/dL (ref 12.0–15.0)
Lymphocytes Relative: 20 %
Lymphs Abs: 1.1 10*3/uL (ref 0.7–4.0)
MCH: 32.7 pg (ref 26.0–34.0)
MCHC: 35 g/dL (ref 30.0–36.0)
MCV: 93.6 fL (ref 78.0–100.0)
Monocytes Absolute: 0.2 10*3/uL (ref 0.1–1.0)
Monocytes Relative: 4 %
NEUTROS PCT: 76 %
Neutro Abs: 4.1 10*3/uL (ref 1.7–7.7)
Platelets: 276 10*3/uL (ref 150–400)
RBC: 4.37 MIL/uL (ref 3.87–5.11)
RDW: 13.9 % (ref 11.5–15.5)
WBC: 5.4 10*3/uL (ref 4.0–10.5)

## 2016-04-26 LAB — I-STAT BETA HCG BLOOD, ED (MC, WL, AP ONLY)

## 2016-04-26 MED ORDER — DIATRIZOATE MEGLUMINE & SODIUM 66-10 % PO SOLN: 15.0000 mL | Freq: Once | ORAL | Status: DC

## 2016-04-26 MED ORDER — SODIUM CHLORIDE 0.9 % IV BOLUS (SEPSIS)
1000.0000 mL | Freq: Once | INTRAVENOUS | Status: AC
  Administered 2016-04-26: 1000 mL via INTRAVENOUS

## 2016-04-26 MED ORDER — DOXYCYCLINE HYCLATE 100 MG PO CAPS: 100.0000 mg | ORAL_CAPSULE | Freq: Two times a day (BID) | ORAL | 0 refills | Status: DC

## 2016-04-26 MED ORDER — IOPAMIDOL (ISOVUE-300) INJECTION 61%
75.0000 mL | Freq: Once | INTRAVENOUS | Status: AC | PRN
  Administered 2016-04-26: 75 mL via INTRAVENOUS

## 2016-04-26 MED ORDER — IOPAMIDOL (ISOVUE-300) INJECTION 61%
15.0000 mL | Freq: Once | INTRAVENOUS | Status: AC | PRN
  Administered 2016-04-26: 30 mL via ORAL

## 2016-04-26 NOTE — ED Notes (Signed)
Bed: WA21 Expected date:  Expected time:  Means of arrival:  Comments: 

## 2016-04-26 NOTE — ED Notes (Signed)
Small abscess to rectum, noticed today.Had multiple BM's yesterday.  Denies drainage but states she has had surgery to this area before because it was deep.  Skin is clean, dry and intact.

## 2016-04-26 NOTE — ED Notes (Signed)
Patient transported to CT 

## 2016-04-26 NOTE — ED Triage Notes (Signed)
Pt with abscess of rectum.  Noticed today.

## 2016-04-26 NOTE — ED Provider Notes (Signed)
[facility]-EMERGENCY DEPT Provider Note   CSN: [id number] Arrival date & time: 04/26/16  1019     History   Chief Complaint Chief Complaint  Patient presents with  . Abscess    HPI Sabrina Rasmussen is a 25 y.o. female who presents to the ED with CC of rectal pain. She has a past medical history of a perirectal abscess. Yesterday she noticed some pain and a bulge near her rectum. The patient states that it is exquisitely painful. She has been having diarrhea and has noticed that she is having significant pain with bowel movements. She states that the pain is internal and not external. She denies fevers or chills.  HPI  Past Medical History:  Diagnosis Date  . Anxiety   . Panic attack   . Pregnant state, incidental     There are no active problems to display for this patient.   Past Surgical History:  Procedure Laterality Date  . ABSCESS DRAINAGE    . CESAREAN SECTION      OB History    Gravida Para Term Preterm AB Living   1             SAB TAB Ectopic Multiple Live Births                   Home Medications    Prior to Admission medications   Medication Sig Start Date End Date Taking? Authorizing Provider  acetaminophen (TYLENOL) 500 MG tablet Take 1,000 mg by mouth every 6 (six) hours as needed for mild pain, moderate pain or headache.   Yes Historical Provider, MD  ibuprofen (ADVIL,MOTRIN) 200 MG tablet Take 200 mg by mouth every 6 (six) hours as needed for moderate pain.   Yes Historical Provider, MD  acetaminophen-codeine (TYLENOL #3) 300-30 MG tablet Take 1-2 tablets by mouth every 6 (six) hours as needed for moderate pain. Patient not taking: Reported on 04/26/2016 03/23/16   Trixie DredgeEmily West, PA-C  doxycycline (VIBRAMYCIN) 100 MG capsule Take 1 capsule (100 mg total) by mouth 2 (two) times daily. 04/26/16   Arthor CaptainAbigail Zunairah Devers, PA-C  hydrOXYzine (ATARAX/VISTARIL) 25 MG tablet Take 1 tablet (25 mg total) by mouth every 6 (six) hours. 04/25/16   Samantha Tripp Dowless, PA-C    LORazepam (ATIVAN) 1 MG tablet Take 0.5 tablets (0.5 mg total) by mouth 3 (three) times daily as needed for anxiety. Patient not taking: Reported on 04/25/2016 04/24/14   Raeford RazorStephen Kohut, MD    Family History History reviewed. No pertinent family history.  Social History Social History  Substance Use Topics  . Smoking status: Current Every Day Smoker    Packs/day: 0.50    Types: Cigarettes  . Smokeless tobacco: Never Used  . Alcohol use No     Allergies   Review of patient's allergies indicates no known allergies.   Review of Systems Review of Systems  Ten systems reviewed and are negative for acute change, except as noted in the HPI.   Physical Exam Updated Vital Signs BP 115/78 (BP Location: Right Arm)   Pulse 95   Temp 98.2 F (36.8 C) (Oral)   Resp 18   LMP 04/18/2016   SpO2 98%   Physical Exam  Constitutional: She is oriented to person, place, and time. She appears well-developed and well-nourished. No distress.  HENT:  Head: Normocephalic and atraumatic.  Eyes: Conjunctivae are normal. No scleral icterus.  Neck: Normal range of motion.  Cardiovascular: Normal rate, regular rhythm and normal heart sounds.  Exam  reveals no gallop and no friction rub.   No murmur heard. Pulmonary/Chest: Effort normal and breath sounds normal. No respiratory distress.  Abdominal: Soft. Bowel sounds are normal. She exhibits no distension and no mass. There is no tenderness. There is no guarding.  Genitourinary:     Neurological: She is alert and oriented to person, place, and time.  Skin: Skin is warm and dry. She is not diaphoretic.  Nursing note and vitals reviewed.    ED Treatments / Results  Labs (all labs ordered are listed, but only abnormal results are displayed) Labs Reviewed  BASIC METABOLIC PANEL  CBC WITH DIFFERENTIAL/PLATELET  I-STAT BETA HCG BLOOD, ED ([facility], [facility], [facility] ONLY)    EKG  EKG Interpretation None       Radiology Ct Pelvis W Contrast  Result  Date: 04/26/2016 CLINICAL DATA:  Evaluate perianal abscess. EXAM: CT PELVIS WITH CONTRAST TECHNIQUE: Multidetector CT imaging of the pelvis was performed using the standard protocol following the bolus administration of intravenous contrast. CONTRAST:  75mL ISOVUE-300 IOPAMIDOL (ISOVUE-300) INJECTION 61%, 30mL ISOVUE-300 IOPAMIDOL (ISOVUE-300) INJECTION 61% COMPARISON:  None. FINDINGS: Urinary Tract:  No abnormality visualized. Bowel:  Unremarkable visualized pelvic bowel loops. Vascular/Lymphatic: No pathologically enlarged lymph nodes. No significant vascular abnormality seen. Reproductive:  No mass or other significant abnormality Other: No discrete fluid collections identified to indicate site of suspected perianal abscess. Musculoskeletal: No suspicious bone lesions identified. IMPRESSION: 1. No focal fluid collections identified to suggest site of suspected perianal abscess. Electronically Signed   By: Signa Kell M.D.   On: 04/26/2016 14:41    Procedures Procedures (including critical care time)  Medications Ordered in ED Medications  sodium chloride 0.9 % bolus 1,000 mL (0 mLs Intravenous Stopped 04/26/16 1344)  iopamidol (ISOVUE-300) 61 % injection 15 mL (30 mLs Oral Contrast Given 04/26/16 1245)  iopamidol (ISOVUE-300) 61 % injection 75 mL (75 mLs Intravenous Contrast Given 04/26/16 1359)     Initial Impression / Assessment and Plan / ED Course  I have reviewed the triage vital signs and the nursing notes.  Pertinent labs & imaging results that were available during my care of the patient were reviewed by me and considered in my medical decision making (see chart for details).  Clinical Course  Comment By Time  Will obtain CT pelvis, given the patients hx of pain with defecation and perirectal abscess requiring surgical intervention. Arthor Captain, PA-C 10/25 1222    Patient ct scan is negative  No active fluid collection on ct. Will discharge with doxycycline . She has pain  meds from her visit yesterday.  Apply warm compresses, return in 48- 72 hours for reevaluation or sooner for worsening symptoms.   Final Clinical Impressions(s) / ED Diagnoses   Final diagnoses:  Anal or rectal pain    New Prescriptions Discharge Medication List as of 04/26/2016  3:06 PM    START taking these medications   Details  doxycycline (VIBRAMYCIN) 100 MG capsule Take 1 capsule (100 mg total) by mouth 2 (two) times daily., Starting Wed 04/26/2016, Print         [place], PA-C 04/27/16 1105    Lorre Nick, MD 04/27/16 [phone]

## 2016-04-26 NOTE — ED Notes (Signed)
Patient is resting comfortably. Warm blanket given  

## 2016-04-26 NOTE — Discharge Instructions (Signed)
Apply war, wet compresses to your bottom. Take the pain medicine you were prescribed at your last visit.  Your ct scan showed no acute abnormalities.  You may return if you are having worsening symptoms. Take the antibiotics as directed.  SEEK IMMEDIATE MEDICAL CARE IF:   You have problems moving or using your legs.  You have severe or increasing pain.  Your swelling in the affected area suddenly gets worse.  You have a large increase in bleeding or passing of pus.  You have chills or a fever.

## 2016-05-29 ENCOUNTER — Encounter (HOSPITAL_COMMUNITY): Payer: Self-pay | Admitting: Emergency Medicine

## 2016-05-29 ENCOUNTER — Emergency Department (HOSPITAL_COMMUNITY)
Admission: EM | Admit: 2016-05-29 | Discharge: 2016-05-29 | Disposition: A | Discharge status: Home / Self Care | Payer: Medicaid Other | Attending: Emergency Medicine | Admitting: Emergency Medicine

## 2016-05-29 DIAGNOSIS — J029 Acute pharyngitis, unspecified: Secondary | ICD-10-CM | POA: Insufficient documentation

## 2016-05-29 DIAGNOSIS — F17210 Nicotine dependence, cigarettes, uncomplicated: Secondary | ICD-10-CM | POA: Insufficient documentation

## 2016-05-29 LAB — RAPID STREP SCREEN (MED CTR MEBANE ONLY): Streptococcus, Group A Screen (Direct): NEGATIVE

## 2016-05-29 NOTE — ED Provider Notes (Signed)
[facility]-EMERGENCY DEPT Provider Note   CSN: [id number] Arrival date & time: 05/29/16  2138  By signing my name below, I, Rosario AdieWilliam Andrew Hiatt, attest that this documentation has been prepared under the direction and in the presence of Arvilla MeresAshley Meyer, PA-C.  Electronically Signed: Rosario AdieWilliam Andrew Hiatt, ED Scribe. 05/29/16. 11:51 PM.  History   Chief Complaint Chief Complaint  Patient presents with  . Sore Throat   The history is provided by the patient. No language interpreter was used.    HPI Comments: Sabrina Rasmussen is a 25 y.o. female who presents to the Emergency Department complaining of gradually improving, persistent sore throat onset yesterday. Pt reports that she took Nyquil prior to coming into the ED with moderate relief of her pain. Pt is able to tolerate their own secretions, but pain is exacerbated w/ swallowing. No sick contacts with similar symptoms. She is a current, everyday smoker. Denies congestion, rhinorrhea, fever, chills, cough, ear pain, ear discharge, neck pain, headache, or any other associated symptoms.   Past Medical History:  Diagnosis Date  . Anxiety   . Panic attack   . Pregnant state, incidental    There are no active problems to display for this patient.  Past Surgical History:  Procedure Laterality Date  . ABSCESS DRAINAGE    . CESAREAN SECTION     OB History    Gravida Para Term Preterm AB Living   1             SAB TAB Ectopic Multiple Live Births                 Home Medications    Prior to Admission medications   Medication Sig Start Date End Date Taking? Authorizing Provider  acetaminophen (TYLENOL) 500 MG tablet Take 1,000 mg by mouth every 6 (six) hours as needed for mild pain, moderate pain or headache.    Historical Provider, MD  acetaminophen-codeine (TYLENOL #3) 300-30 MG tablet Take 1-2 tablets by mouth every 6 (six) hours as needed for moderate pain. Patient not taking: Reported on 04/26/2016 03/23/16   Trixie DredgeEmily West, PA-C    doxycycline (VIBRAMYCIN) 100 MG capsule Take 1 capsule (100 mg total) by mouth 2 (two) times daily. 04/26/16   Arthor CaptainAbigail Harris, PA-C  hydrOXYzine (ATARAX/VISTARIL) 25 MG tablet Take 1 tablet (25 mg total) by mouth every 6 (six) hours. 04/25/16   Samantha Tripp Dowless, PA-C  ibuprofen (ADVIL,MOTRIN) 200 MG tablet Take 200 mg by mouth every 6 (six) hours as needed for moderate pain.    Historical Provider, MD  LORazepam (ATIVAN) 1 MG tablet Take 0.5 tablets (0.5 mg total) by mouth 3 (three) times daily as needed for anxiety. Patient not taking: Reported on 04/25/2016 04/24/14   Raeford RazorStephen Kohut, MD   Family History No family history on file.  Social History Social History  Substance Use Topics  . Smoking status: Current Every Day Smoker    Packs/day: 0.50    Types: Cigarettes  . Smokeless tobacco: Never Used  . Alcohol use No   Allergies   Patient has no known allergies.  Review of Systems Review of Systems  Constitutional: Negative for chills and fever.  HENT: Positive for sore throat. Negative for congestion, ear discharge, ear pain, rhinorrhea and trouble swallowing.   Eyes: Negative for discharge.  Respiratory: Negative for cough and shortness of breath.   Musculoskeletal: Negative for neck pain.  Neurological: Negative for headaches.   Physical Exam Updated Vital Signs BP 118/72 (BP Location: Right  Arm)   Pulse 86   Temp 97.9 F (36.6 C) (Oral)   Resp 22   Ht 5\' 3"  (1.6 m)   SpO2 99%   Physical Exam  Constitutional: She appears well-developed and well-nourished. No distress.  HENT:  Head: Normocephalic and atraumatic.  Right Ear: Tympanic membrane, external ear and ear canal normal. Tympanic membrane is not erythematous, not retracted and not bulging.  Left Ear: Tympanic membrane, external ear and ear canal normal. Tympanic membrane is not erythematous, not retracted and not bulging.  Nose: Mucosal edema and rhinorrhea present. Right sinus exhibits no maxillary sinus  tenderness and no frontal sinus tenderness. Left sinus exhibits no maxillary sinus tenderness and no frontal sinus tenderness.  Mouth/Throat: Uvula is midline and mucous membranes are normal. No trismus in the jaw. No uvula swelling. Posterior oropharyngeal erythema (mild) present. No oropharyngeal exudate, posterior oropharyngeal edema or tonsillar abscesses. No tonsillar exudate.  Mild nasal congestion and mucosal edema is noted. No trismus. Uvula midline. Mild posterior oropharynx erythema.   Eyes: Conjunctivae and EOM are normal. Pupils are equal, round, and reactive to light. Right eye exhibits no discharge. Left eye exhibits no discharge. No scleral icterus.  Neck: Normal range of motion and phonation normal. Neck supple. No neck rigidity. Normal range of motion present.  No nuchal rigidity.   Cardiovascular: Normal rate, regular rhythm, normal heart sounds and intact distal pulses.   No murmur heard. Pulmonary/Chest: Effort normal and breath sounds normal. No stridor. No respiratory distress. She has no wheezes. She has no rales.  Lymphadenopathy:    She has no cervical adenopathy.  Neurological: She is alert.  Skin: Skin is warm and dry. She is not diaphoretic.  Psychiatric: She has a normal mood and affect. Her behavior is normal.   ED Treatments / Results  DIAGNOSTIC STUDIES: Oxygen Saturation is 100% on RA, normal by my interpretation.   COORDINATION OF CARE: 11:48 PM-Discussed next steps with pt. Pt verbalized understanding and is agreeable with the plan.   Labs (all labs ordered are listed, but only abnormal results are displayed) Labs Reviewed  RAPID STREP SCREEN (NOT [facility])  CULTURE, GROUP A STREP [facility])   Procedures Procedures   Medications Ordered in ED Medications - No data to display  Initial Impression / Assessment and Plan / ED Course  I have reviewed the triage vital signs and the nursing notes.  Pertinent labs & imaging results that were available  during my care of the patient were reviewed by me and considered in my medical decision making (see chart for details).  Clinical Course    Patient presents to ED with complaint of improving sore throat x 1 day. Patient is afebrile and non-toxic appearing in NAD. VSS. Mild posterior oropharynx erythema without tonsillary hypertophy or exudate. No trismus. Uvula midline. No cervical lymphadenopathy. No nuchal rigidity. Negative strep. ?viral pharyngitis. Low suspicion for PTA or infection spread to soft tissues. No abx indicated. D/C w/ symptomatic tx for pain  Pt does not appear dehydrated, but did discuss importance of water rehydration.  Specific return precautions discussed.  Recommended PCP follow up. Pt voiced understanding and is agreeable.   Final Clinical Impressions(s) / ED Diagnoses   Final diagnoses:  Sore throat   New Prescriptions Discharge Medication List as of 05/29/2016 11:51 PM     I personally performed the services described in this documentation, which was scribed in my presence. The recorded information has been reviewed and is accurate.     Morrie SheldonAshley  Jamse Arn, PA-C 05/30/16 1610    Gilda Crease, MD 05/30/16 [phone]

## 2016-05-29 NOTE — Discharge Instructions (Signed)
Read the information below.  Your rapid strep was negative. It is being sent for culture, if positive you will be notified and placed on antibiotics. You can take tylenol/motrin for pain relief. You can gargle with warm salt water. Warm liquids such as soup and tea can be very soothing.  Use the prescribed medication as directed.  Please discuss all new medications with your pharmacist.   Be sure to follow up with your primary provider if symptoms persist.  You may return to the Emergency Department at any time for worsening condition or any new symptoms that concern you. Return to the ED if you develop fever, trouble swallowing, trouble breathing, unable to open your mouth, or any other new/concerning symptoms.

## 2016-05-29 NOTE — ED Triage Notes (Signed)
Patient reports sore throat onset yesterday , denies cough / no fever or chills .

## 2016-06-01 LAB — CULTURE, GROUP A STREP (THRC)

## 2016-07-15 ENCOUNTER — Encounter (HOSPITAL_COMMUNITY): Payer: Self-pay | Admitting: Emergency Medicine

## 2016-07-15 ENCOUNTER — Emergency Department (HOSPITAL_COMMUNITY)
Admission: EM | Admit: 2016-07-15 | Discharge: 2016-07-15 | Disposition: A | Discharge status: Home / Self Care | Payer: Medicaid Other | Attending: Emergency Medicine | Admitting: Emergency Medicine

## 2016-07-15 DIAGNOSIS — B9689 Other specified bacterial agents as the cause of diseases classified elsewhere: Secondary | ICD-10-CM | POA: Insufficient documentation

## 2016-07-15 DIAGNOSIS — N898 Other specified noninflammatory disorders of vagina: Secondary | ICD-10-CM

## 2016-07-15 DIAGNOSIS — N760 Acute vaginitis: Secondary | ICD-10-CM | POA: Insufficient documentation

## 2016-07-15 DIAGNOSIS — F17210 Nicotine dependence, cigarettes, uncomplicated: Secondary | ICD-10-CM | POA: Insufficient documentation

## 2016-07-15 LAB — URINALYSIS, ROUTINE W REFLEX MICROSCOPIC
BACTERIA UA: NONE SEEN
Bilirubin Urine: NEGATIVE
Glucose, UA: NEGATIVE mg/dL
Hgb urine dipstick: NEGATIVE
KETONES UR: NEGATIVE mg/dL
Nitrite: NEGATIVE
PH: 6 (ref 5.0–8.0)
PROTEIN: NEGATIVE mg/dL
Specific Gravity, Urine: 1.014 (ref 1.005–1.030)

## 2016-07-15 LAB — WET PREP, GENITAL
Sperm: NONE SEEN
TRICH WET PREP: NONE SEEN
YEAST WET PREP: NONE SEEN

## 2016-07-15 LAB — PREGNANCY, URINE: Preg Test, Ur: NEGATIVE

## 2016-07-15 MED ORDER — STERILE WATER FOR INJECTION IJ SOLN
INTRAMUSCULAR | Status: AC
Start: 2016-07-15 — End: 2016-07-15
  Administered 2016-07-15: 10 mL
  Filled 2016-07-15: qty 10

## 2016-07-15 MED ORDER — AZITHROMYCIN 250 MG PO TABS
1000.0000 mg | ORAL_TABLET | Freq: Once | ORAL | Status: AC
  Administered 2016-07-15: 1000 mg via ORAL
  Filled 2016-07-15: qty 4

## 2016-07-15 MED ORDER — CEFTRIAXONE SODIUM 250 MG IJ SOLR
250.0000 mg | Freq: Once | INTRAMUSCULAR | Status: AC
  Administered 2016-07-15: 250 mg via INTRAMUSCULAR
  Filled 2016-07-15: qty 250

## 2016-07-15 MED ORDER — METRONIDAZOLE 500 MG PO TABS: 500.0000 mg | ORAL_TABLET | Freq: Two times a day (BID) | ORAL | 0 refills | Status: DC

## 2016-07-15 NOTE — Discharge Instructions (Signed)
Take antibiotics as prescribed for BV  We will call you regarding the rest of your results if they do come back positive  Return without fail for worsening symptoms, including severe abdominal pain, fever, or any other symptoms concerning to you.

## 2016-07-15 NOTE — ED Provider Notes (Signed)
[facility]-EMERGENCY DEPT Provider Note   CSN: [id number] Arrival date & time: 07/15/16  1009     History   Chief Complaint Chief Complaint  Patient presents with  . Vaginal Discharge    HPI Sabrina Rasmussen is a 26 y.o. female.  HPI  26 year old female who presents with abnormal vaginal discharge. 1 week of symptoms with some vaginal irritation and itching. Tried to use  OTC yeast treatment, but persistent discharge. Discharge now is yellow and green. No abdominal pain, nausea, vomiting, fever, dysuria, frequency of urine. LMP just finished a few days ago. Endorses unprotected sexual intercourse recently with a partner who is not her boyfriend and concerned about exposure to STD.  Past Medical History:  Diagnosis Date  . Anxiety   . Panic attack   . Pregnant state, incidental     There are no active problems to display for this patient.   Past Surgical History:  Procedure Laterality Date  . ABSCESS DRAINAGE    . CESAREAN SECTION      OB History    Gravida Para Term Preterm AB Living   1             SAB TAB Ectopic Multiple Live Births                   Home Medications    Prior to Admission medications   Medication Sig Start Date End Date Taking? Authorizing Provider  Homeopathic Products (AZO YEAST PLUS PO) Take 1 tablet by mouth 3 (three) times daily.   Yes Historical Provider, MD  acetaminophen-codeine (TYLENOL #3) 300-30 MG tablet Take 1-2 tablets by mouth every 6 (six) hours as needed for moderate pain. Patient not taking: Reported on 07/15/2016 03/23/16   Trixie Dredge, PA-C  doxycycline (VIBRAMYCIN) 100 MG capsule Take 1 capsule (100 mg total) by mouth 2 (two) times daily. Patient not taking: Reported on 07/15/2016 04/26/16   Arthor Captain, PA-C  hydrOXYzine (ATARAX/VISTARIL) 25 MG tablet Take 1 tablet (25 mg total) by mouth every 6 (six) hours. Patient not taking: Reported on 07/15/2016 04/25/16   Samantha Tripp Dowless, PA-C  LORazepam (ATIVAN) 1 MG tablet Take  0.5 tablets (0.5 mg total) by mouth 3 (three) times daily as needed for anxiety. Patient not taking: Reported on 07/15/2016 04/24/14   Raeford Razor, MD  metroNIDAZOLE (FLAGYL) 500 MG tablet Take 1 tablet (500 mg total) by mouth 2 (two) times daily. 07/15/16   Lavera Guise, MD    Family History No family history on file.  Social History Social History  Substance Use Topics  . Smoking status: Current Every Day Smoker    Packs/day: 0.50    Types: Cigarettes  . Smokeless tobacco: Never Used  . Alcohol use No     Allergies   Patient has no known allergies.   Review of Systems Review of Systems 10/14 systems reviewed and are negative other than those stated in the HPI"  Physical Exam Updated Vital Signs BP 113/95   Pulse 92   Temp 98.4 F (36.9 C) (Oral)   Resp 18   LMP 07/13/2016   SpO2 100%   Physical Exam Physical Exam  Nursing note and vitals reviewed. Constitutional: Well developed, well nourished, non-toxic, and in no acute distress Head: Normocephalic and atraumatic.  Mouth/Throat: Oropharynx is clear and moist.  Neck: Normal range of motion. Neck supple.  Cardiovascular: Normal rate and regular rhythm.   Pulmonary/Chest: Effort normal and breath sounds normal.  Abdominal: Soft.  There is no tenderness. There is no rebound and no guarding.  Musculoskeletal: Normal range of motion.  Neurological: Alert, no facial droop, fluent speech, moves all extremities symmetrically Skin: Skin is warm and dry.  Psychiatric: Cooperative Pelvic: Normal external genitalia. Normal internal genitalia. Copious white discharge. No blood within the vagina. No cervical motion tenderness. No adnexal masses or tenderness.  ED Treatments / Results  Labs (all labs ordered are listed, but only abnormal results are displayed) Labs Reviewed  WET PREP, GENITAL - Abnormal; Notable for the following:       Result Value   Clue Cells Wet Prep HPF POC PRESENT (*)    WBC, Wet Prep HPF POC MANY  (*)    All other components within normal limits  URINALYSIS, ROUTINE W REFLEX MICROSCOPIC - Abnormal; Notable for the following:    Leukocytes, UA SMALL (*)    Squamous Epithelial / LPF 0-5 (*)    All other components within normal limits  URINE CULTURE  PREGNANCY, URINE  GC/CHLAMYDIA PROBE AMP ([facility] NOT [facility]    EKG  EKG Interpretation None       Radiology No results found.  Procedures Procedures (including critical care time)  Medications Ordered in ED Medications  cefTRIAXone (ROCEPHIN) injection 250 mg (250 mg Intramuscular Given 07/15/16 1221)  azithromycin (ZITHROMAX) tablet 1,000 mg (1,000 mg Oral Given 07/15/16 1221)  sterile water (preservative free) injection (10 mLs  Given 07/15/16 1221)     Initial Impression / Assessment and Plan / ED Course  I have reviewed the triage vital signs and the nursing notes.  Pertinent labs & imaging results that were available during my care of the patient were reviewed by me and considered in my medical decision making (see chart for details).  Clinical Course     Presenting with vaginal discharge and concern for STD exposure. She is well-appearing in no acute distress. Vital signs stable. She is soft and benign abdomen. Pelvic exam with copious white vaginal discharge, but no concerning features for PID or TOA. Did request empiric treatment for GC/chlamydia, and given ceftriaxone and azithromycin. What prep also positive for BV and will treat with course of Flagyl. She is decline HIV and syphilis testing, despite encouragement. Strict return and follow-up instructions reviewed. She expressed understanding of all discharge instructions and felt comfortable with the plan of care.   Final Clinical Impressions(s) / ED Diagnoses   Final diagnoses:  BV (bacterial vaginosis)  Vaginal discharge    New Prescriptions New Prescriptions   METRONIDAZOLE (FLAGYL) 500 MG TABLET    Take 1 tablet (500 mg total) by mouth 2 (two)  times daily.     Lavera Guiseana Duo Suraya Vidrine, MD 07/15/16 [phone]

## 2016-07-15 NOTE — ED Notes (Signed)
Patient is resting waiting  on provider

## 2016-07-15 NOTE — ED Triage Notes (Signed)
NOTE: Pt chief complaint at registration is noted as chest pain. Pt is NOT here for chest pain.   Pt reports one week of thick clumpy vaginal discharge that is white and frothy. Pt crying. Pt states she has been googling her symptoms. Pt discharge also has an odor. Pt reports unprotected sex when she cheated on her boyfriend and that is why she is this upset.

## 2016-07-16 LAB — URINE CULTURE

## 2016-07-17 LAB — GC/CHLAMYDIA PROBE AMP (~~LOC~~) NOT AT ARMC
CHLAMYDIA, DNA PROBE: NEGATIVE
NEISSERIA GONORRHEA: NEGATIVE

## 2016-07-21 ENCOUNTER — Emergency Department (HOSPITAL_COMMUNITY): Payer: Self-pay

## 2016-07-21 ENCOUNTER — Emergency Department (HOSPITAL_COMMUNITY)
Admission: EM | Admit: 2016-07-21 | Discharge: 2016-07-21 | Disposition: A | Discharge status: Home / Self Care | Payer: Self-pay | Attending: Physician Assistant | Admitting: Physician Assistant

## 2016-07-21 ENCOUNTER — Encounter (HOSPITAL_COMMUNITY): Payer: Self-pay | Admitting: Certified Nurse Midwife

## 2016-07-21 DIAGNOSIS — J111 Influenza due to unidentified influenza virus with other respiratory manifestations: Secondary | ICD-10-CM | POA: Insufficient documentation

## 2016-07-21 DIAGNOSIS — F17210 Nicotine dependence, cigarettes, uncomplicated: Secondary | ICD-10-CM | POA: Insufficient documentation

## 2016-07-21 DIAGNOSIS — R69 Illness, unspecified: Secondary | ICD-10-CM

## 2016-07-21 DIAGNOSIS — Z79899 Other long term (current) drug therapy: Secondary | ICD-10-CM | POA: Insufficient documentation

## 2016-07-21 LAB — COMPREHENSIVE METABOLIC PANEL
ALT: 13 U/L — ABNORMAL LOW (ref 14–54)
AST: 18 U/L (ref 15–41)
Albumin: 3.5 g/dL (ref 3.5–5.0)
Alkaline Phosphatase: 62 U/L (ref 38–126)
Anion gap: 6 (ref 5–15)
BUN: 17 mg/dL (ref 6–20)
CO2: 25 mmol/L (ref 22–32)
Calcium: 9.7 mg/dL (ref 8.9–10.3)
Chloride: 110 mmol/L (ref 101–111)
Creatinine, Ser: 0.68 mg/dL (ref 0.44–1.00)
GFR calc Af Amer: >60 mL/min (ref >60)
GFR calc non Af Amer: >60 mL/min (ref >60)
Glucose, Bld: 95 mg/dL (ref 65–99)
Potassium: 4.4 mmol/L (ref 3.5–5.1)
Sodium: 141 mmol/L (ref 135–145)
Total Bilirubin: 0.5 mg/dL (ref 0.3–1.2)
Total Protein: 5.9 g/dL — ABNORMAL LOW (ref 6.5–8.1)

## 2016-07-21 LAB — CBC WITH DIFFERENTIAL/PLATELET
Basophils Absolute: 0 10*3/uL (ref 0.0–0.1)
Basophils Relative: 0 %
Eosinophils Absolute: 0.1 10*3/uL (ref 0.0–0.7)
Eosinophils Relative: 2 %
HCT: 36.9 % (ref 36.0–46.0)
Hemoglobin: 12.3 g/dL (ref 12.0–15.0)
Lymphocytes Relative: 21 %
Lymphs Abs: 1 10*3/uL (ref 0.7–4.0)
MCH: 31.7 pg (ref 26.0–34.0)
MCHC: 33.3 g/dL (ref 30.0–36.0)
MCV: 95.1 fL (ref 78.0–100.0)
Monocytes Absolute: 0.3 10*3/uL (ref 0.1–1.0)
Monocytes Relative: 7 %
Neutro Abs: 3.5 10*3/uL (ref 1.7–7.7)
Neutrophils Relative %: 70 %
Platelets: 228 10*3/uL (ref 150–400)
RBC: 3.88 MIL/uL (ref 3.87–5.11)
RDW: 14.1 % (ref 11.5–15.5)
WBC: 5 10*3/uL (ref 4.0–10.5)

## 2016-07-21 LAB — RAPID STREP SCREEN (MED CTR MEBANE ONLY): Streptococcus, Group A Screen (Direct): NEGATIVE

## 2016-07-21 MED ORDER — SODIUM CHLORIDE 0.9 % IV BOLUS (SEPSIS)
1000.0000 mL | Freq: Once | INTRAVENOUS | Status: AC
Start: 2016-07-21 — End: 2016-07-21
  Administered 2016-07-21: 1000 mL via INTRAVENOUS

## 2016-07-21 MED ORDER — KETOROLAC TROMETHAMINE 30 MG/ML IJ SOLN
30.0000 mg | Freq: Once | INTRAMUSCULAR | Status: AC
  Administered 2016-07-21: 30 mg via INTRAVENOUS
  Filled 2016-07-21: qty 1

## 2016-07-21 MED ORDER — PROMETHAZINE-DM 6.25-15 MG/5ML PO SYRP: 5.0000 mL | ORAL_SOLUTION | Freq: Four times a day (QID) | ORAL | 0 refills | Status: DC | PRN

## 2016-07-21 MED ORDER — IBUPROFEN 800 MG PO TABS: 800.0000 mg | ORAL_TABLET | Freq: Three times a day (TID) | ORAL | 0 refills | Status: DC | PRN

## 2016-07-21 MED ORDER — SODIUM CHLORIDE 0.9 % IV BOLUS (SEPSIS)
1000.0000 mL | Freq: Once | INTRAVENOUS | Status: AC
  Administered 2016-07-21: 1000 mL via INTRAVENOUS

## 2016-07-21 MED ORDER — ONDANSETRON HCL 4 MG/2ML IJ SOLN
4.0000 mg | Freq: Once | INTRAMUSCULAR | Status: AC
  Administered 2016-07-21: 4 mg via INTRAVENOUS
  Filled 2016-07-21: qty 2

## 2016-07-21 MED ORDER — GUAIFENESIN ER 1200 MG PO TB12: 1.0000 | ORAL_TABLET | Freq: Two times a day (BID) | ORAL | 0 refills | Status: DC

## 2016-07-21 MED ORDER — LORAZEPAM 2 MG/ML IJ SOLN: 1.0000 mg | Freq: Once | INTRAMUSCULAR | Status: DC

## 2016-07-21 NOTE — Discharge Instructions (Signed)
Return here as needed.  Follow-up with primary care doctor, rest as much as possible and increase your fluid intake

## 2016-07-21 NOTE — ED Notes (Signed)
Patient has returned from using the bathroom; patient placed back on continuous pulse oximetry and blood pressure cuff

## 2016-07-21 NOTE — ED Triage Notes (Signed)
Pt states she has had flu like symptoms since Monday. Pt had N/V through the night and a fever/chills. Pt states she has a sore throat.

## 2016-07-21 NOTE — ED Notes (Signed)
Patient up ambulatory to the bathroom without any difficulty or distress 

## 2016-07-23 LAB — CULTURE, GROUP A STREP (THRC)

## 2016-07-23 NOTE — ED Provider Notes (Signed)
[facility]-EMERGENCY DEPT Provider Note   CSN: [id number] Arrival date & time: 07/21/16  0707     History   Chief Complaint Chief Complaint  Patient presents with  . Nausea  . Sore Throat  . Fever    HPI Sabrina Rasmussen is a 26 y.o. female.  HPI Patient presents to the emergency department with nausea, sore throat, cough and body aches over the last 5 days.  The patient states that her symptoms have been persistent and has not responded to any over-the-counter medicationsThe patient denies chest pain, shortness of breath, headache,blurred vision, neck pain, weakness, numbness, dizziness, anorexia, edema, abdominal pain, diarrhea, rash, back pain, dysuria, hematemesis, bloody stool, near syncope, or syncope. Past Medical History:  Diagnosis Date  . Anxiety   . Panic attack   . Pregnant state, incidental     There are no active problems to display for this patient.   Past Surgical History:  Procedure Laterality Date  . ABSCESS DRAINAGE    . CESAREAN SECTION      OB History    Gravida Para Term Preterm AB Living   1             SAB TAB Ectopic Multiple Live Births                   Home Medications    Prior to Admission medications   Medication Sig Start Date End Date Taking? Authorizing Provider  metroNIDAZOLE (FLAGYL) 500 MG tablet Take 1 tablet (500 mg total) by mouth 2 (two) times daily. 07/15/16  Yes Lavera Guiseana Duo Liu, MD  acetaminophen-codeine (TYLENOL #3) 300-30 MG tablet Take 1-2 tablets by mouth every 6 (six) hours as needed for moderate pain. Patient not taking: Reported on 07/15/2016 03/23/16   Trixie DredgeEmily West, PA-C  doxycycline (VIBRAMYCIN) 100 MG capsule Take 1 capsule (100 mg total) by mouth 2 (two) times daily. Patient not taking: Reported on 07/15/2016 04/26/16   Arthor CaptainAbigail Harris, PA-C  Guaifenesin 1200 MG TB12 Take 1 tablet (1,200 mg total) by mouth 2 (two) times daily. 07/21/16   Charlestine Nighthristopher Joseandres Mazer, PA-C  Homeopathic Products (AZO YEAST PLUS PO) Take 1 tablet by  mouth 3 (three) times daily.    Historical Provider, MD  hydrOXYzine (ATARAX/VISTARIL) 25 MG tablet Take 1 tablet (25 mg total) by mouth every 6 (six) hours. Patient not taking: Reported on 07/15/2016 04/25/16   Samantha Tripp Dowless, PA-C  ibuprofen (ADVIL,MOTRIN) 800 MG tablet Take 1 tablet (800 mg total) by mouth every 8 (eight) hours as needed. 07/21/16   Sorina Derrig, PA-C  LORazepam (ATIVAN) 1 MG tablet Take 0.5 tablets (0.5 mg total) by mouth 3 (three) times daily as needed for anxiety. Patient not taking: Reported on 07/15/2016 04/24/14   Raeford RazorStephen Kohut, MD  promethazine-dextromethorphan (PROMETHAZINE-DM) 6.25-15 MG/5ML syrup Take 5 mLs by mouth 4 (four) times daily as needed for cough. 07/21/16   Charlestine Nighthristopher Meldrick Buttery, PA-C    Family History No family history on file.  Social History Social History  Substance Use Topics  . Smoking status: Current Every Day Smoker    Packs/day: 0.50    Types: Cigarettes  . Smokeless tobacco: Never Used  . Alcohol use No     Allergies   Patient has no known allergies.   Review of Systems Review of Systems  All other systems negative except as documented in the HPI. All pertinent positives and negatives as reviewed in the HPI. Physical Exam Updated Vital Signs BP 105/66   Pulse 67  Temp 98.2 F (36.8 C) (Oral)   Resp 16   Ht 5\' 3"  (1.6 m)   Wt 59 kg   LMP 07/13/2016 (Approximate)   SpO2 99%   BMI 23.03 kg/m   Physical Exam  Constitutional: She is oriented to person, place, and time. She appears well-developed and well-nourished. No distress.  HENT:  Head: Normocephalic and atraumatic.  Mouth/Throat: Oropharynx is clear and moist.  Eyes: Pupils are equal, round, and reactive to light.  Neck: Normal range of motion. Neck supple.  Cardiovascular: Normal rate, regular rhythm and normal heart sounds.  Exam reveals no gallop and no friction rub.   No murmur heard. Pulmonary/Chest: Effort normal and breath sounds normal. No  respiratory distress. She has no wheezes.  Abdominal: Soft. Bowel sounds are normal. She exhibits no distension. There is no tenderness.  Neurological: She is alert and oriented to person, place, and time. She exhibits normal muscle tone. Coordination normal.  Skin: Skin is warm and dry. No rash noted. No erythema.  Psychiatric: She has a normal mood and affect. Her behavior is normal.  Nursing note and vitals reviewed.    ED Treatments / Results  Labs (all labs ordered are listed, but only abnormal results are displayed) Labs Reviewed  COMPREHENSIVE METABOLIC PANEL - Abnormal; Notable for the following:       Result Value   Total Protein 5.9 (*)    ALT 13 (*)    All other components within normal limits  RAPID STREP SCREEN (NOT AT [facility])  CULTURE, GROUP A STREP ([facility])  CBC WITH DIFFERENTIAL/PLATELET    EKG  EKG Interpretation None       Radiology No results found.  Procedures Procedures (including critical care time)  Medications Ordered in ED Medications  sodium chloride 0.9 % bolus 1,000 mL (0 mLs Intravenous Stopped 07/21/16 1053)  ondansetron (ZOFRAN) injection 4 mg (4 mg Intravenous Given 07/21/16 0919)  ketorolac (TORADOL) 30 MG/ML injection 30 mg (30 mg Intravenous Given 07/21/16 1107)  sodium chloride 0.9 % bolus 1,000 mL (0 mLs Intravenous Stopped 07/21/16 1258)     Initial Impression / Assessment and Plan / ED Course  I have reviewed the triage vital signs and the nursing notes.  Pertinent labs & imaging results that were available during my care of the patient were reviewed by me and considered in my medical decision making (see chart for details).     She will be treated for influenza-like illness.  Told to return here as needed.  Patient agrees the plan.  All questions were answered.  I advised her to rest as much possible and increase her fluid intake  Final Clinical Impressions(s) / ED Diagnoses   Final diagnoses:  Influenza-like illness    New  Prescriptions Discharge Medication List as of 07/21/2016 12:50 PM    START taking these medications   Details  Guaifenesin 1200 MG TB12 Take 1 tablet (1,200 mg total) by mouth 2 (two) times daily., Starting Fri 07/21/2016, Print    ibuprofen (ADVIL,MOTRIN) 800 MG tablet Take 1 tablet (800 mg total) by mouth every 8 (eight) hours as needed., Starting Fri 07/21/2016, Print    promethazine-dextromethorphan (PROMETHAZINE-DM) 6.25-15 MG/5ML syrup Take 5 mLs by mouth 4 (four) times daily as needed for cough., Starting Fri 07/21/2016, Print         Charlestine Night, PA-C 07/23/16 1628    Courteney Randall An, MD 07/24/16 1610

## 2016-08-07 ENCOUNTER — Emergency Department (HOSPITAL_COMMUNITY): Payer: Self-pay

## 2016-08-07 ENCOUNTER — Encounter (HOSPITAL_COMMUNITY): Payer: Self-pay | Admitting: *Deleted

## 2016-08-07 ENCOUNTER — Emergency Department (HOSPITAL_COMMUNITY)
Admission: EM | Admit: 2016-08-07 | Discharge: 2016-08-07 | Disposition: A | Discharge status: Home / Self Care | Payer: Self-pay | Attending: Emergency Medicine | Admitting: Emergency Medicine

## 2016-08-07 DIAGNOSIS — R112 Nausea with vomiting, unspecified: Secondary | ICD-10-CM | POA: Insufficient documentation

## 2016-08-07 DIAGNOSIS — R197 Diarrhea, unspecified: Secondary | ICD-10-CM | POA: Insufficient documentation

## 2016-08-07 DIAGNOSIS — Z79899 Other long term (current) drug therapy: Secondary | ICD-10-CM | POA: Insufficient documentation

## 2016-08-07 DIAGNOSIS — F17210 Nicotine dependence, cigarettes, uncomplicated: Secondary | ICD-10-CM | POA: Insufficient documentation

## 2016-08-07 DIAGNOSIS — R1012 Left upper quadrant pain: Secondary | ICD-10-CM | POA: Insufficient documentation

## 2016-08-07 DIAGNOSIS — R109 Unspecified abdominal pain: Secondary | ICD-10-CM

## 2016-08-07 LAB — CBC
HCT: 40.2 % (ref 36.0–46.0)
Hemoglobin: 13.6 g/dL (ref 12.0–15.0)
MCH: 31.8 pg (ref 26.0–34.0)
MCHC: 33.8 g/dL (ref 30.0–36.0)
MCV: 93.9 fL (ref 78.0–100.0)
PLATELETS: 249 10*3/uL (ref 150–400)
RBC: 4.28 MIL/uL (ref 3.87–5.11)
RDW: 13.6 % (ref 11.5–15.5)
WBC: 4.7 10*3/uL (ref 4.0–10.5)

## 2016-08-07 LAB — COMPREHENSIVE METABOLIC PANEL
ALBUMIN: 4.2 g/dL (ref 3.5–5.0)
ALK PHOS: 59 U/L (ref 38–126)
ALT: 17 U/L (ref 14–54)
AST: 22 U/L (ref 15–41)
Anion gap: 8 (ref 5–15)
BUN: 15 mg/dL (ref 6–20)
CALCIUM: 9.6 mg/dL (ref 8.9–10.3)
CHLORIDE: 106 mmol/L (ref 101–111)
CO2: 24 mmol/L (ref 22–32)
CREATININE: 0.68 mg/dL (ref 0.44–1.00)
GFR calc non Af Amer: >60 mL/min (ref >60)
GLUCOSE: 96 mg/dL (ref 65–99)
Potassium: 4 mmol/L (ref 3.5–5.1)
Sodium: 138 mmol/L (ref 135–145)
Total Bilirubin: 0.9 mg/dL (ref 0.3–1.2)
Total Protein: 7 g/dL (ref 6.5–8.1)

## 2016-08-07 LAB — URINALYSIS, ROUTINE W REFLEX MICROSCOPIC
BILIRUBIN URINE: NEGATIVE
GLUCOSE, UA: NEGATIVE mg/dL
Ketones, ur: NEGATIVE mg/dL
Nitrite: NEGATIVE
Protein, ur: NEGATIVE mg/dL
SPECIFIC GRAVITY, URINE: 1.025 (ref 1.005–1.030)
pH: 6 (ref 5.0–8.0)

## 2016-08-07 LAB — I-STAT BETA HCG BLOOD, ED (MC, WL, AP ONLY)

## 2016-08-07 LAB — URINALYSIS, MICROSCOPIC (REFLEX)

## 2016-08-07 LAB — LIPASE, BLOOD: LIPASE: 22 U/L (ref 11–51)

## 2016-08-07 MED ORDER — IOPAMIDOL (ISOVUE-300) INJECTION 61%
INTRAVENOUS | Status: AC
  Administered 2016-08-07: 13:00:00
  Filled 2016-08-07: qty 100

## 2016-08-07 MED ORDER — MORPHINE SULFATE (PF) 4 MG/ML IV SOLN
4.0000 mg | Freq: Once | INTRAVENOUS | Status: AC
Start: 2016-08-07 — End: 2016-08-07
  Administered 2016-08-07: 4 mg via INTRAVENOUS
  Filled 2016-08-07: qty 1

## 2016-08-07 MED ORDER — DICYCLOMINE HCL 20 MG PO TABS
20.0000 mg | ORAL_TABLET | Freq: Once | ORAL | Status: AC
  Administered 2016-08-07: 20 mg via ORAL
  Filled 2016-08-07: qty 1

## 2016-08-07 MED ORDER — DICYCLOMINE HCL 20 MG PO TABS: 20.0000 mg | ORAL_TABLET | Freq: Two times a day (BID) | ORAL | 0 refills | Status: DC

## 2016-08-07 MED ORDER — ONDANSETRON HCL 4 MG/2ML IJ SOLN
4.0000 mg | Freq: Once | INTRAMUSCULAR | Status: AC
  Administered 2016-08-07: 4 mg via INTRAVENOUS
  Filled 2016-08-07: qty 2

## 2016-08-07 MED ORDER — ONDANSETRON HCL 4 MG PO TABS: 4.0000 mg | ORAL_TABLET | Freq: Three times a day (TID) | ORAL | 0 refills | Status: DC | PRN

## 2016-08-07 NOTE — ED Notes (Signed)
PT did not have to use RR at this time  

## 2016-08-07 NOTE — Discharge Instructions (Signed)
°  Continue frequent small sips (10-20 ml) of clear liquids every 5-10 minutes. Gatorade or powerade are good options. Avoid milk, orange juice, and grape juice for now. . Once you have not had further vomiting with the small sips for 4 hours, you may begin to drink larger volumes of fluids at a time and try a bland diet which may include saltine crackers, applesauce, breads, pastas, bananas, bland chicken. If you continues to vomit despite medication, return to the ED for repeat evaluation. Otherwise, follow up with your doctor in 2-3 days for a re-check.    Get help right away if:  You have chest pain.  You feel extremely weak or you faint.  You see blood in your vomit.  Your vomit looks like coffee grounds.  You have bloody or black stools or stools that look like tar.  You have a severe headache, a stiff neck, or both.  You have a rash.  You have severe pain, cramping, or bloating in your abdomen.  You have trouble breathing or you are breathing very quickly.  Your heart is beating very quickly.  Your skin feels cold and clammy.  You feel confused.  You have pain when you urinate.  You have signs of dehydration, such as:  Dark urine, very little urine, or no urine.  Cracked lips.  Dry mouth.  Sunken eyes.  Sleepiness.  Weakness.

## 2016-08-07 NOTE — ED Triage Notes (Addendum)
Pt thinks she's suffering from food poisoning from super bowl food.  Woke up this am with diarrhea,  Vomiting and abdominal pain.  Just recovering from Flu 2 weeks ago.

## 2016-08-07 NOTE — ED Notes (Signed)
Pt went to CT

## 2016-08-07 NOTE — ED Notes (Signed)
PT went to RR earlier was unable to get sample. Sample cup is at bedside.

## 2016-08-07 NOTE — ED Provider Notes (Signed)
[facility]-EMERGENCY DEPT Provider Note   CSN: [id number] Arrival date & time: 08/07/16  4098     History   Chief Complaint Chief Complaint  Patient presents with  . Abdominal Pain    HPI Sabrina Rasmussen is a 26 y.o. female who presents to the ED with a cc of abdominal pain. The patient states that she awoke in the night with N/V/D.  She feels that it might be secondary to something she ate at a superbowl party last night . She denies urinary sxs, hx of IBD/ IBS, recent foreign travel.   HPI  Past Medical History:  Diagnosis Date  . Anxiety   . Panic attack   . Pregnant state, incidental     There are no active problems to display for this patient.   Past Surgical History:  Procedure Laterality Date  . ABSCESS DRAINAGE    . CESAREAN SECTION      OB History    Gravida Para Term Preterm AB Living   1             SAB TAB Ectopic Multiple Live Births                   Home Medications    Prior to Admission medications   Medication Sig Start Date End Date Taking? Authorizing Provider  acetaminophen-codeine (TYLENOL #3) 300-30 MG tablet Take 1-2 tablets by mouth every 6 (six) hours as needed for moderate pain. Patient not taking: Reported on 07/15/2016 03/23/16   Trixie Dredge, PA-C  hydrOXYzine (ATARAX/VISTARIL) 25 MG tablet Take 1 tablet (25 mg total) by mouth every 6 (six) hours. Patient not taking: Reported on 07/15/2016 04/25/16   Samantha Tripp Dowless, PA-C  ibuprofen (ADVIL,MOTRIN) 800 MG tablet Take 1 tablet (800 mg total) by mouth every 8 (eight) hours as needed. Patient not taking: Reported on 08/07/2016 07/21/16   Charlestine Night, PA-C  LORazepam (ATIVAN) 1 MG tablet Take 0.5 tablets (0.5 mg total) by mouth 3 (three) times daily as needed for anxiety. Patient not taking: Reported on 07/15/2016 04/24/14   Raeford Razor, MD    Family History No family history on file.  Social History Social History  Substance Use Topics  . Smoking status: Current Every Day  Smoker    Packs/day: 0.50    Types: Cigarettes  . Smokeless tobacco: Never Used  . Alcohol use No     Allergies   Patient has no known allergies.   Review of Systems Review of Systems  Ten systems reviewed and are negative for acute change, except as noted in the HPI.   Physical Exam Updated Vital Signs BP 119/85   Pulse 72   Temp 98.7 F (37.1 C) (Oral)   Resp 16   Ht 5\' 3"  (1.6 m)   Wt 61.2 kg   LMP 07/09/2016   SpO2 98%   BMI 23.91 kg/m   Physical Exam  Constitutional: She is oriented to person, place, and time. She appears well-developed and well-nourished. No distress.  HENT:  Head: Normocephalic and atraumatic.  Eyes: Conjunctivae are normal. No scleral icterus.  Neck: Normal range of motion.  Cardiovascular: Normal rate, regular rhythm and normal heart sounds.  Exam reveals no gallop and no friction rub.   No murmur heard. Pulmonary/Chest: Effort normal and breath sounds normal. No respiratory distress.  Abdominal: Soft. Bowel sounds are normal. She exhibits no distension and no mass. There is tenderness in the left upper quadrant. There is no guarding.  Neurological: She is alert and oriented to person, place, and time.  Skin: Skin is warm and dry. She is not diaphoretic.  Nursing note and vitals reviewed.    ED Treatments / Results  Labs (all labs ordered are listed, but only abnormal results are displayed) Labs Reviewed  URINALYSIS, ROUTINE W REFLEX MICROSCOPIC - Abnormal; Notable for the following:       Result Value   APPearance HAZY (*)    Hgb urine dipstick TRACE (*)    Leukocytes, UA TRACE (*)    All other components within normal limits  URINALYSIS, MICROSCOPIC (REFLEX) - Abnormal; Notable for the following:    Bacteria, UA MANY (*)    Squamous Epithelial / LPF 6-30 (*)    All other components within normal limits  GASTROINTESTINAL PANEL BY PCR, STOOL (REPLACES STOOL CULTURE)  LIPASE, BLOOD  COMPREHENSIVE METABOLIC PANEL  CBC    I-STAT BETA HCG BLOOD, ED ([facility], [facility], [facility] ONLY)    EKG  EKG Interpretation None       Radiology No results found.  Procedures Procedures (including critical care time)  Medications Ordered in ED Medications  ondansetron (ZOFRAN) injection 4 mg (4 mg Intravenous Given 08/07/16 0949)  dicyclomine (BENTYL) tablet 20 mg (20 mg Oral Given 08/07/16 1012)     Initial Impression / Assessment and Plan / ED Course  I have reviewed the triage vital signs and the nursing notes.  Pertinent labs & imaging results that were available during my care of the patient were reviewed by me and considered in my medical decision making (see chart for details).      Patient with symptoms consistent with viral gastroenteritis.  Vitals are stable, no fever.  No signs of dehydration, tolerating PO fluids > 6 oz.  Lungs are clear.  No focal abdominal pain, no concern for appendicitis, cholecystitis, pancreatitis, ruptured viscus, UTI, kidney stone, or any other abdominal etiology.  Supportive therapy indicated with return if symptoms worsen.  Patient counseled.   Final Clinical Impressions(s) / ED Diagnoses   Final diagnoses:  Abdominal pain, unspecified abdominal location  Nausea vomiting and diarrhea    New Prescriptions New Prescriptions   No medications on file     Arthor Captainbigail Burhanuddin Kohlmann, PA-C 08/07/16 1323    Rolland PorterMark James, MD 08/19/16 2255

## 2016-08-09 ENCOUNTER — Encounter (HOSPITAL_COMMUNITY): Payer: Self-pay | Admitting: *Deleted

## 2016-08-09 ENCOUNTER — Emergency Department (HOSPITAL_COMMUNITY)
Admission: EM | Admit: 2016-08-09 | Discharge: 2016-08-09 | Disposition: A | Discharge status: Home / Self Care | Payer: Medicaid Other | Attending: Emergency Medicine | Admitting: Emergency Medicine

## 2016-08-09 DIAGNOSIS — F17210 Nicotine dependence, cigarettes, uncomplicated: Secondary | ICD-10-CM | POA: Insufficient documentation

## 2016-08-09 DIAGNOSIS — N898 Other specified noninflammatory disorders of vagina: Secondary | ICD-10-CM

## 2016-08-09 DIAGNOSIS — N760 Acute vaginitis: Secondary | ICD-10-CM | POA: Insufficient documentation

## 2016-08-09 DIAGNOSIS — B9689 Other specified bacterial agents as the cause of diseases classified elsewhere: Secondary | ICD-10-CM

## 2016-08-09 LAB — WET PREP, GENITAL
SPERM: NONE SEEN
Trich, Wet Prep: NONE SEEN
Yeast Wet Prep HPF POC: NONE SEEN

## 2016-08-09 LAB — URINALYSIS, ROUTINE W REFLEX MICROSCOPIC
Bilirubin Urine: NEGATIVE
GLUCOSE, UA: NEGATIVE mg/dL
HGB URINE DIPSTICK: NEGATIVE
KETONES UR: NEGATIVE mg/dL
Leukocytes, UA: NEGATIVE
Nitrite: NEGATIVE
PROTEIN: NEGATIVE mg/dL
Specific Gravity, Urine: 1.012 (ref 1.005–1.030)
pH: 7 (ref 5.0–8.0)

## 2016-08-09 LAB — POC URINE PREG, ED: Preg Test, Ur: NEGATIVE

## 2016-08-09 MED ORDER — METRONIDAZOLE 500 MG PO TABS: 500.0000 mg | ORAL_TABLET | Freq: Two times a day (BID) | ORAL | 0 refills | Status: DC

## 2016-08-09 MED ORDER — FLUCONAZOLE 100 MG PO TABS
150.0000 mg | ORAL_TABLET | Freq: Once | ORAL | Status: AC
  Administered 2016-08-09: 150 mg via ORAL
  Filled 2016-08-09: qty 2

## 2016-08-09 NOTE — ED Triage Notes (Signed)
Pt reports vaginal discharge for several days. Pt was diagnosed with bacterial vaginosis and given medications but symptoms have continued. Pt also states that he needs a not for her work saying that she can return after being out with a stomach virus.

## 2016-08-09 NOTE — ED Provider Notes (Signed)
[facility]PT Provider Note   CSN: [id number] Arrival date & time: 08/09/16  4098   By signing my name below, I, Soijett Blue, attest that this documentation has been prepared under the direction and in the presence of Shaune Pollack, MD. Electronically Signed: Soijett Blue, ED Scribe. 08/09/16. 12:32 PM.  History   Chief Complaint Chief Complaint  Patient presents with  . Vaginal Discharge    HPI Sabrina Rasmussen is a 26 y.o. female who presents to the Emergency Department complaining of vaginal discharge onset 2 days ago. Pt reports associated nausea and generalized weakness. Pt notes that she was diagnosed with BV 2 weeks ago and she completed the course of Rx flagyl with no relief of her symptoms. Pt reports that she uses scented soaps and she is sexually active. Pt notes that she uses condoms as her contraceptives. Pt also states that she was seen in the ED 2 days ago for abdominal pain and began to have episodes of emesis while at work and was informed to come into the ED for evaluation by her employer. Pt states that she has sick contacts of her boss who has similar symptoms. She denies vomiting, vaginal bleeding, dyspareunia, fever, CP, cough, and any other symptoms. Patient's last menstrual period was 07/13/2016.   The history is provided by the patient. No language interpreter was used.    Past Medical History:  Diagnosis Date  . Anxiety   . Panic attack   . Pregnant state, incidental     There are no active problems to display for this patient.   Past Surgical History:  Procedure Laterality Date  . ABSCESS DRAINAGE    . CESAREAN SECTION      OB History    Gravida Para Term Preterm AB Living   1             SAB TAB Ectopic Multiple Live Births                   Home Medications    Prior to Admission medications   Medication Sig Start Date End Date Taking? Authorizing Provider  acetaminophen-codeine (TYLENOL #3) 300-30 MG tablet Take 1-2 tablets by  mouth every 6 (six) hours as needed for moderate pain. Patient not taking: Reported on 07/15/2016 03/23/16   Trixie Dredge, PA-C  dicyclomine (BENTYL) 20 MG tablet Take 1 tablet (20 mg total) by mouth 2 (two) times daily. 08/07/16   Arthor Captain, PA-C  hydrOXYzine (ATARAX/VISTARIL) 25 MG tablet Take 1 tablet (25 mg total) by mouth every 6 (six) hours. Patient not taking: Reported on 07/15/2016 04/25/16   Samantha Tripp Dowless, PA-C  ibuprofen (ADVIL,MOTRIN) 800 MG tablet Take 1 tablet (800 mg total) by mouth every 8 (eight) hours as needed. Patient not taking: Reported on 08/07/2016 07/21/16   Charlestine Night, PA-C  LORazepam (ATIVAN) 1 MG tablet Take 0.5 tablets (0.5 mg total) by mouth 3 (three) times daily as needed for anxiety. Patient not taking: Reported on 07/15/2016 04/24/14   Raeford Razor, MD  metroNIDAZOLE (FLAGYL) 500 MG tablet Take 1 tablet (500 mg total) by mouth 2 (two) times daily. 08/09/16   Shaune Pollack, MD  ondansetron (ZOFRAN) 4 MG tablet Take 1 tablet (4 mg total) by mouth every 8 (eight) hours as needed for nausea or vomiting. 08/07/16   Arthor Captain, PA-C    Family History No family history on file.  Social History Social History  Substance Use Topics  . Smoking status: Current Every Day Smoker  Packs/day: 0.50    Types: Cigarettes  . Smokeless tobacco: Never Used  . Alcohol use No     Allergies   Patient has no known allergies.   Review of Systems Review of Systems  Gastrointestinal: Positive for nausea.  Genitourinary: Positive for vaginal discharge.  Neurological: Positive for weakness (generalized).  All other systems reviewed and are negative.    Physical Exam Updated Vital Signs BP 101/57 (BP Location: Right Arm)   Pulse 76   Temp 98 F (36.7 C) (Oral)   Resp 16   LMP 07/13/2016 (Approximate)   SpO2 100%   Physical Exam  Constitutional: She is oriented to person, place, and time. She appears well-developed and well-nourished. No distress.    HENT:  Head: Normocephalic and atraumatic.  Eyes: Conjunctivae are normal.  Neck: Neck supple.  Cardiovascular: Normal rate, regular rhythm and normal heart sounds.  Exam reveals no friction rub.   No murmur heard. Pulmonary/Chest: Effort normal and breath sounds normal. No respiratory distress. She has no wheezes. She has no rales.  Abdominal: Soft. Bowel sounds are normal. She exhibits no distension. There is no tenderness. There is no rebound and no guarding.  Genitourinary: Cervix exhibits no motion tenderness. Right adnexum displays no tenderness. Left adnexum displays no tenderness. Vaginal discharge found.  Genitourinary Comments: Chaperone present for exam. Moderate amount of thick yellow discharge. No CMT. No adnexal tenderness.   Musculoskeletal: She exhibits no edema.  Neurological: She is alert and oriented to person, place, and time. She exhibits normal muscle tone.  Skin: Skin is warm. Capillary refill takes less than 2 seconds. No rash noted.  Psychiatric: She has a normal mood and affect.  Nursing note and vitals reviewed.    ED Treatments / Results  DIAGNOSTIC STUDIES: Oxygen Saturation is 100% on RA, nl by my interpretation.    COORDINATION OF CARE: 12:31 PM Discussed treatment plan with pt at bedside which includes UA, wet prep, pelvic exam, and pt agreed to plan.   Labs (all labs ordered are listed, but only abnormal results are displayed) Labs Reviewed  WET PREP, GENITAL - Abnormal; Notable for the following:       Result Value   Clue Cells Wet Prep HPF POC PRESENT (*)    WBC, Wet Prep HPF POC FEW (*)    All other components within normal limits  URINALYSIS, ROUTINE W REFLEX MICROSCOPIC - Abnormal; Notable for the following:    Color, Urine STRAW (*)    All other components within normal limits  POC URINE PREG, ED  GC/CHLAMYDIA PROBE AMP ([place]) NOT AT [facility]    Radiology No results found.  Procedures Procedures (including critical care  time)  Medications Ordered in ED Medications  fluconazole (DIFLUCAN) tablet 150 mg (not administered)     Initial Impression / Assessment and Plan / ED Course  I have reviewed the triage vital signs and the nursing notes.  Pertinent labs & imaging results that were available during my care of the patient were reviewed by me and considered in my medical decision making (see chart for details).    26 yo F with PMHx as above including recent GI bug, diagnosis of BV here with persistent vaginal discharge. Diagnosed 2-3 weeks ago, improved with flagyl now back. No dyspareunia or fevers. Exam as above, c/w likely yeast vaginitis as well as BV. Wet prep + clue cells, + WBCs. Pt has no fever, no CMT, no adnexal TTP and declines STD treatment, and I do not  suspect PID or TOA. Pt uses douche as well as strong smelling soap - advised against this and will refer for outpt f/u. Otherwise, re: her GI bug she seems to be improving. Tolerating PO. Abdomen soft, NT, and ND. Will treat, d/c home with work note.  Final Clinical Impressions(s) / ED Diagnoses   Final diagnoses:  BV (bacterial vaginosis)  Vaginal discharge    New Prescriptions New Prescriptions   METRONIDAZOLE (FLAGYL) 500 MG TABLET    Take 1 tablet (500 mg total) by mouth 2 (two) times daily.    I personally performed the services described in this documentation, which was scribed in my presence. The recorded information has been reviewed and is accurate.     Shaune Pollackameron Jasma Seevers, MD 08/09/16 1340

## 2016-08-09 NOTE — Discharge Planning (Signed)
Pt up for discharge. EDCM reviewed chart for possible CM needs.  No needs identified or communicated.  

## 2016-08-10 LAB — GC/CHLAMYDIA PROBE AMP (~~LOC~~) NOT AT ARMC
Chlamydia: NEGATIVE
Neisseria Gonorrhea: NEGATIVE

## 2016-09-13 ENCOUNTER — Emergency Department (HOSPITAL_COMMUNITY)
Admission: EM | Admit: 2016-09-13 | Discharge: 2016-09-13 | Disposition: A | Discharge status: Home / Self Care | Payer: Medicaid Other | Attending: Emergency Medicine | Admitting: Emergency Medicine

## 2016-09-13 ENCOUNTER — Encounter (HOSPITAL_COMMUNITY): Payer: Self-pay

## 2016-09-13 DIAGNOSIS — F17210 Nicotine dependence, cigarettes, uncomplicated: Secondary | ICD-10-CM | POA: Insufficient documentation

## 2016-09-13 DIAGNOSIS — J069 Acute upper respiratory infection, unspecified: Secondary | ICD-10-CM | POA: Insufficient documentation

## 2016-09-13 LAB — RAPID STREP SCREEN (MED CTR MEBANE ONLY): Streptococcus, Group A Screen (Direct): NEGATIVE

## 2016-09-13 NOTE — Discharge Instructions (Signed)
Flonase and mucinex for nasal congestion as needed. Tylenol or ibuprofen as needed for sore throat / fevers. Rest, drink plenty of fluids to be sure you are staying hydrated. Please follow up with your primary doctor for discussion of your diagnoses and further evaluation after today's visit if symptoms persist longer than 7 days; Return to the ER for high fevers, difficulty breathing or other concerning symptoms

## 2016-09-13 NOTE — ED Provider Notes (Signed)
[facility] Provider Note   CSN: [id number] Arrival date & time: 09/13/16  1300  By signing my name below, I, Marnette Burgessyan Andrew Long, attest that this documentation has been prepared under the direction and in the presence of Jaime P. Ward, PA-C. Electronically Signed: Marnette Burgessyan Andrew Long, Scribe. 09/13/2016. 2:31 PM.   History   Chief Complaint Chief Complaint  Patient presents with  . Sore Throat   The history is provided by the patient and medical records. No language interpreter was used.    HPI Comments:  Sabrina Rasmussen is a 26 y.o. female with a PMHx of anxiety who presents to the Emergency Department complaining of gradually worsening sore throat onset this morning. She quantifies the sore throat pain as 7/10. She reports she began feeling bad a couple of days ago with nasal congestion and headache with sore throat starting this morning. Associated with non-bloody loose stools that began today as well. She tried Tylenol and Dayquil at home with no relief of her symptoms. No sick contact noted but does state she is around kids frequently. Pt denies abdominal pain, vomiting, nausea, fever, neck pain or any other complaints at this time. Pt is a current every day smoker.   Past Medical History:  Diagnosis Date  . Anxiety   . Panic attack   . Pregnant state, incidental     There are no active problems to display for this patient.   Past Surgical History:  Procedure Laterality Date  . ABSCESS DRAINAGE    . CESAREAN SECTION      OB History    Gravida Para Term Preterm AB Living   1             SAB TAB Ectopic Multiple Live Births                   Home Medications    Prior to Admission medications   Medication Sig Start Date End Date Taking? Authorizing Provider  acetaminophen-codeine (TYLENOL #3) 300-30 MG tablet Take 1-2 tablets by mouth every 6 (six) hours as needed for moderate pain. Patient not taking: Reported on 07/15/2016 03/23/16   Trixie DredgeEmily West, PA-C    dicyclomine (BENTYL) 20 MG tablet Take 1 tablet (20 mg total) by mouth 2 (two) times daily. 08/07/16   Arthor CaptainAbigail Harris, PA-C  hydrOXYzine (ATARAX/VISTARIL) 25 MG tablet Take 1 tablet (25 mg total) by mouth every 6 (six) hours. Patient not taking: Reported on 07/15/2016 04/25/16   Samantha Tripp Dowless, PA-C  ibuprofen (ADVIL,MOTRIN) 800 MG tablet Take 1 tablet (800 mg total) by mouth every 8 (eight) hours as needed. Patient not taking: Reported on 08/07/2016 07/21/16   Charlestine Nighthristopher Lawyer, PA-C  LORazepam (ATIVAN) 1 MG tablet Take 0.5 tablets (0.5 mg total) by mouth 3 (three) times daily as needed for anxiety. Patient not taking: Reported on 07/15/2016 04/24/14   Raeford RazorStephen Kohut, MD  metroNIDAZOLE (FLAGYL) 500 MG tablet Take 1 tablet (500 mg total) by mouth 2 (two) times daily. 08/09/16   Shaune Pollackameron Isaacs, MD  ondansetron (ZOFRAN) 4 MG tablet Take 1 tablet (4 mg total) by mouth every 8 (eight) hours as needed for nausea or vomiting. 08/07/16   Arthor CaptainAbigail Harris, PA-C    Family History History reviewed. No pertinent family history.  Social History Social History  Substance Use Topics  . Smoking status: Current Every Day Smoker    Packs/day: 0.50    Types: Cigarettes  . Smokeless tobacco: Never Used  . Alcohol use No  Allergies   Patient has no known allergies.   Review of Systems Review of Systems  Constitutional: Negative for fever.  HENT: Positive for congestion and sore throat.   Respiratory: Negative for shortness of breath.   Cardiovascular: Negative for chest pain.  Gastrointestinal: Positive for diarrhea. Negative for blood in stool, nausea and vomiting.  Neurological: Positive for headaches.     Physical Exam Updated Vital Signs BP 114/76 (BP Location: Right Arm)   Pulse 73   Temp 99.1 F (37.3 C) (Oral)   Resp 14   LMP 08/31/2016 (Within Days)   SpO2 100%   Physical Exam  Constitutional: She appears well-developed and well-nourished. No distress.  HENT:  Head:  Normocephalic and atraumatic.  OP with scant erythema, no exudates or tonsillar hypertrophy. + PND.   Neck: Neck supple.  Cardiovascular: Normal rate, regular rhythm and normal heart sounds.   No murmur heard. Pulmonary/Chest: Effort normal and breath sounds normal. No respiratory distress. She has no wheezes.  Abdominal: Soft. Bowel sounds are normal. She exhibits no distension. There is no tenderness.  Musculoskeletal: Normal range of motion.  Neurological: She is alert.  Skin: Skin is warm and dry.  Nursing note and vitals reviewed.    ED Treatments / Results  DIAGNOSTIC STUDIES:  Oxygen Saturation is 100% on RA, normal by my interpretation.    COORDINATION OF CARE:  2:31 PM Discussed treatment plan with pt at bedside including Ibuprofen, fluid intake, and symptom relief and pt agreed to plan.  Labs (all labs ordered are listed, but only abnormal results are displayed) Labs Reviewed  RAPID STREP SCREEN (NOT AT [facility])  CULTURE, GROUP A STREP [facility])    EKG  EKG Interpretation None       Radiology No results found.  Procedures Procedures (including critical care time)  Medications Ordered in ED Medications - No data to display   Initial Impression / Assessment and Plan / ED Course  I have reviewed the triage vital signs and the nursing notes.  Pertinent labs & imaging results that were available during my care of the patient were reviewed by me and considered in my medical decision making (see chart for details).    Sabrina Blight is afebrile, non-toxic appearing with a clear lung exam. Mild rhinorrhea and OP with erythema, no exudates. Rapid strep negative. Likely viral URI. Patient is agreeable to symptomatic treatment with close follow up with PCP as needed but spoke at length about emergent, changing, or worsening of symptoms that should prompt return to ER. Patient voices understanding and is agreeable to plan.   Blood pressure 114/76, pulse 73,  temperature 99.1 F (37.3 C), temperature source Oral, resp. rate 14, SpO2 100 %, unknown if currently breastfeeding.   Final Clinical Impressions(s) / ED Diagnoses   Final diagnoses:  Viral URI    New Prescriptions Discharge Medication List as of 09/13/2016  2:38 PM     I personally performed the services described in this documentation, which was scribed in my presence. The recorded information has been reviewed and is accurate.      [facility] Ward, PA-C 09/13/16 1546    Canary Brim Tegeler, MD 09/13/16 [phone]

## 2016-09-13 NOTE — ED Triage Notes (Signed)
Pt states sore throat X3 hours. States she took tylenol at home with no relief.

## 2016-09-15 LAB — CULTURE, GROUP A STREP (THRC)

## 2016-10-14 ENCOUNTER — Emergency Department (HOSPITAL_COMMUNITY)
Admission: EM | Admit: 2016-10-14 | Discharge: 2016-10-14 | Disposition: A | Discharge status: Home / Self Care | Payer: Medicaid Other | Attending: Emergency Medicine | Admitting: Emergency Medicine

## 2016-10-14 ENCOUNTER — Encounter (HOSPITAL_COMMUNITY): Payer: Self-pay | Admitting: Emergency Medicine

## 2016-10-14 DIAGNOSIS — N72 Inflammatory disease of cervix uteri: Secondary | ICD-10-CM | POA: Insufficient documentation

## 2016-10-14 DIAGNOSIS — Z79899 Other long term (current) drug therapy: Secondary | ICD-10-CM | POA: Insufficient documentation

## 2016-10-14 DIAGNOSIS — F17210 Nicotine dependence, cigarettes, uncomplicated: Secondary | ICD-10-CM | POA: Insufficient documentation

## 2016-10-14 LAB — WET PREP, GENITAL
Clue Cells Wet Prep HPF POC: NONE SEEN
SPERM: NONE SEEN
TRICH WET PREP: NONE SEEN
YEAST WET PREP: NONE SEEN

## 2016-10-14 LAB — POC URINE PREG, ED: Preg Test, Ur: NEGATIVE

## 2016-10-14 MED ORDER — LIDOCAINE HCL (PF) 1 % IJ SOLN
INTRAMUSCULAR | Status: AC
  Administered 2016-10-14: 5 mL
  Filled 2016-10-14: qty 5

## 2016-10-14 MED ORDER — ONDANSETRON 4 MG PO TBDP
4.0000 mg | ORAL_TABLET | Freq: Once | ORAL | Status: AC
  Administered 2016-10-14: 4 mg via ORAL
  Filled 2016-10-14: qty 1

## 2016-10-14 MED ORDER — AZITHROMYCIN 250 MG PO TABS
1000.0000 mg | ORAL_TABLET | Freq: Once | ORAL | Status: AC
  Administered 2016-10-14: 1000 mg via ORAL
  Filled 2016-10-14: qty 4

## 2016-10-14 MED ORDER — CEFTRIAXONE SODIUM 250 MG IJ SOLR
250.0000 mg | Freq: Once | INTRAMUSCULAR | Status: AC
  Administered 2016-10-14: 250 mg via INTRAMUSCULAR
  Filled 2016-10-14: qty 250

## 2016-10-14 NOTE — ED Triage Notes (Signed)
Pt. Stated, I have "vaginosis" some discharge for a week.

## 2016-10-14 NOTE — Discharge Instructions (Signed)
HAVE PARTNER TESTED AND TREATED FOR STD.

## 2016-10-14 NOTE — ED Provider Notes (Signed)
[facility] Provider Note   CSN: [id number] Arrival date & time: 10/14/16  1053     History   Chief Complaint Chief Complaint  Patient presents with  . Vaginal Discharge    HPI Sabrina Rasmussen is a 26 y.o. female.  26yo F w/ PMH including anxiety who p/w vaginal discharge. Patient reports 1 week of vaginal discharge that is similar to previous episode of BV. She denies any associated lower abdominal pain or urinary symptoms. Last menstrual period was 2 weeks ago. She is sexually active with one partner and does not use condoms. She denies any fevers, vomiting, or other complaints. She did use douching after the discharge began.   The history is provided by the patient.  Vaginal Discharge      Past Medical History:  Diagnosis Date  . Anxiety   . Panic attack   . Pregnant state, incidental     There are no active problems to display for this patient.   Past Surgical History:  Procedure Laterality Date  . ABSCESS DRAINAGE    . CESAREAN SECTION      OB History    Gravida Para Term Preterm AB Living   1             SAB TAB Ectopic Multiple Live Births                   Home Medications    Prior to Admission medications   Medication Sig Start Date End Date Taking? Authorizing Provider  acetaminophen-codeine (TYLENOL #3) 300-30 MG tablet Take 1-2 tablets by mouth every 6 (six) hours as needed for moderate pain. Patient not taking: Reported on 07/15/2016 03/23/16   Trixie Dredge, PA-C  dicyclomine (BENTYL) 20 MG tablet Take 1 tablet (20 mg total) by mouth 2 (two) times daily. 08/07/16   Arthor Captain, PA-C  hydrOXYzine (ATARAX/VISTARIL) 25 MG tablet Take 1 tablet (25 mg total) by mouth every 6 (six) hours. Patient not taking: Reported on 07/15/2016 04/25/16   Samantha Tripp Dowless, PA-C  ibuprofen (ADVIL,MOTRIN) 800 MG tablet Take 1 tablet (800 mg total) by mouth every 8 (eight) hours as needed. Patient not taking: Reported on 08/07/2016 07/21/16   Charlestine Night, PA-C  LORazepam (ATIVAN) 1 MG tablet Take 0.5 tablets (0.5 mg total) by mouth 3 (three) times daily as needed for anxiety. Patient not taking: Reported on 07/15/2016 04/24/14   Raeford Razor, MD  metroNIDAZOLE (FLAGYL) 500 MG tablet Take 1 tablet (500 mg total) by mouth 2 (two) times daily. 08/09/16   Shaune Pollack, MD  ondansetron (ZOFRAN) 4 MG tablet Take 1 tablet (4 mg total) by mouth every 8 (eight) hours as needed for nausea or vomiting. 08/07/16   Arthor Captain, PA-C    Family History No family history on file.  Social History Social History  Substance Use Topics  . Smoking status: Current Every Day Smoker    Packs/day: 0.50    Types: Cigarettes  . Smokeless tobacco: Never Used  . Alcohol use No     Allergies   Patient has no known allergies.   Review of Systems Review of Systems  Genitourinary: Positive for vaginal discharge.  All other systems reviewed and are negative.    Physical Exam Updated Vital Signs BP 112/78   Pulse 80   Temp 98.2 F (36.8 C)   Resp 19   Ht  (1.6 m)   LMP 09/23/2016   SpO2 100%   Physical Exam  Constitutional: She  is oriented to person, place, and time. She appears well-developed and well-nourished. No distress.  HENT:  Head: Normocephalic and atraumatic.  Moist mucous membranes  Eyes: Conjunctivae are normal. Pupils are equal, round, and reactive to light.  Neck: Neck supple.  Cardiovascular: Normal rate, regular rhythm and normal heart sounds.   No murmur heard. Pulmonary/Chest: Effort normal and breath sounds normal.  Abdominal: Soft. Bowel sounds are normal. She exhibits no distension. There is no tenderness.  Genitourinary:  Genitourinary Comments: Moderate amount white vaginal discharge in vault, friable cervix, no cervical motion or adnexal tenderness  Musculoskeletal: She exhibits no edema.  Neurological: She is alert and oriented to person, place, and time.  Fluent speech  Skin: Skin is warm and dry.    Psychiatric: She has a normal mood and affect. Judgment normal.  Nursing note and vitals reviewed. Chaperone was present during exam.    ED Treatments / Results  Labs (all labs ordered are listed, but only abnormal results are displayed) Labs Reviewed  WET PREP, GENITAL - Abnormal; Notable for the following:       Result Value   WBC, Wet Prep HPF POC MODERATE (*)    All other components within normal limits  POC URINE PREG, ED  GC/CHLAMYDIA PROBE AMP ([place]) NOT AT [facility]    EKG  EKG Interpretation None       Radiology No results found.  Procedures Procedures (including critical care time)  Medications Ordered in ED Medications  ondansetron (ZOFRAN-ODT) disintegrating tablet 4 mg (4 mg Oral Given 10/14/16 1337)  cefTRIAXone (ROCEPHIN) injection 250 mg (250 mg Intramuscular Given 10/14/16 1338)  azithromycin (ZITHROMAX) tablet 1,000 mg (1,000 mg Oral Given 10/14/16 1337)  lidocaine (PF) (XYLOCAINE) 1 % injection (5 mLs  Given 10/14/16 1338)     Initial Impression / Assessment and Plan / ED Course  I have reviewed the triage vital signs and the nursing notes.  Pertinent labs that were available during my care of the patient were reviewed by me and considered in my medical decision making (see chart for details).    Pt w/ 1 week of vaginal discharge. She had white discharge and friable cervix on exam, no abdominal tenderness or cervical motion tenderness to suggest PID. Well appearing w/ normal VS.   UPT negative. Wet prep negative for BV or Trichomonas, GC/assessment. Exam is consistent with cervicitis and I gave the patient ceftriaxone and azithromycin. Counseled on having partners tested and treated prior to any intercourse. Extensively reviewed return precautions. Patient voiced understanding and was discharged in satisfactory condition. Final Clinical Impressions(s) / ED Diagnoses   Final diagnoses:  Cervicitis    New Prescriptions Discharge Medication List  as of 10/14/2016  1:56 PM       Laurence Spates, MD 10/14/16 1624

## 2016-10-16 LAB — GC/CHLAMYDIA PROBE AMP (~~LOC~~) NOT AT ARMC
Chlamydia: NEGATIVE
Neisseria Gonorrhea: NEGATIVE

## 2016-11-01 ENCOUNTER — Emergency Department (HOSPITAL_COMMUNITY)
Admission: EM | Admit: 2016-11-01 | Discharge: 2016-11-01 | Disposition: A | Discharge status: Home / Self Care | Payer: Medicaid Other | Attending: Emergency Medicine | Admitting: Emergency Medicine

## 2016-11-01 ENCOUNTER — Encounter (HOSPITAL_COMMUNITY): Payer: Self-pay | Admitting: *Deleted

## 2016-11-01 DIAGNOSIS — K649 Unspecified hemorrhoids: Secondary | ICD-10-CM | POA: Insufficient documentation

## 2016-11-01 DIAGNOSIS — Z79899 Other long term (current) drug therapy: Secondary | ICD-10-CM | POA: Insufficient documentation

## 2016-11-01 DIAGNOSIS — K600 Acute anal fissure: Secondary | ICD-10-CM | POA: Insufficient documentation

## 2016-11-01 DIAGNOSIS — F17210 Nicotine dependence, cigarettes, uncomplicated: Secondary | ICD-10-CM | POA: Insufficient documentation

## 2016-11-01 MED ORDER — HYDROCORTISONE 1 % EX CREA: TOPICAL_CREAM | CUTANEOUS | 0 refills | Status: DC

## 2016-11-01 NOTE — ED Triage Notes (Addendum)
Pt states had bowel movement on Monday and since has been having rectal pain, pt feels like there is a knot a back there.  Pt reports a little bit of blood.  No belly pain. Pt states whole body aches.  Also has fishy odor to vaginal area no discharge,  LMP last day yesterday

## 2016-11-01 NOTE — ED Provider Notes (Signed)
[facility]PT Provider Note   CSN: [id number] Arrival date & time: 11/01/16  7829     History   Chief Complaint Chief Complaint  Patient presents with  . Rectal Bleeding  . Generalized Body Aches    HPI Sabrina Rasmussen is a 26 y.o. female.  HPI Patient reports a discomfort around her anus over the past 2 days with a small amount of blood noted on the tissue.  No fevers or chills.  She does struggle with constipation but she reports not lately.  She denies recent diarrhea.  No history of significant GI bleeds.  She states it's painful when stools coming out.  No anal intercourse.  She also reports an ongoing fishy smell coming from her vagina but reports she was recently seen and evaluated in emergency department for this and told that she does not have bacterial vaginosis.  She has not followed up with a gynecologist.  She has no vaginal discharge.  No vaginal pain.  She reports she is currently not sexually active.   Past Medical History:  Diagnosis Date  . Anxiety   . Panic attack   . Pregnant state, incidental     There are no active problems to display for this patient.   Past Surgical History:  Procedure Laterality Date  . ABSCESS DRAINAGE    . CESAREAN SECTION      OB History    Gravida Para Term Preterm AB Living   1             SAB TAB Ectopic Multiple Live Births                   Home Medications    Prior to Admission medications   Medication Sig Start Date End Date Taking? Authorizing Provider  hydrocortisone cream 1 % Apply to affected area 2 times daily 11/01/16   Azalia Bilis, MD    Family History No family history on file.  Social History Social History  Substance Use Topics  . Smoking status: Current Every Day Smoker    Packs/day: 0.50    Types: Cigarettes  . Smokeless tobacco: Never Used  . Alcohol use No     Allergies   Patient has no known allergies.   Review of Systems Review of Systems  All other systems reviewed and  are negative.    Physical Exam Updated Vital Signs BP 120/85 (BP Location: Right Arm)   Pulse (!) 114   Temp 98.6 F (37 C) (Oral)   Resp 16   Ht  (1.6 m)   Wt 119 lb (54 kg)   LMP 10/27/2016   SpO2 100%   BMI 21.08 kg/m   Physical Exam  Constitutional: She is oriented to person, place, and time. She appears well-developed and well-nourished.  HENT:  Head: Normocephalic.  Eyes: EOM are normal.  Neck: Normal range of motion.  Pulmonary/Chest: Effort normal.  Abdominal: She exhibits no distension.  Genitourinary:  Genitourinary Comments: Chaperone present.  Small nonthrombosed external hemorrhoid with small adjacent anal fissure  Musculoskeletal: Normal range of motion.  Neurological: She is alert and oriented to person, place, and time.  Psychiatric: She has a normal mood and affect.  Nursing note and vitals reviewed.    ED Treatments / Results  Labs (all labs ordered are listed, but only abnormal results are displayed) Labs Reviewed  URINALYSIS, ROUTINE W REFLEX MICROSCOPIC  POC URINE PREG, ED    EKG  EKG Interpretation None  Radiology No results found.  Procedures Procedures (including critical care time)  Medications Ordered in ED Medications - No data to display   Initial Impression / Assessment and Plan / ED Course  I have reviewed the triage vital signs and the nursing notes.  Pertinent labs & imaging results that were available during my care of the patient were reviewed by me and considered in my medical decision making (see chart for details).     Sitz baths and hydrocortisone cream.  Primary care follow-up.  I do not think there is indication for acute pelvic examination when this is more of a recurrent and chronic issue.  I referred her to the women's outpatient clinic  Final Clinical Impressions(s) / ED Diagnoses   Final diagnoses:  Acute anal fissure  Hemorrhoids, unspecified hemorrhoid type    New Prescriptions New  Prescriptions   HYDROCORTISONE CREAM 1 %    Apply to affected area 2 times daily     Azalia Bilis, MD 11/01/16 [phone]

## 2016-11-01 NOTE — ED Notes (Signed)
Pt is in stable condition upon d/c and ambulates from ED. 

## 2016-11-30 ENCOUNTER — Inpatient Hospital Stay (HOSPITAL_COMMUNITY)
Admission: AD | Admit: 2016-11-30 | Discharge: 2016-11-30 | Disposition: A | Discharge status: Home / Self Care | Payer: Medicaid Other | Source: Ambulatory Visit | Attending: Family Medicine | Admitting: Family Medicine

## 2016-11-30 ENCOUNTER — Encounter (HOSPITAL_COMMUNITY): Payer: Self-pay | Admitting: *Deleted

## 2016-11-30 DIAGNOSIS — F17210 Nicotine dependence, cigarettes, uncomplicated: Secondary | ICD-10-CM | POA: Insufficient documentation

## 2016-11-30 DIAGNOSIS — O99331 Smoking (tobacco) complicating pregnancy, first trimester: Secondary | ICD-10-CM | POA: Insufficient documentation

## 2016-11-30 DIAGNOSIS — O26899 Other specified pregnancy related conditions, unspecified trimester: Secondary | ICD-10-CM

## 2016-11-30 DIAGNOSIS — O219 Vomiting of pregnancy, unspecified: Secondary | ICD-10-CM | POA: Insufficient documentation

## 2016-11-30 DIAGNOSIS — F410 Panic disorder [episodic paroxysmal anxiety] without agoraphobia: Secondary | ICD-10-CM | POA: Insufficient documentation

## 2016-11-30 DIAGNOSIS — R51 Headache: Secondary | ICD-10-CM | POA: Insufficient documentation

## 2016-11-30 DIAGNOSIS — R109 Unspecified abdominal pain: Secondary | ICD-10-CM | POA: Insufficient documentation

## 2016-11-30 DIAGNOSIS — Z3A01 Less than 8 weeks gestation of pregnancy: Secondary | ICD-10-CM | POA: Insufficient documentation

## 2016-11-30 DIAGNOSIS — O3680X0 Pregnancy with inconclusive fetal viability, not applicable or unspecified: Secondary | ICD-10-CM

## 2016-11-30 DIAGNOSIS — O26891 Other specified pregnancy related conditions, first trimester: Secondary | ICD-10-CM | POA: Insufficient documentation

## 2016-11-30 DIAGNOSIS — O99341 Other mental disorders complicating pregnancy, first trimester: Secondary | ICD-10-CM | POA: Insufficient documentation

## 2016-11-30 DIAGNOSIS — R110 Nausea: Secondary | ICD-10-CM

## 2016-11-30 DIAGNOSIS — Z9889 Other specified postprocedural states: Secondary | ICD-10-CM | POA: Insufficient documentation

## 2016-11-30 LAB — URINALYSIS, ROUTINE W REFLEX MICROSCOPIC
Bilirubin Urine: NEGATIVE
Glucose, UA: NEGATIVE mg/dL
Hgb urine dipstick: NEGATIVE
Ketones, ur: 5 mg/dL — AB
LEUKOCYTES UA: NEGATIVE
NITRITE: NEGATIVE
PH: 5 (ref 5.0–8.0)
Protein, ur: NEGATIVE mg/dL
SPECIFIC GRAVITY, URINE: 1.012 (ref 1.005–1.030)

## 2016-11-30 LAB — COMPREHENSIVE METABOLIC PANEL
ALBUMIN: 4.6 g/dL (ref 3.5–5.0)
ALT: 12 U/L — ABNORMAL LOW (ref 14–54)
ANION GAP: 7 (ref 5–15)
AST: 16 U/L (ref 15–41)
Alkaline Phosphatase: 64 U/L (ref 38–126)
BILIRUBIN TOTAL: 0.9 mg/dL (ref 0.3–1.2)
BUN: 11 mg/dL (ref 6–20)
CHLORIDE: 104 mmol/L (ref 101–111)
CO2: 25 mmol/L (ref 22–32)
Calcium: 9.5 mg/dL (ref 8.9–10.3)
Creatinine, Ser: 0.68 mg/dL (ref 0.44–1.00)
GFR calc Af Amer: >60 mL/min (ref >60)
GFR calc non Af Amer: >60 mL/min (ref >60)
GLUCOSE: 97 mg/dL (ref 65–99)
POTASSIUM: 3.5 mmol/L (ref 3.5–5.1)
SODIUM: 136 mmol/L (ref 135–145)
TOTAL PROTEIN: 8 g/dL (ref 6.5–8.1)

## 2016-11-30 LAB — CBC
HEMATOCRIT: 38.7 % (ref 36.0–46.0)
HEMOGLOBIN: 13.7 g/dL (ref 12.0–15.0)
MCH: 32.5 pg (ref 26.0–34.0)
MCHC: 35.4 g/dL (ref 30.0–36.0)
MCV: 91.7 fL (ref 78.0–100.0)
Platelets: 296 10*3/uL (ref 150–400)
RBC: 4.22 MIL/uL (ref 3.87–5.11)
RDW: 13.8 % (ref 11.5–15.5)
WBC: 5.9 10*3/uL (ref 4.0–10.5)

## 2016-11-30 LAB — POCT PREGNANCY, URINE: Preg Test, Ur: POSITIVE — AB

## 2016-11-30 LAB — WET PREP, GENITAL
CLUE CELLS WET PREP: NONE SEEN
SPERM: NONE SEEN
Trich, Wet Prep: NONE SEEN
Yeast Wet Prep HPF POC: NONE SEEN

## 2016-11-30 LAB — HCG, QUANTITATIVE, PREGNANCY: HCG, BETA CHAIN, QUANT, S: 64 m[IU]/mL — AB (ref <5)

## 2016-11-30 MED ORDER — PROMETHAZINE HCL 25 MG PO TABS: 25.0000 mg | ORAL_TABLET | Freq: Four times a day (QID) | ORAL | 0 refills | Status: DC | PRN

## 2016-11-30 NOTE — MAU Provider Note (Signed)
History     CSN: [id number]  Arrival date and time: 11/30/16 4098  Chief Complaint  Patient presents with  . Abdominal Pain   G2P1001 @[redacted]w[redacted]d  by sure LMP here with pregnancy sx. Nausea, HA, and LAP x3 days. She had +HPT today. HA is occipital and rates 5/10. Hasn't taken anything for it. Appetitie is poor, she is tolerating fluids but cannot eat foods. LAP is sharp, intermittent, central, and occurs only upon first awaking in am, rates 7/10. She did not try anything for either areas of pain. She also reports white, pink tinged vaginal discharge once that occurred 3 days ago. No new partner. Recent GC/CT was negative. This pregnancy was unplanned and pt is upset because it will affect her ability to perform her job.    OB History    Gravida Para Term Preterm AB Living   1 1 1          SAB TAB Ectopic Multiple Live Births                  Past Medical History:  Diagnosis Date  . Anxiety   . Panic attack   . Pregnant state, incidental     Past Surgical History:  Procedure Laterality Date  . ABSCESS DRAINAGE    . CESAREAN SECTION      No family history on file.  Social History  Substance Use Topics  . Smoking status: Current Every Day Smoker    Packs/day: 0.50    Types: Cigarettes  . Smokeless tobacco: Never Used  . Alcohol use No    Allergies: No Known Allergies  Prescriptions Prior to Admission  Medication Sig Dispense Refill Last Dose  . hydrocortisone cream 1 % Apply to affected area 2 times daily 15 g 0     Review of Systems  Constitutional: Negative for fever.  Gastrointestinal: Positive for abdominal pain and nausea.  Genitourinary: Positive for frequency and vaginal discharge. Negative for dysuria, hematuria, urgency and vaginal bleeding.  Neurological: Positive for headaches.   Physical Exam   Blood pressure 121/73, pulse 78, temperature 98.4 F (36.9 C), temperature source Oral, resp. rate 18, height 5\' 3"  (1.6 m), weight 52.5 kg (115 lb 12 oz), last  menstrual period 11/02/2016, unknown if currently breastfeeding.  Physical Exam  Nursing note and vitals reviewed. Constitutional: She is oriented to person, place, and time. She appears well-developed and well-nourished. No distress.  HENT:  Head: Normocephalic and atraumatic.  Neck: Normal range of motion.  Cardiovascular: Normal rate.   Respiratory: Effort normal.  GI: Soft. She exhibits no distension and no mass. There is no tenderness. There is no rebound and no guarding.  Genitourinary:  Genitourinary Comments: External: no lesions or erythema Vagina: rugated, parous, thin white discharge Uterus: non enlarged, anteverted, non tender, no CMT Adnexae: no masses, no tenderness left, no tenderness right   Musculoskeletal: Normal range of motion.  Neurological: She is alert and oriented to person, place, and time. No cranial nerve deficit.  Skin: Skin is warm and dry.  Psychiatric: She has a normal mood and affect.    Results for orders placed or performed during the hospital encounter of 11/30/16 (from the past 24 hour(s))  Urinalysis, Routine w reflex microscopic     Status: Abnormal   Collection Time: 11/30/16  8:05 PM  Result Value Ref Range   Color, Urine YELLOW YELLOW   APPearance CLEAR CLEAR   Specific Gravity, Urine 1.012 1.005 - 1.030   pH 5.0 5.0 - 8.0  Glucose, UA NEGATIVE NEGATIVE mg/dL   Hgb urine dipstick NEGATIVE NEGATIVE   Bilirubin Urine NEGATIVE NEGATIVE   Ketones, ur 5 (A) NEGATIVE mg/dL   Protein, ur NEGATIVE NEGATIVE mg/dL   Nitrite NEGATIVE NEGATIVE   Leukocytes, UA NEGATIVE NEGATIVE  Pregnancy, urine POC     Status: Abnormal   Collection Time: 11/30/16  8:12 PM  Result Value Ref Range   Preg Test, Ur POSITIVE (A) NEGATIVE  ABO/Rh     Status: None (Preliminary result)   Collection Time: 11/30/16  9:12 PM  Result Value Ref Range   ABO/RH(D) O POS   CBC     Status: None   Collection Time: 11/30/16  9:12 PM  Result Value Ref Range   WBC 5.9 4.0 -  10.5 K/uL   RBC 4.22 3.87 - 5.11 MIL/uL   Hemoglobin 13.7 12.0 - 15.0 g/dL   HCT 16.1 09.6 - 04.5 %   MCV 91.7 78.0 - 100.0 fL   MCH 32.5 26.0 - 34.0 pg   MCHC 35.4 30.0 - 36.0 g/dL   RDW 40.9 81.1 - 91.4 %   Platelets 296 150 - 400 K/uL  hCG, quantitative, pregnancy     Status: Abnormal   Collection Time: 11/30/16  9:12 PM  Result Value Ref Range   hCG, Beta Chain, Quant, S 64 (H) <5 mIU/mL  Comprehensive metabolic panel     Status: Abnormal   Collection Time: 11/30/16  9:12 PM  Result Value Ref Range   Sodium 136 135 - 145 mmol/L   Potassium 3.5 3.5 - 5.1 mmol/L   Chloride 104 101 - 111 mmol/L   CO2 25 22 - 32 mmol/L   Glucose, Bld 97 65 - 99 mg/dL   BUN 11 6 - 20 mg/dL   Creatinine, Ser 7.82 0.44 - 1.00 mg/dL   Calcium 9.5 8.9 - 95.6 mg/dL   Total Protein 8.0 6.5 - 8.1 g/dL   Albumin 4.6 3.5 - 5.0 g/dL   AST 16 15 - 41 U/L   ALT 12 (L) 14 - 54 U/L   Alkaline Phosphatase 64 38 - 126 U/L   Total Bilirubin 0.9 0.3 - 1.2 mg/dL   GFR calc non Af Amer >60 >60 mL/min   GFR calc Af Amer >60 >60 mL/min   Anion gap 7 5 - 15  Wet prep, genital     Status: Abnormal   Collection Time: 11/30/16 10:35 PM  Result Value Ref Range   Yeast Wet Prep HPF POC NONE SEEN NONE SEEN   Trich, Wet Prep NONE SEEN NONE SEEN   Clue Cells Wet Prep HPF POC NONE SEEN NONE SEEN   WBC, Wet Prep HPF POC FEW (A) NONE SEEN   Sperm NONE SEEN    MAU Course  Procedures  MDM Labs ordered and reviewed. Mild dehydration noted and likely source for HA. Will Rx Phenergan. No evidence of acute abdominal or pelvic process. Will not re-culture for GC/CT since recent negative result. Quant HCG low which likely correlates with early pregnancy but cannot r/o ectopic pregnancy therefore will follow quant in 48 hrs. Return precautions discussed. Stable for discharge home.   Assessment and Plan   1. Abdominal pain affecting pregnancy   2. Pregnancy related nausea, antepartum   3. Pregnancy headache in first trimester    4. Pregnancy of unknown anatomic location    Discharge home Increase hydration Rx Phenergan Follow up in 2 days in MAU for quant HCG Return/ectopic precautions  Allergies as of  11/30/2016   No Known Allergies     Medication List    STOP taking these medications   hydrocortisone cream 1 %     TAKE these medications   promethazine 25 MG tablet Commonly known as:  PHENERGAN Take 1 tablet (25 mg total) by mouth every 6 (six) hours as needed for nausea or vomiting.      Donette LarryMelanie Vivien Barretto, CNM 11/30/2016, 11:01 PM

## 2016-11-30 NOTE — MAU Note (Addendum)
PT SAYS SHE HAD  WHITE-PINKISH  D/C  WHEN SHE WIPED .  SHE DID HPT-   TODAY - POSITIVE .  NO  BIRTH CONTROL - LAST SEX- 5-10.   Sabrina Rasmussen.  HAS LOWER ABD PAIN - STARTED  5-24

## 2016-11-30 NOTE — Discharge Instructions (Signed)
Abdominal Pain During Pregnancy Belly (abdominal) pain is common during pregnancy. Most of the time, it is not a serious problem. Other times, it can be a sign that something is wrong with the pregnancy. Always tell your doctor if you have belly pain. Follow these instructions at home: Monitor your belly pain for any changes. The following actions may help you feel better:  Do not have sex (intercourse) or put anything in your vagina until you feel better.  Rest until your pain stops.  Drink clear fluids if you feel sick to your stomach (nauseous). Do not eat solid food until you feel better.  Only take medicine as told by your doctor.  Keep all doctor visits as told.  Get help right away if:  You are bleeding, leaking fluid, or pieces of tissue come out of your vagina.  You have more pain or cramping.  You keep throwing up (vomiting).  You have pain when you pee (urinate) or have blood in your pee.  You have a fever.  You do not feel your baby moving as much.  You feel very weak or feel like passing out.  You have trouble breathing, with or without belly pain.  You have a very bad headache and belly pain.  You have fluid leaking from your vagina and belly pain.  You keep having watery poop (diarrhea).  Your belly pain does not go away after resting, or the pain gets worse. This information is not intended to replace advice given to you by your health care provider. Make sure you discuss any questions you have with your health care provider. Document Released: 06/07/2009 Document Revised: 01/26/2016 Document Reviewed: 01/16/2013 Elsevier Interactive Patient Education  2018 [employer]  Morning Sickness Morning sickness is when you feel sick to your stomach (nauseous) during pregnancy. This nauseous feeling may or may not come with vomiting. It often occurs in the morning but can be a problem any time of day. Morning sickness is most common during the first trimester,  but it may continue throughout pregnancy. While morning sickness is unpleasant, it is usually harmless unless you develop severe and continual vomiting (hyperemesis gravidarum). This condition requires more intense treatment. What are the causes? The cause of morning sickness is not completely known but seems to be related to normal hormonal changes that occur in pregnancy. What increases the risk? You are at greater risk if you:  Experienced nausea or vomiting before your pregnancy.  Had morning sickness during a previous pregnancy.  Are pregnant with more than one baby, such as twins.  How is this treated? Do not use any medicines (prescription, over-the-counter, or herbal) for morning sickness without first talking to your health care provider. Your health care provider may prescribe or recommend:  Vitamin B6 supplements.  Anti-nausea medicines.  The herbal medicine ginger.  Follow these instructions at home:  Only take over-the-counter or prescription medicines as directed by your health care provider.  Taking multivitamins before getting pregnant can prevent or decrease the severity of morning sickness in most women.  Eat a piece of dry toast or unsalted crackers before getting out of bed in the morning.  Eat five or six small meals a day.  Eat dry and bland foods (rice, baked potato). Foods high in carbohydrates are often helpful.  Do not drink liquids with your meals. Drink liquids between meals.  Avoid greasy, fatty, and spicy foods.  Get someone to cook for you if the smell of any food causes nausea  and vomiting.  If you feel nauseous after taking prenatal vitamins, take the vitamins at night or with a snack.  Snack on protein foods (nuts, yogurt, cheese) between meals if you are hungry.  Eat unsweetened gelatins for desserts.  Wearing an acupressure wristband (worn for sea sickness) may be helpful.  Acupuncture may be helpful.  Do not smoke.  Get a  humidifier to keep the air in your house free of odors.  Get plenty of fresh air. Contact a health care provider if:  Your home remedies are not working, and you need medicine.  You feel dizzy or lightheaded.  You are losing weight. Get help right away if:  You have persistent and uncontrolled nausea and vomiting.  You pass out (faint). This information is not intended to replace advice given to you by your health care provider. Make sure you discuss any questions you have with your health care provider. Document Released: 08/10/2006 Document Revised: 11/25/2015 Document Reviewed: 12/04/2012 Elsevier Interactive Patient Education  2017 [employer].

## 2016-11-30 NOTE — MAU Note (Signed)
Discharge instructions reviewed with patient, patient verbalized understanding.  E-signature disconnected, pt. Signed hard copy, hard copy sent to medical records.

## 2016-12-01 ENCOUNTER — Encounter (HOSPITAL_COMMUNITY): Payer: Self-pay

## 2016-12-01 DIAGNOSIS — O200 Threatened abortion: Secondary | ICD-10-CM | POA: Insufficient documentation

## 2016-12-01 DIAGNOSIS — Z87891 Personal history of nicotine dependence: Secondary | ICD-10-CM | POA: Insufficient documentation

## 2016-12-01 DIAGNOSIS — Z3A00 Weeks of gestation of pregnancy not specified: Secondary | ICD-10-CM | POA: Insufficient documentation

## 2016-12-01 LAB — ABO/RH: ABO/RH(D): O POS

## 2016-12-01 NOTE — ED Triage Notes (Signed)
Pt states she is [redacted] weeks pregnant and started having abdominal cramping with blood clots; pt very tearful at triage; pt a&ox4 on arrival. Pt states pain 8/10 on arrival.

## 2016-12-02 ENCOUNTER — Encounter (HOSPITAL_COMMUNITY): Payer: Self-pay | Admitting: *Deleted

## 2016-12-02 ENCOUNTER — Emergency Department (HOSPITAL_COMMUNITY): Payer: Self-pay

## 2016-12-02 ENCOUNTER — Emergency Department (HOSPITAL_COMMUNITY)
Admission: EM | Admit: 2016-12-02 | Discharge: 2016-12-02 | Disposition: A | Discharge status: Home / Self Care | Payer: Self-pay | Attending: Emergency Medicine | Admitting: Emergency Medicine

## 2016-12-02 ENCOUNTER — Inpatient Hospital Stay (HOSPITAL_COMMUNITY)
Admission: AD | Admit: 2016-12-02 | Discharge: 2016-12-02 | Disposition: A | Discharge status: Home / Self Care | Payer: Medicaid Other | Source: Ambulatory Visit | Attending: Obstetrics & Gynecology | Admitting: Obstetrics & Gynecology

## 2016-12-02 DIAGNOSIS — O039 Complete or unspecified spontaneous abortion without complication: Secondary | ICD-10-CM

## 2016-12-02 DIAGNOSIS — O200 Threatened abortion: Secondary | ICD-10-CM

## 2016-12-02 LAB — CBC
HEMATOCRIT: 37.8 % (ref 36.0–46.0)
Hemoglobin: 12.9 g/dL (ref 12.0–15.0)
MCH: 31.6 pg (ref 26.0–34.0)
MCHC: 34.1 g/dL (ref 30.0–36.0)
MCV: 92.6 fL (ref 78.0–100.0)
Platelets: 296 10*3/uL (ref 150–400)
RBC: 4.08 MIL/uL (ref 3.87–5.11)
RDW: 13.5 % (ref 11.5–15.5)
WBC: 5.9 10*3/uL (ref 4.0–10.5)

## 2016-12-02 LAB — RAPID URINE DRUG SCREEN, HOSP PERFORMED
AMPHETAMINES: NOT DETECTED
BARBITURATES: NOT DETECTED
Benzodiazepines: POSITIVE — AB
COCAINE: NOT DETECTED
Opiates: NOT DETECTED
TETRAHYDROCANNABINOL: POSITIVE — AB

## 2016-12-02 LAB — I-STAT BETA HCG BLOOD, ED (MC, WL, AP ONLY): HCG, QUANTITATIVE: 48.1 m[IU]/mL — AB (ref <5)

## 2016-12-02 MED ORDER — OXYCODONE-ACETAMINOPHEN 5-325 MG PO TABS
2.0000 | ORAL_TABLET | Freq: Once | ORAL | Status: AC
  Administered 2016-12-02: 2 via ORAL
  Filled 2016-12-02: qty 2

## 2016-12-02 MED ORDER — OXYCODONE-ACETAMINOPHEN 5-325 MG PO TABS: 2.0000 | ORAL_TABLET | ORAL | 0 refills | Status: DC | PRN

## 2016-12-02 NOTE — Discharge Instructions (Signed)

## 2016-12-02 NOTE — ED Provider Notes (Addendum)
[facility] Provider Note   CSN: [id number] Arrival date & time: 12/01/16  2310 By signing my name below, I, Sabrina Rasmussen, attest that this documentation has been prepared under the direction and in the presence of Sabrina Bilis, MD . Electronically Signed: Levon Rasmussen, Scribe. 12/02/2016. 2:28 AM.   History   Chief Complaint Chief Complaint  Patient presents with  . Abdominal Cramping  . Vaginal Bleeding  . Threatened Miscarriage    HPI Sabrina Rasmussen is G2:P1 26 y.o. female who presents to the Emergency Department complaining of intermittent, moderate, cramping abdominal pain onset today. No OTC treatments tried for these symptoms PTA.  No alleviating or modifying factors noted. She also reports associated vaginal bleeding today. Pt had a positive pregnancy test at home and at [facility] yesterday. Pt states she carried her first child to term. No hx of miscarriage. Pt has no other acute complaints or associated symptoms at this time.    The history is provided by the patient. No language interpreter was used.    Past Medical History:  Diagnosis Date  . Anxiety   . Panic attack   . Pregnant state, incidental     There are no active problems to display for this patient.  Past Surgical History:  Procedure Laterality Date  . ABSCESS DRAINAGE    . CESAREAN SECTION      OB History    Gravida Para Term Preterm AB Living   2 1 1          SAB TAB Ectopic Multiple Live Births                   Home Medications    Prior to Admission medications   Medication Sig Start Date End Date Taking? Authorizing Provider  promethazine (PHENERGAN) 25 MG tablet Take 1 tablet (25 mg total) by mouth every 6 (six) hours as needed for nausea or vomiting. 11/30/16   Donette Larry, CNM    Family History No family history on file.  Social History Social History  Substance Use Topics  . Smoking status: Former Smoker    Packs/day: 0.50    Types: Cigarettes  .  Smokeless tobacco: Never Used  . Alcohol use No     Allergies   Patient has no known allergies.   Review of Systems Review of Systems All systems reviewed and are negative for acute change except as noted in the HPI.  Physical Exam Updated Vital Signs BP 132/84 (BP Location: Left Arm)   Pulse 72   Temp 98.9 F (37.2 C) (Oral)   Resp 16   LMP 11/02/2016   SpO2 100%   Physical Exam  Constitutional: She is oriented to person, place, and time. She appears well-developed and well-nourished.  HENT:  Head: Normocephalic.  Eyes: EOM are normal.  Neck: Normal range of motion.  Pulmonary/Chest: Effort normal.  Abdominal: Soft. She exhibits no distension. There is no tenderness.  Musculoskeletal: Normal range of motion.  Neurological: She is alert and oriented to person, place, and time.  Psychiatric: She has a normal mood and affect.  Nursing note and vitals reviewed.  ED Treatments / Results  DIAGNOSTIC STUDIES:  Oxygen Saturation is 100% on RA, normal by my interpretation.    COORDINATION OF CARE:  2:59 AM Discussed treatment plan with pt at bedside and pt agreed to plan.    Labs (all labs ordered are listed, but only abnormal results are displayed) Labs Reviewed  I-STAT BETA HCG BLOOD, ED ([facility],  [facility], [facility] ONLY) - Abnormal; Notable for the following:       Result Value   I-stat hCG, quantitative 48.1 (*)    All other components within normal limits  CBC    EKG  EKG Interpretation None       Radiology [place] Ob Comp Less 14 Wks  Result Date: 12/02/2016 CLINICAL DATA:  Acute onset of vaginal bleeding.  Initial encounter. EXAM: OBSTETRIC <14 WK US AND TRANSVAGINAL OB US TECHNIQUE: Both transabdominal and transvaginal ultrasound examinations were performed for complete evaluation of the gestation as well as the maternal uterus, adnexal regions, and pelvic cul-de-sac. Transvaginal technique was performed to assess early pregnancy. COMPARISON:  CT of the abdomen and pelvis  performed 08/07/2016 FINDINGS: Intrauterine gestational sac: There is question of 2 tiny cystic foci within the endometrial canal, difficult to fully characterize. Yolk sac:  N/A Embryo:  N/A Subchorionic hemorrhage:  None visualized. Maternal uterus/adnexae: The uterus is otherwise unremarkable in appearance. The ovaries are within normal limits. The right ovary measures 2.9 x 1.5 x 2.1 cm, while the left ovary measures 3.3 x 1.5 x 2.3 cm. No suspicious adnexal masses are seen; there is no evidence for ovarian torsion. No free fluid is seen within the pelvic cul-de-sac. IMPRESSION: Question of 2 tiny cystic foci within the endometrial canal, not well characterized. These are nonspecific, as the patient's quantitative beta HCG of 48.1 should be too low to visualize an intrauterine pregnancy. If the patient's quantitative beta HCG continues to trend upward, follow-up pelvic ultrasound could be performed in 2 weeks for further evaluation. Electronically Signed   By: Sabrina Rasmussen  Chang M.D.   On: 12/02/2016 04:34   [place] Ob Transvaginal  Result Date: 12/02/2016 CLINICAL DATA:  Acute onset of vaginal bleeding.  Initial encounter. EXAM: OBSTETRIC <14 WK US AND TRANSVAGINAL OB US TECHNIQUE: Both transabdominal and transvaginal ultrasound examinations were performed for complete evaluation of the gestation as well as the maternal uterus, adnexal regions, and pelvic cul-de-sac. Transvaginal technique was performed to assess early pregnancy. COMPARISON:  CT of the abdomen and pelvis performed 08/07/2016 FINDINGS: Intrauterine gestational sac: There is question of 2 tiny cystic foci within the endometrial canal, difficult to fully characterize. Yolk sac:  N/A Embryo:  N/A Subchorionic hemorrhage:  None visualized. Maternal uterus/adnexae: The uterus is otherwise unremarkable in appearance. The ovaries are within normal limits. The right ovary measures 2.9 x 1.5 x 2.1 cm, while the left ovary measures 3.3 x 1.5 x 2.3 cm. No  suspicious adnexal masses are seen; there is no evidence for ovarian torsion. No free fluid is seen within the pelvic cul-de-sac. IMPRESSION: Question of 2 tiny cystic foci within the endometrial canal, not well characterized. These are nonspecific, as the patient's quantitative beta HCG of 48.1 should be too low to visualize an intrauterine pregnancy. If the patient's quantitative beta HCG continues to trend upward, follow-up pelvic ultrasound could be performed in 2 weeks for further evaluation. Electronically Signed   By: Sabrina Rasmussen  Chang M.D.   On: 12/02/2016 04:34    Procedures Procedures (including critical care time)  Medications Ordered in ED Medications - No data to display   Initial Impression / Assessment and Plan / ED Course  I have reviewed the triage vital signs and the nursing notes.  Pertinent labs & imaging results that were available during my care of the patient were reviewed by me and considered in my medical decision making (see chart for details).     Likely threatened miscarriage.  Patient will need repeat beta hCG Quant 72 hours.  Patient understands return to the maternity assessment unit for any new or worsening symptoms  Final Clinical Impressions(s) / ED Diagnoses   Final diagnoses:  Threatened miscarriage    New Prescriptions New Prescriptions   No medications on file   I personally performed the services described in this documentation, which was scribed in my presence. The recorded information has been reviewed and is accurate.        Sabrina Bilis, MD 12/02/16 Aleda Grana    Sabrina Bilis, MD 12/02/16 [phone]

## 2016-12-02 NOTE — MAU Note (Signed)
Pt was seen on Thursday for abd pain. Pregnancy confirmed. Started having increased abd pain and bleeding last [facility] Ws sent home. Pain got worse today . Passed a quarter sized clot at home

## 2016-12-02 NOTE — ED Notes (Signed)
Pt stable, ambulatory, states understanding of discharge instructions 

## 2016-12-02 NOTE — ED Notes (Signed)
Pt informed that she will be next one back.

## 2016-12-02 NOTE — ED Notes (Signed)
Pt up at nurse first requesting update. Informed she would be next one back when room becomes available.

## 2016-12-02 NOTE — MAU Provider Note (Signed)
History   G2P1001 @ 4.2 wks in with abd pain, bleeding in early pregnancy that started today early..  CSN: [id number]  Arrival date & time 12/02/16  1740   None     Chief Complaint  Patient presents with  . Vaginal Bleeding  . Abdominal Pain    HPI  Past Medical History:  Diagnosis Date  . Anxiety   . Panic attack   . Pregnant state, incidental     Past Surgical History:  Procedure Laterality Date  . ABSCESS DRAINAGE    . CESAREAN SECTION      No family history on file.  Social History  Substance Use Topics  . Smoking status: Former Smoker    Packs/day: 0.50    Types: Cigarettes  . Smokeless tobacco: Never Used  . Alcohol use No    OB History    Gravida Para Term Preterm AB Living   2 1 1     1    SAB TAB Ectopic Multiple Live Births           1      Review of Systems  Constitutional: Negative.   HENT: Negative.   Eyes: Negative.   Respiratory: Negative.   Cardiovascular: Negative.   Gastrointestinal: Positive for abdominal pain.  Endocrine: Negative.   Genitourinary: Positive for vaginal bleeding.  Musculoskeletal: Negative.   Skin: Negative.   Allergic/Immunologic: Negative.   Neurological: Negative.   Hematological: Negative.   Psychiatric/Behavioral: Negative.     Allergies  Patient has no known allergies.  Home Medications    BP 121/78   Pulse 90   Temp 99.3 F (37.4 C) (Oral)   Resp 18   LMP 11/02/2016   Physical Exam  Constitutional: She is oriented to person, place, and time. She appears well-developed and well-nourished.  HENT:  Head: Normocephalic.  Eyes: Pupils are equal, round, and reactive to light.  Cardiovascular: Normal rate, regular rhythm, normal heart sounds and intact distal pulses.   Pulmonary/Chest: Effort normal and breath sounds normal.  Abdominal: Soft. Bowel sounds are normal.  Genitourinary:  Genitourinary Comments: sm amt dark vag bleeding  Musculoskeletal: Normal range of motion.  Neurological: She is  alert and oriented to person, place, and time. She has normal reflexes.  Skin: Skin is warm and dry.  Psychiatric: She has a normal mood and affect. Her behavior is normal. Judgment and thought content normal.    MAU Course  Procedures (including critical care time)  Labs Reviewed  RAPID URINE DRUG SCREEN, HOSP PERFORMED   US Ob Comp Less 14 Wks  Result Date: 12/02/2016 CLINICAL DATA:  Acute onset of vaginal bleeding.  Initial encounter. EXAM: OBSTETRIC <14 WK [place] AND TRANSVAGINAL OB US TECHNIQUE: Both transabdominal and transvaginal ultrasound examinations were performed for complete evaluation of the gestation as well as the maternal uterus, adnexal regions, and pelvic cul-de-sac. Transvaginal technique was performed to assess early pregnancy. COMPARISON:  CT of the abdomen and pelvis performed 08/07/2016 FINDINGS: Intrauterine gestational sac: There is question of 2 tiny cystic foci within the endometrial canal, difficult to fully characterize. Yolk sac:  N/A Embryo:  N/A Subchorionic hemorrhage:  None visualized. Maternal uterus/adnexae: The uterus is otherwise unremarkable in appearance. The ovaries are within normal limits. The right ovary measures 2.9 x 1.5 x 2.1 cm, while the left ovary measures 3.3 x 1.5 x 2.3 cm. No suspicious adnexal masses are seen; there is no evidence for ovarian torsion. No free fluid is seen within the pelvic cul-de-sac. IMPRESSION: Question  of 2 tiny cystic foci within the endometrial canal, not well characterized. These are nonspecific, as the patient's quantitative beta HCG of 48.1 should be too low to visualize an intrauterine pregnancy. If the patient's quantitative beta HCG continues to trend upward, follow-up pelvic ultrasound could be performed in 2 weeks for further evaluation. Electronically Signed   By: Roanna RaiderJeffery  Chang M.D.   On: 12/02/2016 04:34   [place] Ob Transvaginal  Result Date: 12/02/2016 CLINICAL DATA:  Acute onset of vaginal bleeding.  Initial encounter.  EXAM: OBSTETRIC <14 WK US AND TRANSVAGINAL OB US TECHNIQUE: Both transabdominal and transvaginal ultrasound examinations were performed for complete evaluation of the gestation as well as the maternal uterus, adnexal regions, and pelvic cul-de-sac. Transvaginal technique was performed to assess early pregnancy. COMPARISON:  CT of the abdomen and pelvis performed 08/07/2016 FINDINGS: Intrauterine gestational sac: There is question of 2 tiny cystic foci within the endometrial canal, difficult to fully characterize. Yolk sac:  N/A Embryo:  N/A Subchorionic hemorrhage:  None visualized. Maternal uterus/adnexae: The uterus is otherwise unremarkable in appearance. The ovaries are within normal limits. The right ovary measures 2.9 x 1.5 x 2.1 cm, while the left ovary measures 3.3 x 1.5 x 2.3 cm. No suspicious adnexal masses are seen; there is no evidence for ovarian torsion. No free fluid is seen within the pelvic cul-de-sac. IMPRESSION: Question of 2 tiny cystic foci within the endometrial canal, not well characterized. These are nonspecific, as the patient's quantitative beta HCG of 48.1 should be too low to visualize an intrauterine pregnancy. If the patient's quantitative beta HCG continues to trend upward, follow-up pelvic ultrasound could be performed in 2 weeks for further evaluation. Electronically Signed   By: Roanna RaiderJeffery  Chang M.D.   On: 12/02/2016 04:34     1. Miscarriage       MDM  Sterile spec exam os closed, sm amt dark bleeding. Uterus non tender. Discussed at length lab results with pt and the fact she was having a miscarriage. Will treat cramping and d/c home since pt desires no intervention.

## 2016-12-04 ENCOUNTER — Encounter (HOSPITAL_COMMUNITY): Payer: Self-pay | Admitting: Emergency Medicine

## 2016-12-04 ENCOUNTER — Encounter: Payer: Medicaid Other | Admitting: Obstetrics & Gynecology

## 2016-12-04 ENCOUNTER — Emergency Department (HOSPITAL_COMMUNITY)
Admission: EM | Admit: 2016-12-04 | Discharge: 2016-12-04 | Disposition: A | Discharge status: Home / Self Care | Payer: Medicaid Other | Attending: Emergency Medicine | Admitting: Emergency Medicine

## 2016-12-04 DIAGNOSIS — R109 Unspecified abdominal pain: Secondary | ICD-10-CM

## 2016-12-04 DIAGNOSIS — Z79899 Other long term (current) drug therapy: Secondary | ICD-10-CM | POA: Insufficient documentation

## 2016-12-04 DIAGNOSIS — Z87891 Personal history of nicotine dependence: Secondary | ICD-10-CM | POA: Insufficient documentation

## 2016-12-04 DIAGNOSIS — O200 Threatened abortion: Secondary | ICD-10-CM | POA: Insufficient documentation

## 2016-12-04 DIAGNOSIS — O039 Complete or unspecified spontaneous abortion without complication: Secondary | ICD-10-CM

## 2016-12-04 LAB — URINALYSIS, ROUTINE W REFLEX MICROSCOPIC
Bacteria, UA: NONE SEEN
Bilirubin Urine: NEGATIVE
Glucose, UA: NEGATIVE mg/dL
Ketones, ur: NEGATIVE mg/dL
Leukocytes, UA: NEGATIVE
Nitrite: NEGATIVE
PH: 5 (ref 5.0–8.0)
Protein, ur: NEGATIVE mg/dL
SPECIFIC GRAVITY, URINE: 1.024 (ref 1.005–1.030)

## 2016-12-04 LAB — CBC WITH DIFFERENTIAL/PLATELET
Basophils Absolute: 0 10*3/uL (ref 0.0–0.1)
Basophils Relative: 0 %
EOS ABS: 0.1 10*3/uL (ref 0.0–0.7)
Eosinophils Relative: 2 %
HCT: 35 % — ABNORMAL LOW (ref 36.0–46.0)
Hemoglobin: 12 g/dL (ref 12.0–15.0)
LYMPHS ABS: 1.8 10*3/uL (ref 0.7–4.0)
Lymphocytes Relative: 46 %
MCH: 31.7 pg (ref 26.0–34.0)
MCHC: 34.3 g/dL (ref 30.0–36.0)
MCV: 92.3 fL (ref 78.0–100.0)
MONO ABS: 0.3 10*3/uL (ref 0.1–1.0)
MONOS PCT: 7 %
Neutro Abs: 1.8 10*3/uL (ref 1.7–7.7)
Neutrophils Relative %: 45 %
Platelets: 266 10*3/uL (ref 150–400)
RBC: 3.79 MIL/uL — ABNORMAL LOW (ref 3.87–5.11)
RDW: 13.5 % (ref 11.5–15.5)
WBC: 4 10*3/uL (ref 4.0–10.5)

## 2016-12-04 LAB — COMPREHENSIVE METABOLIC PANEL
ALBUMIN: 3.5 g/dL (ref 3.5–5.0)
ALK PHOS: 53 U/L (ref 38–126)
ALT: 10 U/L — ABNORMAL LOW (ref 14–54)
ANION GAP: 8 (ref 5–15)
AST: 15 U/L (ref 15–41)
BILIRUBIN TOTAL: 0.2 mg/dL — AB (ref 0.3–1.2)
BUN: 10 mg/dL (ref 6–20)
CO2: 25 mmol/L (ref 22–32)
Calcium: 8.8 mg/dL — ABNORMAL LOW (ref 8.9–10.3)
Chloride: 106 mmol/L (ref 101–111)
Creatinine, Ser: 0.68 mg/dL (ref 0.44–1.00)
GFR calc non Af Amer: >60 mL/min (ref >60)
GLUCOSE: 84 mg/dL (ref 65–99)
POTASSIUM: 3.2 mmol/L — AB (ref 3.5–5.1)
SODIUM: 139 mmol/L (ref 135–145)
TOTAL PROTEIN: 5.9 g/dL — AB (ref 6.5–8.1)

## 2016-12-04 LAB — I-STAT BETA HCG BLOOD, ED (MC, WL, AP ONLY): HCG, QUANTITATIVE: 16 m[IU]/mL — AB (ref <5)

## 2016-12-04 LAB — LIPASE, BLOOD: Lipase: 23 U/L (ref 11–51)

## 2016-12-04 MED ORDER — POTASSIUM CHLORIDE CRYS ER 20 MEQ PO TBCR
40.0000 meq | EXTENDED_RELEASE_TABLET | Freq: Once | ORAL | Status: AC
  Administered 2016-12-04: 40 meq via ORAL
  Filled 2016-12-04: qty 2

## 2016-12-04 MED ORDER — IBUPROFEN 600 MG PO TABS: 600.0000 mg | ORAL_TABLET | Freq: Four times a day (QID) | ORAL | 0 refills | Status: DC | PRN

## 2016-12-04 MED ORDER — ONDANSETRON HCL 4 MG/2ML IJ SOLN
4.0000 mg | Freq: Once | INTRAMUSCULAR | Status: AC
  Administered 2016-12-04: 4 mg via INTRAVENOUS
  Filled 2016-12-04: qty 2

## 2016-12-04 MED ORDER — MORPHINE SULFATE (PF) 2 MG/ML IV SOLN
4.0000 mg | Freq: Once | INTRAVENOUS | Status: AC
  Administered 2016-12-04: 4 mg via INTRAVENOUS
  Filled 2016-12-04: qty 2

## 2016-12-04 NOTE — ED Notes (Signed)
Bed: WA20 Expected date:  Expected time:  Means of arrival:  Comments: Pt changing

## 2016-12-04 NOTE — ED Provider Notes (Signed)
Sabrina Rasmussen Provider Note   CSN: [id number] Arrival date & time: 12/04/16  1618     History   Chief Complaint Chief Complaint  Patient presents with  . Abdominal Cramping    HPI LUN MURO is a 26 y.o. female.  HPI   Patient is a 26 year old female with no pertinent past medical history presents the ED with complaints of lower abdominal cramping. Patient reports 5 days ago she took a home pregnancy test which was positive. She states the following day she began having mild lower abdominal cramping with associated vaginal bleeding. Endorses that showed blood clots but denies passage of tissue. She states she has been seen in the ED multiple times at [facility] and [facility] since onset of symptoms. Patient states she was last seen at [facility] 2 days ago due to worsening cramping and continued vaginal bleeding. She states she was discharged home with pain meds which she has been taking without relief. Patient reports her vaginal bleeding has since resolved but she has continued to have intermittent worsening lower abdominal cramping. Denies fever, chills, chest pain, shortness of breath, nausea, vomiting, diarrhea, urinary symptoms, blood in urine or stool, vaginal discharge. Endorses abdominal surgical history of cesarean section. G2P1A0.   Past Medical History:  Diagnosis Date  . Anxiety   . Panic attack   . Pregnant state, incidental     There are no active problems to display for this patient.   Past Surgical History:  Procedure Laterality Date  . ABSCESS DRAINAGE    . CESAREAN SECTION      OB History    Gravida Para Term Preterm AB Living   2 1 1     1    SAB TAB Ectopic Multiple Live Births           1       Home Medications    Prior to Admission medications   Medication Sig Start Date End Date Taking? Authorizing Provider  oxyCODONE-acetaminophen (PERCOCET/ROXICET) 5-325 MG tablet Take 2 tablets by mouth every 4 (four) hours as needed  for severe pain. 12/02/16  Yes Montez Morita, CNM  prenatal vitamin w/FE, FA (PRENATAL 1 + 1) 27-1 MG TABS tablet Take 1 tablet by mouth daily at 12 noon.   Yes [provider]  ibuprofen (ADVIL,MOTRIN) 600 MG tablet Take 1 tablet (600 mg total) by mouth every 6 (six) hours as needed. 12/04/16   Barrett Henle, PA-C    Family History No family history on file.  Social History Social History  Substance Use Topics  . Smoking status: Former Smoker    Packs/day: 0.50    Types: Cigarettes  . Smokeless tobacco: Never Used  . Alcohol use No     Allergies   Patient has no known allergies.   Review of Systems Review of Systems  Gastrointestinal: Positive for abdominal pain.  All other systems reviewed and are negative.    Physical Exam Updated Vital Signs BP 105/67   Pulse 77   Temp 98.5 F (36.9 C) (Oral)   Resp 16   LMP 11/02/2016   SpO2 99%   Physical Exam  Constitutional: She is oriented to person, place, and time. She appears well-developed and well-nourished. No distress.  HENT:  Head: Normocephalic and atraumatic.  Mouth/Throat: Uvula is midline, oropharynx is clear and moist and mucous membranes are normal. No oropharyngeal exudate, posterior oropharyngeal edema, posterior oropharyngeal erythema or tonsillar abscesses. No tonsillar exudate.  Eyes: Conjunctivae and EOM  are normal. Right eye exhibits no discharge. Left eye exhibits no discharge. No scleral icterus.  Neck: Normal range of motion. Neck supple.  Cardiovascular: Normal rate, regular rhythm, normal heart sounds and intact distal pulses.   Pulmonary/Chest: Effort normal and breath sounds normal. No respiratory distress. She has no wheezes. She has no rales. She exhibits no tenderness.  Abdominal: Soft. Normal appearance and bowel sounds are normal. She exhibits no distension and no mass. There is no tenderness. There is no rigidity, no rebound, no guarding and no CVA tenderness. No hernia.    Musculoskeletal: Normal range of motion. She exhibits no edema.  Neurological: She is alert and oriented to person, place, and time.  Skin: Skin is warm and dry. She is not diaphoretic.  Nursing note and vitals reviewed.    ED Treatments / Results  Labs (all labs ordered are listed, but only abnormal results are displayed) Labs Reviewed  CBC WITH DIFFERENTIAL/PLATELET - Abnormal; Notable for the following:       Result Value   RBC 3.79 (*)    HCT 35.0 (*)    All other components within normal limits  COMPREHENSIVE METABOLIC PANEL - Abnormal; Notable for the following:    Potassium 3.2 (*)    Calcium 8.8 (*)    Total Protein 5.9 (*)    ALT 10 (*)    Total Bilirubin 0.2 (*)    All other components within normal limits  URINALYSIS, ROUTINE W REFLEX MICROSCOPIC - Abnormal; Notable for the following:    APPearance HAZY (*)    Hgb urine dipstick SMALL (*)    Squamous Epithelial / LPF 0-5 (*)    All other components within normal limits  I-STAT BETA HCG BLOOD, ED ([facility], [facility], [facility] ONLY) - Abnormal; Notable for the following:    I-stat hCG, quantitative 16.0 (*)    All other components within normal limits  LIPASE, BLOOD    EKG  EKG Interpretation None       Radiology No results found.  Procedures Procedures (including critical care time)  Medications Ordered in ED Medications  ondansetron (ZOFRAN) injection 4 mg (4 mg Intravenous Given 12/04/16 1743)  morphine 2 MG/ML injection 4 mg (4 mg Intravenous Given 12/04/16 1743)  potassium chloride SA (K-DUR,KLOR-CON) CR tablet 40 mEq (40 mEq Oral Given 12/04/16 1834)     Initial Impression / Assessment and Plan / ED Course  I have reviewed the triage vital signs and the nursing notes.  Pertinent labs & imaging results that were available during my care of the patient were reviewed by me and considered in my medical decision making (see chart for details).     Patient presents with worsening intermittent lower abdominal pain  which she notes when present for the past few days. Reports having vaginal bleeding a few days ago which has since resolved and states she has been seen in the ED multiple times and diagnosed with miscarriage. VSS. Exam unremarkable. Abdomen soft and nontender. Patient given IV fluids, pain meds and antiemetics.  Chart review shows patient was seen at [facility] 11/30/16 with positive pregnancy and abdominal pain. Labs showed beta hcg 64, mild dehydration. Pt given rx for Phenergan and recommend f/u quant in 48hrs to f/u ectopic. It was then seen in the [facility] needing on 12/02/16 for continued symptoms and vaginal bleeding. Labs showed beta hCG 48. Transvaginal ultrasound revealed 2 tiny cystic foci within the endometrial canal; recommend repeat beta hCG and follow-up ultrasound in 2 weeks for  further evaluation. Patient discharged with likely threatened miscarriage and advised to have repeat beta hCG in 72 hours. Patient was then [facility] that evening on 12/02/16. Sterile speculum exam revealed closed cervical os, small amount of dark bleeding, uterus nontender. Patient discharged home with pain meds for cramping.  Labs in the ED today revealed beta hCG 16; confirmed miscarriage. Potassium 3.2, patient given oral potassium supplement in the ED. Remaining labs and urine unremarkable. On reevaluation patient is sitting resting comfortably in bed. Patient tolerating PO. Discussed results and plan for discharge with patient. Plan to discharge patient home with outpatient OB/GYN follow-up. Discussed return precautions.  Final Clinical Impressions(s) / ED Diagnoses   Final diagnoses:  Abdominal cramping  Miscarriage    New Prescriptions New Prescriptions   IBUPROFEN (ADVIL,MOTRIN) 600 MG TABLET    Take 1 tablet (600 mg total) by mouth every 6 (six) hours as needed.     Barrett Henleadeau, Lakshmi Sundeen Elizabeth, PA-C 12/04/16 1857    Clarene DukeLittle, Ambrose Finlandachel Morgan, MD 12/06/16 [phone]

## 2016-12-04 NOTE — Discharge Instructions (Signed)
Take your medication as prescribed as needed for pain relief. I recommend following up with the gynecology clinic listed below within the next week for follow-up evaluation if your symptoms continue. Please return to the Emergency Department if symptoms worsen or new onset of fever, chest pain, difficulty breathing, new/worsening abdominal pain, vomiting, vaginal bleeding.

## 2016-12-04 NOTE — ED Triage Notes (Signed)
Per pt, states she was told she was having a miscarriage one the 2nd-states she is still having cramping although the bleeding has stopped-states she has not f/u with OBGYN

## 2017-01-18 ENCOUNTER — Inpatient Hospital Stay (HOSPITAL_COMMUNITY)
Admission: AD | Admit: 2017-01-18 | Discharge: 2017-01-18 | Disposition: A | Discharge status: Home / Self Care | Payer: Medicaid Other | Source: Ambulatory Visit | Attending: Obstetrics and Gynecology | Admitting: Obstetrics and Gynecology

## 2017-01-18 ENCOUNTER — Encounter (HOSPITAL_COMMUNITY): Payer: Self-pay | Admitting: *Deleted

## 2017-01-18 DIAGNOSIS — R1031 Right lower quadrant pain: Secondary | ICD-10-CM

## 2017-01-18 DIAGNOSIS — R1032 Left lower quadrant pain: Secondary | ICD-10-CM

## 2017-01-18 DIAGNOSIS — Z87891 Personal history of nicotine dependence: Secondary | ICD-10-CM | POA: Insufficient documentation

## 2017-01-18 DIAGNOSIS — B9689 Other specified bacterial agents as the cause of diseases classified elsewhere: Secondary | ICD-10-CM

## 2017-01-18 DIAGNOSIS — N760 Acute vaginitis: Secondary | ICD-10-CM | POA: Insufficient documentation

## 2017-01-18 DIAGNOSIS — Z3202 Encounter for pregnancy test, result negative: Secondary | ICD-10-CM | POA: Insufficient documentation

## 2017-01-18 LAB — WET PREP, GENITAL
Sperm: NONE SEEN
Trich, Wet Prep: NONE SEEN
Yeast Wet Prep HPF POC: NONE SEEN

## 2017-01-18 LAB — URINALYSIS, ROUTINE W REFLEX MICROSCOPIC
BILIRUBIN URINE: NEGATIVE
Glucose, UA: NEGATIVE mg/dL
HGB URINE DIPSTICK: NEGATIVE
Ketones, ur: NEGATIVE mg/dL
LEUKOCYTES UA: NEGATIVE
Nitrite: NEGATIVE
PROTEIN: NEGATIVE mg/dL
SPECIFIC GRAVITY, URINE: 1.023 (ref 1.005–1.030)
WBC, UA: NONE SEEN WBC/hpf (ref 0–5)
pH: 8 (ref 5.0–8.0)

## 2017-01-18 LAB — HCG, SERUM, QUALITATIVE: PREG SERUM: NEGATIVE

## 2017-01-18 LAB — POCT PREGNANCY, URINE: PREG TEST UR: NEGATIVE

## 2017-01-18 MED ORDER — METRONIDAZOLE 500 MG PO TABS: 500.0000 mg | ORAL_TABLET | Freq: Two times a day (BID) | ORAL | 0 refills | Status: DC

## 2017-01-18 NOTE — MAU Note (Signed)
Pt had a miscarriage on June 4th. C/o having a discharge possible BV and took a pregnancy test and it was positive. Had a period on July 9th.

## 2017-01-18 NOTE — MAU Provider Note (Signed)
History   Chief Complaint:  Possible Pregnancy and Vaginal Discharge  Sabrina Rasmussen is  26 y.o. G2P1011 No LMP recorded.  Her pregnancy status is unknown. She presents complaining of Possible Pregnancy and Vaginal Discharge  Patient began having thick, white vaginal discharge about 1 week ago, progressively worsening. She says the discharge has a fishy odor. History of BV, most recently in February and presented similarly to today. Also noting increase in urinary frequency, no dysuria or urgency. She had GC/Chlamydia 5 years ago. She is sexually active with one partner for one year with no known STI history. She does not use contraception.  Patient also complaining of lower abdominal cramping. She says it feels like menstrual cramps. Having nausea but no vomiting, no breast tenderness. She had a miscarriage 12/04/16. She says she had a light period that started on 01/08/17 and lasted three days with brown discharge. She had two positive pregnancy tests at home yesterday night and this morning.   OB History    Gravida Para Term Preterm AB Living   2 1 1   1 1    SAB TAB Ectopic Multiple Live Births   1       1       Past Medical History:  Diagnosis Date  . Anxiety   . Panic attack   . Pregnant state, incidental     Past Surgical History:  Procedure Laterality Date  . ABSCESS DRAINAGE    . CESAREAN SECTION      History reviewed. No pertinent family history.  Social History  Substance Use Topics  . Smoking status: Former Smoker    Packs/day: 0.50    Types: Cigarettes  . Smokeless tobacco: Never Used  . Alcohol use No    Allergies: No Known Allergies  Prescriptions Prior to Admission  Medication Sig Dispense Refill Last Dose  . ibuprofen (ADVIL,MOTRIN) 600 MG tablet Take 1 tablet (600 mg total) by mouth every 6 (six) hours as needed. 30 tablet 0   . oxyCODONE-acetaminophen (PERCOCET/ROXICET) 5-325 MG tablet Take 2 tablets by mouth every 4 (four) hours as needed for  severe pain. 6 tablet 0 12/04/2016 at 1230  . prenatal vitamin w/FE, FA (PRENATAL 1 + 1) 27-1 MG TABS tablet Take 1 tablet by mouth daily at 12 noon.   12/03/2016 at Unknown time    Review of Systems - History obtained from the patient General ROS: negative for - chills, fatigue, fever, malaise or night sweats Breast ROS: negative for - breast tenderness or enlargement Respiratory ROS: no cough, shortness of breath, or wheezing Cardiovascular ROS: no chest pain or dyspnea on exertion Gastrointestinal ROS: no abdominal pain, change in bowel habits, or black or bloody stools Genito-Urinary ROS: positive for - change in menstrual cycle, pelvic pain and vaginal discharge per HPI negative for - dysmenorrhea, dyspareunia, dysuria or urinary frequency/urgency  Physical Exam   Blood pressure 126/78, pulse 78, temperature 98.4 F (36.9 C), temperature source Oral, resp. rate 18, height 5\' 3"  (1.6 m), weight 52.5 kg (115 lb 12.8 oz), unknown if currently breastfeeding.  General: General appearance - alert, well appearing, and in no distress, oriented to person, place, and time and well hydrated Mental status - alert, oriented to person, place, and time, normal mood, behavior, speech, dress, motor activity, and thought processes Chest - clear to auscultation, no wheezes, rales or rhonchi, symmetric air entry, no tachypnea, retractions or cyanosis Heart - normal rate, regular rhythm, normal S1, S2, no murmurs, rubs, clicks or  gallops Abdomen - soft, nontender, nondistended, no masses or organomegaly no rebound tenderness noted no CVA tenderness  Focused Gynecological Exam:  Pelvic - VULVA: normal appearing vulva with no masses, tenderness or lesions, VAGINA: normal appearing vagina with normal color and discharge, no lesions, vaginal discharge - thin white, CERVIX: normal appearing cervix without discharge or lesions, cervical motion tenderness absent, UTERUS: uterus is normal size, shape, consistency and  nontender, mobile, ADNEXA: normal adnexa in size, nontender and no masses, WET MOUNT done - results: clue cells, exam chaperoned by Donette Larry, CNM  Labs: Results for orders placed or performed during the hospital encounter of 01/18/17 (from the past 24 hour(s))  Urinalysis, Routine w reflex microscopic   Collection Time: 01/18/17  6:40 PM  Result Value Ref Range   Color, Urine YELLOW YELLOW   APPearance HAZY (A) CLEAR   Specific Gravity, Urine 1.023 1.005 - 1.030   pH 8.0 5.0 - 8.0   Glucose, UA NEGATIVE NEGATIVE mg/dL   Hgb urine dipstick NEGATIVE NEGATIVE   Bilirubin Urine NEGATIVE NEGATIVE   Ketones, ur NEGATIVE NEGATIVE mg/dL   Protein, ur NEGATIVE NEGATIVE mg/dL   Nitrite NEGATIVE NEGATIVE   Leukocytes, UA NEGATIVE NEGATIVE   RBC / HPF 0-5 0 - 5 RBC/hpf   WBC, UA NONE SEEN 0 - 5 WBC/hpf   Bacteria, UA RARE (A) NONE SEEN   Squamous Epithelial / LPF 0-5 (A) NONE SEEN   Mucous PRESENT    Amorphous Crystal PRESENT   Pregnancy, urine POC   Collection Time: 01/18/17  6:46 PM  Result Value Ref Range   Preg Test, Ur NEGATIVE NEGATIVE  hCG, serum, qualitative   Collection Time: 01/18/17  7:10 PM  Result Value Ref Range   Preg, Serum NEGATIVE NEGATIVE  Wet prep, genital   Collection Time: 01/18/17  7:22 PM  Result Value Ref Range   Yeast Wet Prep HPF POC NONE SEEN NONE SEEN   Trich, Wet Prep NONE SEEN NONE SEEN   Clue Cells Wet Prep HPF POC PRESENT (A) NONE SEEN   WBC, Wet Prep HPF POC FEW (A) NONE SEEN   Sperm NONE SEEN    MDM: No evidence of pregnancy. No evidence of acute abdominal or pelvic process. Cramping likely caused by BV. Will treat with Flagyl.   Assessment and Plan  Bacterial vaginosis -Metronidazole 500 mg BID x1 week  Negative pregnancy test -Negative UPT, negative beta hcg qualitative -Reassured patient that she is not pregnant and that menstrual cycle may some to resume back to normal after miscarriage. Home pregnancy tests likely false  negative -Encouraged use of prenatal vitamin, as patient is sexually active and not using contraception. -Ibuprofen 600 mg q6h PRN okay to use for cramping   Discharge Medications: Allergies as of 01/18/2017   No Known Allergies     Medication List    STOP taking these medications   oxyCODONE-acetaminophen 5-325 MG tablet Commonly known as:  PERCOCET/ROXICET     TAKE these medications   ibuprofen 600 MG tablet Commonly known as:  ADVIL,MOTRIN Take 1 tablet (600 mg total) by mouth every 6 (six) hours as needed.   metroNIDAZOLE 500 MG tablet Commonly known as:  FLAGYL Take 1 tablet (500 mg total) by mouth 2 (two) times daily.   prenatal vitamin w/FE, FA 27-1 MG Tabs tablet Take 1 tablet by mouth daily at 12 noon.       Jeanie Cooks, Medical Student 01/18/2017, 7:54 PM  I confirm that I have verified the information  documented in the student's note and that I have also personally reperformed the physical exam and all medical decision making activities.  Donette LarryBhambri, Tanikka Bresnan, CNM  01/18/2017 8:21 PM

## 2017-01-18 NOTE — Discharge Instructions (Signed)

## 2017-01-19 LAB — GC/CHLAMYDIA PROBE AMP (~~LOC~~) NOT AT ARMC
CHLAMYDIA, DNA PROBE: NEGATIVE
Neisseria Gonorrhea: NEGATIVE

## 2017-02-05 ENCOUNTER — Emergency Department (HOSPITAL_COMMUNITY): Payer: Medicaid Other

## 2017-02-05 ENCOUNTER — Encounter (HOSPITAL_COMMUNITY): Payer: Self-pay

## 2017-02-05 ENCOUNTER — Emergency Department (HOSPITAL_COMMUNITY)
Admission: EM | Admit: 2017-02-05 | Discharge: 2017-02-05 | Disposition: A | Discharge status: Home / Self Care | Payer: Medicaid Other | Attending: Emergency Medicine | Admitting: Emergency Medicine

## 2017-02-05 ENCOUNTER — Ambulatory Visit (HOSPITAL_COMMUNITY)
Admission: EM | Admit: 2017-02-05 | Discharge: 2017-02-05 | Disposition: A | Discharge status: Nursing Home (Includes ICF) | Payer: Medicaid Other | Attending: Internal Medicine | Admitting: Internal Medicine

## 2017-02-05 ENCOUNTER — Encounter (HOSPITAL_COMMUNITY): Payer: Self-pay | Admitting: Emergency Medicine

## 2017-02-05 DIAGNOSIS — Y9389 Activity, other specified: Secondary | ICD-10-CM | POA: Insufficient documentation

## 2017-02-05 DIAGNOSIS — R0789 Other chest pain: Secondary | ICD-10-CM | POA: Insufficient documentation

## 2017-02-05 DIAGNOSIS — Z87891 Personal history of nicotine dependence: Secondary | ICD-10-CM | POA: Insufficient documentation

## 2017-02-05 DIAGNOSIS — W228XXA Striking against or struck by other objects, initial encounter: Secondary | ICD-10-CM | POA: Insufficient documentation

## 2017-02-05 DIAGNOSIS — Z3202 Encounter for pregnancy test, result negative: Secondary | ICD-10-CM

## 2017-02-05 DIAGNOSIS — R1084 Generalized abdominal pain: Secondary | ICD-10-CM

## 2017-02-05 DIAGNOSIS — Y990 Civilian activity done for income or pay: Secondary | ICD-10-CM | POA: Insufficient documentation

## 2017-02-05 LAB — POCT URINALYSIS DIP (DEVICE)
Bilirubin Urine: NEGATIVE
GLUCOSE, UA: NEGATIVE mg/dL
Hgb urine dipstick: NEGATIVE
KETONES UR: NEGATIVE mg/dL
LEUKOCYTES UA: NEGATIVE
Nitrite: NEGATIVE
PROTEIN: NEGATIVE mg/dL
Urobilinogen, UA: 0.2 mg/dL (ref 0.0–1.0)
pH: 6 (ref 5.0–8.0)

## 2017-02-05 LAB — POCT PREGNANCY, URINE: PREG TEST UR: NEGATIVE

## 2017-02-05 MED ORDER — IBUPROFEN 600 MG PO TABS: 600.0000 mg | ORAL_TABLET | Freq: Four times a day (QID) | ORAL | 0 refills | Status: DC | PRN

## 2017-02-05 MED ORDER — KETOROLAC TROMETHAMINE 60 MG/2ML IM SOLN
60.0000 mg | Freq: Once | INTRAMUSCULAR | Status: DC
  Filled 2017-02-05: qty 2

## 2017-02-05 NOTE — ED Notes (Addendum)
Pt refuses pain medication states she is not able to tolerate needles.  Medication wasted.

## 2017-02-05 NOTE — Discharge Instructions (Signed)
°  Antiinflammatory medications: Take 600 mg of ibuprofen every 6 hours or 440 mg (over the counter dose) to 500 mg (prescription dose) of naproxen every 12 hours or for the next 3 days. After this time, these medications may be used as needed for pain. Take these medications with food to avoid upset stomach. Choose only one of these medications, do not take them together.  Tylenol: Should you continue to have additional pain while taking the ibuprofen or naproxen, you may add in tylenol as needed. Your daily total maximum amount of tylenol from all sources should be limited to 4000mg /day for persons without liver problems, or 2000mg /day for those with liver problems. Lidocaine patches: These are available via either prescription or over-the-counter. The over-the-counter option may be more economical one and are likely just as effective. There are multiple over-the-counter brands, such as Salonpas.  Follow up with a primary care provider for any future management of these complaints.  You can have continued pain in this area for 6-12 weeks from the injury.   Unrelated to your complaint today, you should probably follow-up with OB/GYN to assure all is well following the miscarriage you reported.

## 2017-02-05 NOTE — Discharge Instructions (Signed)
Based on your signs and symptoms I believe you need to go to the ER for further evlaution

## 2017-02-05 NOTE — ED Provider Notes (Signed)
  [facility]   [id number] 02/05/17 Arrival Time: 1415  ASSESSMENT & PLAN:  1. Generalized abdominal pain     Meds ordered this encounter  Medications  . oxyCODONE-acetaminophen (PERCOCET/ROXICET) 5-325 MG tablet    Sig: Take by mouth every 4 (four) hours as needed for severe pain.   Due to presence of peritoneal signs were recommend further evaluation in the ER Reviewed expectations re: course of current medical issues. Questions answered. Outlined signs and symptoms indicating need for more acute intervention. Patient verbalized understanding. After Visit Summary given.   SUBJECTIVE:  Grace BlightDenekia D Debrosse is a 26 y.o. female who presents with complaint of Worsening abdominal pain ongoing for months. Pain is consistent with a miscarriage she had in June. She denies any vaginal discharge, but has had what she describes as some slight spotting continuously since. Denies any fever or chills, or dysuria. She reports pain 10 out of 10, unrelieved with Percocets.  ROS: As per HPI, remainder of ROS negative.   OBJECTIVE:  Vitals:   02/05/17 1541  BP: 111/81  Pulse: 92  Resp: 20  Temp: 98.6 F (37 C)  TempSrc: Oral  SpO2: 98%     General appearance: alert; no distress HEENT: normocephalic; atraumatic; conjunctivae normal; Lungs: clear to auscultation bilaterally Heart: regular rate and rhythm Abdomen: Guarding noted, and pain in the abdominal region with straight leg raise, bowel sounds are normal, there is no rebound tenderness. Back: no CVA tenderness Extremities: no cyanosis or edema; symmetrical with no gross deformities Skin: warm and dry Neurologic: Grossly normal Psychological:  alert and cooperative; normal mood and affect  Procedures:     Results for orders placed or performed during the hospital encounter of 02/05/17  POCT urinalysis dip (device)  Result Value Ref Range   Glucose, UA NEGATIVE NEGATIVE mg/dL   Bilirubin Urine NEGATIVE NEGATIVE   Ketones, ur NEGATIVE NEGATIVE mg/dL   Specific Gravity, Urine >=1.030 1.005 - 1.030   Hgb urine dipstick NEGATIVE NEGATIVE   pH 6.0 5.0 - 8.0   Protein, ur NEGATIVE NEGATIVE mg/dL   Urobilinogen, UA 0.2 0.0 - 1.0 mg/dL   Nitrite NEGATIVE NEGATIVE   Leukocytes, UA NEGATIVE NEGATIVE  Pregnancy, urine POC  Result Value Ref Range   Preg Test, Ur NEGATIVE NEGATIVE    Labs Reviewed  POCT URINALYSIS DIP (DEVICE)  POCT PREGNANCY, URINE    No results found.  No Known Allergies  PMHx, SurgHx, SocialHx, Medications, and Allergies were reviewed in the Visit Navigator and updated as appropriate.       Dorena BodoKennard, Philemon Riedesel, NP 02/05/17 [phone]

## 2017-02-05 NOTE — ED Triage Notes (Signed)
[facility] pain x 2 weeks. Pt reports pain when taking breaths and talking. Denies injury

## 2017-02-05 NOTE — ED Triage Notes (Addendum)
Concerns for sharp abdominal pain for a month.  Reports recent miscarriage.  Minimal bleeding, denies a period since miscarriage June 4

## 2017-02-05 NOTE — ED Provider Notes (Signed)
[facility] Provider Note   CSN: [id number] Arrival date & time: 02/05/17  1621  By signing my name below, I, Diona BrownerJennifer Gorman, attest that this documentation has been prepared under the direction and in the presence of Jereme Loren, PA-C. Electronically Signed: Diona BrownerJennifer Gorman, ED Scribe. 02/05/17. 9:05 PM.  History   Chief Complaint Chief Complaint  Patient presents with  . Muscle Pain    left rib pain    HPI Sabrina Rasmussen is a 26 y.o. female with a PMHx of chronic back pain, who presents to the Emergency Department With a complaint of left rib pain for the past 2 weeks. 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 Pain is sharp, constant, 6-7/10, nonradiating. Worse with deep breathing, movement, and palpation. Patient was seen at urgent care prior to arrival in the ED and was sent here for further evaluation. She has taken Tylenol with some relief. She includes that she had a miscarriage on June 4. Denies fever/chills, shortness of breath, cough, N/V/C/D, abnormal vaginal bleeding/discharge, urinary complaints, or any other complaints.   The history is provided by the patient. No language interpreter was used.    Past Medical History:  Diagnosis Date  . Anxiety   . Panic attack   . Pregnant state, incidental     There are no active problems to display for this patient.   Past Surgical History:  Procedure Laterality Date  . ABSCESS DRAINAGE    . CESAREAN SECTION      OB History    Gravida Para Term Preterm AB Living   2 1 1   1 1    SAB TAB Ectopic Multiple Live Births   1       1       Home Medications    Prior to Admission medications   Medication Sig Start Date End Date Taking? Authorizing Provider  ibuprofen (ADVIL,MOTRIN) 600 MG tablet Take 1 tablet (600 mg total) by mouth every 6 (six) hours as needed. 02/05/17   Nicolena Schurman C, PA-C  oxyCODONE-acetaminophen (PERCOCET/ROXICET) 5-325 MG tablet Take by  mouth every 4 (four) hours as needed for severe pain.    [provider]    Family History No family history on file.  Social History Social History  Substance Use Topics  . Smoking status: Former Smoker    Packs/day: 0.50    Types: Cigarettes  . Smokeless tobacco: Never Used  . Alcohol use No     Allergies   Patient has no known allergies.   Review of Systems Review of Systems  Constitutional: Negative for chills and fever.  Respiratory: Negative for cough and shortness of breath.   Gastrointestinal: Negative for abdominal pain.  Genitourinary: Negative for dysuria, hematuria and pelvic pain.  Musculoskeletal:       Left rib pain  Neurological: Negative for weakness.  All other systems reviewed and are negative.    Physical Exam Updated Vital Signs BP 108/67 (BP Location: Right Arm)   Pulse 61   Temp 98.3 F (36.8 C) (Oral)   Resp 19   LMP 01/08/2017   SpO2 99%   Physical Exam  Constitutional: She appears well-developed and well-nourished. No distress.  HENT:  Head: Normocephalic and atraumatic.  Eyes: Conjunctivae are normal.  Neck: Neck supple.  Cardiovascular: Normal rate, regular rhythm, normal heart sounds and intact distal pulses.   Pulmonary/Chest: Effort normal and breath sounds normal. No respiratory distress.  Abdominal: Soft. She exhibits no distension. There is no tenderness.  No noted abdominal tenderness, distension, erythema, or bruising.   Musculoskeletal: She exhibits tenderness. She exhibits no edema.  Tenderness to the left anterior and lateral ribs 8-10. No crepitus, instability, ecchymosis, erythema or swelling. Noted to be splinting area with her arm and she states this improves her pain.   Lymphadenopathy:    She has no cervical adenopathy.  Neurological: She is alert.  Skin: Skin is warm and dry. She is not diaphoretic.  Psychiatric: She has a normal mood and affect. Her behavior is normal.  Nursing note and vitals  reviewed.    ED Treatments / Results  Labs (all labs ordered are listed, but only abnormal results are displayed) Labs Reviewed - No data to display  EKG  EKG Interpretation None       Radiology Dg Chest 2 View  Result Date: 02/05/2017 CLINICAL DATA:  Left-sided rib pain and pleuritic chest pain. EXAM: CHEST  2 VIEW COMPARISON:  07/21/2016 FINDINGS: The heart size and mediastinal contours are within normal limits. There is no evidence of pulmonary edema, consolidation, pneumothorax, nodule or pleural fluid. The visualized skeletal structures are unremarkable. IMPRESSION: No active cardiopulmonary disease. Electronically Signed   By: Irish Lack M.D.   On: 02/05/2017 18:36    Procedures Procedures (including critical care time)  Medications Ordered in ED Medications  ketorolac (TORADOL) injection 60 mg (60 mg Intramuscular Not Given 02/05/17 2141)     Initial Impression / Assessment and Plan / ED Course  I have reviewed the triage vital signs and the nursing notes.  Pertinent labs & imaging results that were available during my care of the patient were reviewed by me and considered in my medical decision making (see chart for details).     Patient presents with left rib pain for the last 2 weeks following a traumatic incident. Patient is nontoxic appearing, afebrile, not tachycardic, not tachypneic, not hypotensive, and SPO2 of 99% on room air. No acute abnormalities on chest x-ray. I do not think patient's complaint today is connected with the patient's miscarriage, rather it seems to be connected with the direct trauma to that area. The patient was given more realistic expectations for the length of time she could expect pain connected with a rib injury. She is advised to secure care from a PCP for any further management of this issue. Resources given. The patient was given instructions for home care as well as return precautions. Patient voices understanding of these  instructions.   Patient has not sought out OB/GYN follow-up following her miscarriage. She states that she simply had an episode of vaginal bleeding and cramping and then serial home pregnancy tests eventually turned negative. She does not report symptoms that would lead me to believe that she needs an emergent ultrasound or other emergent imaging. However, she should definitely follow up with OB/GYN. Resources were given for this as well.       Final Clinical Impressions(s) / ED Diagnoses   Final diagnoses:  Chest wall pain    New Prescriptions Discharge Medication List as of 02/05/2017  9:31 PM     I personally performed the services described in this documentation, which was scribed in my presence. The recorded information has been reviewed and is accurate.    Anselm Pancoast, PA-C 02/06/17 0033    Tilden Fossa, MD 02/06/17 1440

## 2017-02-05 NOTE — ED Notes (Signed)
Pt refusing to sign dc instructions until talk back with the PA. Husband not satisfied with care states she is not feeling well to go home. PA notified.

## 2017-02-05 NOTE — ED Notes (Signed)
Pt states she is feeling better and she doesn't want to wait no longer. Pt dc, instructions given.

## 2017-02-13 ENCOUNTER — Emergency Department (HOSPITAL_COMMUNITY)
Admission: EM | Admit: 2017-02-13 | Discharge: 2017-02-13 | Disposition: A | Discharge status: Home / Self Care | Payer: Self-pay | Attending: Physician Assistant | Admitting: Physician Assistant

## 2017-02-13 ENCOUNTER — Emergency Department (HOSPITAL_COMMUNITY): Payer: Self-pay

## 2017-02-13 ENCOUNTER — Encounter (HOSPITAL_COMMUNITY): Payer: Self-pay

## 2017-02-13 DIAGNOSIS — F419 Anxiety disorder, unspecified: Secondary | ICD-10-CM | POA: Insufficient documentation

## 2017-02-13 DIAGNOSIS — Z87891 Personal history of nicotine dependence: Secondary | ICD-10-CM | POA: Insufficient documentation

## 2017-02-13 DIAGNOSIS — R079 Chest pain, unspecified: Secondary | ICD-10-CM | POA: Insufficient documentation

## 2017-02-13 LAB — CBC WITH DIFFERENTIAL/PLATELET
BASOS PCT: 1 %
Basophils Absolute: 0 10*3/uL (ref 0.0–0.1)
EOS ABS: 0.1 10*3/uL (ref 0.0–0.7)
Eosinophils Relative: 2 %
HCT: 38.9 % (ref 36.0–46.0)
Hemoglobin: 13.4 g/dL (ref 12.0–15.0)
Lymphocytes Relative: 33 %
Lymphs Abs: 1.1 10*3/uL (ref 0.7–4.0)
MCH: 32 pg (ref 26.0–34.0)
MCHC: 34.4 g/dL (ref 30.0–36.0)
MCV: 92.8 fL (ref 78.0–100.0)
MONO ABS: 0.2 10*3/uL (ref 0.1–1.0)
MONOS PCT: 6 %
Neutro Abs: 1.9 10*3/uL (ref 1.7–7.7)
Neutrophils Relative %: 58 %
PLATELETS: 249 10*3/uL (ref 150–400)
RBC: 4.19 MIL/uL (ref 3.87–5.11)
RDW: 13.9 % (ref 11.5–15.5)
WBC: 3.3 10*3/uL — ABNORMAL LOW (ref 4.0–10.5)

## 2017-02-13 LAB — I-STAT TROPONIN, ED: TROPONIN I, POC: 0.01 ng/mL (ref 0.00–0.08)

## 2017-02-13 NOTE — ED Notes (Signed)
Pt's named called x2. No response.  

## 2017-02-13 NOTE — ED Triage Notes (Signed)
Per Pt, [facility]. Pt was sent home with medication and work note that stated no heavy lifting. Pt reports that her work will not allow her to work under these restrictions. She would like to be examined again, and requesting to have restriction removed for work.

## 2017-02-13 NOTE — ED Provider Notes (Signed)
[facility] Provider Note   CSN: [id number] Arrival date & time: 02/13/17  [id number]  By signing my name below, I, Rosario AdieWilliam Andrew Hiatt, attest that this documentation has been prepared under the direction and in the presence of Graciella FreerLindsey Robb Sibal, PA-C.  Electronically Signed: Rosario AdieWilliam Andrew Hiatt, ED Scribe. 02/13/17. 9:55 AM.  History   Chief Complaint Chief Complaint  Patient presents with  . Chest Pain   The history is provided by the patient. No language interpreter was used.    HPI Comments: Sabrina Rasmussen is a 26 y.o. female with a h/o anxiety, who presents to the Emergency Department complaining of intermittent episodes of mild left lower ribcage pain beginning two weeks ago. 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 [facility] and [facility] in the ED on 02/05/17 for her pain; at that time she was dx'd w/ chest wall pain. CXR at that time was negative. She reports that she has been taking Ibuprofen at home with improvement of her pain and she notes she now only has mild, intermittent, sharp pain to the area. Her pain is worse with deep inspirations. Pt also states that she attempted to return to work; however, her work note placed her on work restrictions and she was sent home at that time d/t her restrictions and FMLA paperwork. She states that she would now like this to be changed to no restrictions and has to have this filled out prior to returning to work. Of note, pt did recently have a miscarriage on 12/04/16. No h/o PE/DVT, recent long travel, surgery, fracture, prolonged immobilization, or exogenous estrogen usage. She denies leg swelling, shortness of breath, cough, fever, or any other associated symptoms.   Past Medical History:  Diagnosis Date  . Anxiety   . Panic attack   . Pregnant state, incidental    There are no active problems to display for this  patient.  Past Surgical History:  Procedure Laterality Date  . ABSCESS DRAINAGE    . CESAREAN SECTION     OB History    Gravida Para Term Preterm AB Living   2 1 1   1 1    SAB TAB Ectopic Multiple Live Births   1       1     Home Medications    Prior to Admission medications   Medication Sig Start Date End Date Taking? Authorizing Provider  ibuprofen (ADVIL,MOTRIN) 600 MG tablet Take 1 tablet (600 mg total) by mouth every 6 (six) hours as needed. 02/05/17   Joy, Shawn C, PA-C  oxyCODONE-acetaminophen (PERCOCET/ROXICET) 5-325 MG tablet Take by mouth every 4 (four) hours as needed for severe pain.    [provider]   Family History No family history on file.  Social History Social History  Substance Use Topics  . Smoking status: Former Smoker    Packs/day: 0.50    Types: Cigarettes  . Smokeless tobacco: Never Used  . Alcohol use No   Allergies   Patient has no known allergies.  Review of Systems Review of Systems  Constitutional: Negative for fever.  Respiratory: Negative for cough and shortness of breath.   Cardiovascular: Positive for chest pain (left lower ribcage). Negative for leg swelling.  Gastrointestinal: Negative for abdominal pain, nausea and vomiting.  Genitourinary: Negative for dysuria and hematuria.  Neurological: Negative for headaches.   Physical Exam Updated  Vital Signs BP 126/80 (BP Location: Right Arm)   Pulse 100   Temp 98.2 F (36.8 C) (Oral)   Resp 16   Ht 5\' 3"  (1.6 m)   Wt 115 lb (52.2 kg)   SpO2 99%   BMI 20.37 kg/m   Physical Exam  Constitutional: She appears well-developed and well-nourished.  Sitting comfortably on examination table  HENT:  Head: Normocephalic and atraumatic.  Eyes: Conjunctivae and EOM are normal. Right eye exhibits no discharge. Left eye exhibits no discharge. No scleral icterus.  Cardiovascular: Normal rate, regular rhythm and normal heart sounds.   No murmur heard. Pulmonary/Chest: Effort normal  and breath sounds normal. No respiratory distress. She has no wheezes. She has no rales. She exhibits tenderness.  No evidence of respiratory distress. Able to speak in full sentences without difficulty. TTP over the left lateral chest wall. No deformities or crepitus. No overlying ecchymosis.   Musculoskeletal:  No bilateral lower extremity edema  Neurological: She is alert.  Skin: Skin is warm and dry.  Psychiatric: She has a normal mood and affect. Her speech is normal and behavior is normal.  Nursing note and vitals reviewed.  ED Treatments / Results  DIAGNOSTIC STUDIES: Oxygen Saturation is 99% on RA, normal by my interpretation.   COORDINATION OF CARE: 9:53 AM-Discussed next steps with pt. Pt verbalized understanding and is agreeable with the plan.   Labs (all labs ordered are listed, but only abnormal results are displayed) Labs Reviewed - No data to display  EKG  EKG Interpretation None      Radiology No results found.  Procedures Procedures   Medications Ordered in ED Medications - No data to display  Initial Impression / Assessment and Plan / ED Course  I have reviewed the triage vital signs and the nursing notes.  Pertinent labs & imaging results that were available during my care of the patient were reviewed by me and considered in my medical decision making (see chart for details).     26 year old female who presents ith persistent chest pain. Chest pain occurred approximately 2 weeks ago after she was hit in the left anterior chest by a load of carts. Patient is initially send evaluated in the emergency department on 02/06/17. At that time her chest x-ray was negative for any acute rib frre. Patient was discharged home with symptomatic relief and told to follow up with pimary care. Patient returns today because she states that her work no says that she should not have any heavy lifting in her work will not let her work underneath restrictions. Patient states  that she needs FMLA papers filled out. Patient states that she has had persistent pain. No new changes or worsening of pain but states that it has persisted. Patient is afebrile, non-toxic appearing, sitting comfortably on examination table. Vital signs reviewed and stable. Suspicion for ACS etiology. Patient is perk negative and is low risk for PE, she does not require any further workup.Procedure reviewed from previous visit on 857/18. Will plan to do additional labs including CBC, troponin, EKG, chest x-ray.  Labs and imaging reviewed. CBC shows a white blood cell count. Records reviewed the patient has a history of low. No signs of anemia. Troponin is negative. Chest x-rays negative for any acute fracture or acute infectious etiology. Discussed results with patient. Stated that patient needed a continue with medications provided to help with symptomatic relief. Conservative therapies discussed. Patient is asking that I saw her family paperwork. Explained to patient that  she'll need primary care evaluation to fill out FMLA  Paperwork. Strict return precautions discussed. Patient expresses understanding and agreement to plan.    Final Clinical Impressions(s) / ED Diagnoses   Final diagnoses:  Chest pain, unspecified type   New Prescriptions New Prescriptions   No medications on file   I personally performed the services described in this documentation, which was scribed in my presence. The recorded information has been reviewed and is accurate.    Maxwell Caul, PA-C 02/16/17 1558    Abelino Derrick, MD 02/21/17 1759

## 2017-02-13 NOTE — Discharge Instructions (Signed)
You can take Tylenol or Ibuprofen as directed for pain. Can alternate Tylenol or ibuprofen every 4 hours. Take Tylenol and wait 4 hours, take ibuprofen. You can continue taking the pain medication they were previously prescribed.  Follow-up with referred clinic for further primary care evaluation.  Return to the emergency department for any worsening pain, difficulty breathing, difficulty swallowing, vomiting or any other worsening or concerning symptoms.

## 2017-02-14 ENCOUNTER — Ambulatory Visit (INDEPENDENT_AMBULATORY_CARE_PROVIDER_SITE_OTHER): Payer: Self-pay | Admitting: Family Medicine

## 2017-02-14 ENCOUNTER — Encounter: Payer: Self-pay | Admitting: Family Medicine

## 2017-02-14 VITALS — BP 117/60 | HR 75 | Temp 98.5°F | Resp 14 | Ht 63.0 in | Wt 116.0 lb

## 2017-02-14 DIAGNOSIS — Z3009 Encounter for other general counseling and advice on contraception: Secondary | ICD-10-CM

## 2017-02-14 DIAGNOSIS — E876 Hypokalemia: Secondary | ICD-10-CM

## 2017-02-14 DIAGNOSIS — F411 Generalized anxiety disorder: Secondary | ICD-10-CM

## 2017-02-14 DIAGNOSIS — R0781 Pleurodynia: Secondary | ICD-10-CM

## 2017-02-14 DIAGNOSIS — R634 Abnormal weight loss: Secondary | ICD-10-CM

## 2017-02-14 LAB — POCT URINALYSIS DIP (DEVICE)
Bilirubin Urine: NEGATIVE
Glucose, UA: NEGATIVE mg/dL
HGB URINE DIPSTICK: NEGATIVE
Ketones, ur: NEGATIVE mg/dL
Leukocytes, UA: NEGATIVE
NITRITE: NEGATIVE
PH: 7 (ref 5.0–8.0)
PROTEIN: 30 mg/dL — AB
Specific Gravity, Urine: 1.025 (ref 1.005–1.030)
Urobilinogen, UA: 1 mg/dL (ref 0.0–1.0)

## 2017-02-14 LAB — TSH: TSH: 0.72 mIU/L

## 2017-02-14 LAB — POCT URINE PREGNANCY: PREG TEST UR: NEGATIVE

## 2017-02-14 MED ORDER — NORETHINDRONE ACET-ETHINYL EST 1-20 MG-MCG PO TABS: 1.0000 | ORAL_TABLET | Freq: Every day | ORAL | 3 refills | Status: DC

## 2017-02-14 MED ORDER — BUSPIRONE HCL 5 MG PO TABS: 5.0000 mg | ORAL_TABLET | Freq: Two times a day (BID) | ORAL | 5 refills | Status: DC

## 2017-02-14 MED ORDER — CYCLOBENZAPRINE HCL 5 MG PO TABS: 5.0000 mg | ORAL_TABLET | Freq: Three times a day (TID) | ORAL | 1 refills | Status: DC | PRN

## 2017-02-14 NOTE — Progress Notes (Signed)
Subjective:    Patient ID: Sabrina Rasmussen, female    DOB: May 30, 1991, 26 y.o.   MRN: [id number]  HPI Sabrina Rasmussen, a 26 year old female presents to establish care. Patient has been primarily utilizing the emergency department for all primary care needs. Patient is complaining of pain to left rib cage. She sustained an injury at work on 02/05/2017 after being hit with a cart. 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 Current pain intensity is 3/10 and she describes it as aching with occasional spasms.  Olita is also complaining of depression. She says that depression worsened after a miscarriage on 12/04/2016.  She complains of anhedonia, depressed mood, hopelessness and insomnia. She says that her family does not understand what it is like to have a miscarriage. She has not sought counseling. She denies current suicidal and homicidal plan or intent.      Patient also presents for contraception counseling. The patient is sexually active and does not use barrier protection. She has been on depo provera in the past. She is requesting oral contraception today. She is a chronic everyday smoker. She denieshypertension, liver disease, migraines, pelvic inflammatory disease, sexually transmitted diseases, thromboembolism and urinary tract infections.  Past Medical History:  Diagnosis Date  . Anxiety   . Panic attack   . Pregnant state, incidental    Social History   Social History  . Marital status: Single    Spouse name: N/A  . Number of children: N/A  . Years of education: N/A   Occupational History  . Not on file.   Social History Main Topics  . Smoking status: Current Some Day Smoker    Packs/day: 0.50    Types: Cigarettes  . Smokeless tobacco: Never Used  . Alcohol use No  . Drug use: Yes    Types: Marijuana  . Sexual activity: Yes    Birth control/ protection: None   Other Topics Concern  . Not on file    Social History Narrative  . No narrative on file   Review of Systems  Constitutional: Negative.   HENT: Negative.   Eyes: Negative.   Respiratory: Negative.   Cardiovascular: Negative.   Gastrointestinal: Negative.   Endocrine: Negative.   Genitourinary: Negative.   Musculoskeletal: Negative.        Left rib pain  Allergic/Immunologic: Negative.   Neurological: Negative.  Negative for weakness.  Hematological: Negative.   Psychiatric/Behavioral: Negative.        Depression       Objective:   Physical Exam  Constitutional: She is oriented to person, place, and time. She appears well-developed and well-nourished.  HENT:  Head: Normocephalic and atraumatic.  Right Ear: External ear normal.  Left Ear: External ear normal.  Nose: Nose normal.  Mouth/Throat: Oropharynx is clear and moist.  Eyes: Pupils are equal, round, and reactive to light. Conjunctivae and EOM are normal.  Neck: Normal range of motion. Neck supple.  Cardiovascular: Normal rate, regular rhythm, normal heart sounds and intact distal pulses.   Pulmonary/Chest: Effort normal and breath sounds normal.  Abdominal: Soft. Bowel sounds are normal.  Neurological: She is alert and oriented to person, place, and time. She has normal reflexes.  Skin: Skin is warm and dry.  Psychiatric: She has a normal mood and affect. Her behavior is normal. Judgment and thought content normal.     BP 117/60 (BP Location: Left Arm, Patient Position: Sitting,  Cuff Size: Normal)   Pulse 75   Temp 98.5 F (36.9 C) (Oral)   Resp 14   Ht 5\' 3"  (1.6 m)   Wt 116 lb (52.6 kg)   SpO2 100%   BMI 20.55 kg/m  Assessment & Plan:  1. Rib pain on left side Recommend muscle relaxer  for occassional spasms. Also recommend warm compresses to left chest as needed. Patient can return to  Work without restrictions.  - cyclobenzaprine (FLEXERIL) 5 MG tablet; Take 1 tablet (5 mg total) by mouth 3 (three) times daily as needed for muscle spasms.   Dispense: 30 tablet; Refill: 1  2. General counseling for prescription of oral contraceptives Urine pregnancy negative.  Will start oral contraception.  Written educational materials given - POCT urine pregnancy - norethindrone-ethinyl estradiol (LOESTRIN 1/20, 21,) 1-20 MG-MCG tablet; Take 1 tablet by mouth daily.  Dispense: 1 Package; Refill: 3  3. Generalized anxiety disorder Depression screen [facility] 2/9 02/14/2017  Decreased Interest 3  Down, Depressed, Hopeless 3  PHQ - 2 Score 6  Altered sleeping 3  Tired, decreased energy 3  Change in appetite 3  Feeling bad or failure about yourself  3  Trouble concentrating 1  Moving slowly or fidgety/restless 3  Suicidal thoughts 2  PHQ-9 Score 24  Difficult doing work/chores Extremely dIfficult   Will start a trial of Buspar 5 mg twice daily. Will follow up in 1 month.  - Ambulatory referral to Psychiatry - busPIRone (BUSPAR) 5 MG tablet; Take 1 tablet (5 mg total) by mouth 2 (two) times daily.  Dispense: 60 tablet; Refill: 5  4. Weight loss - TSH - POCT urinalysis dip (device)  5. Hypokalemia Previous potassium level was 3.2, will repeat BMP on today - BASIC METABOLIC PANEL WITH GFR    RTC: 1 month for depression The patient was given clear instructions to go to ER or return to medical center if symptoms do not improve, worsen or new problems develop. The patient verbalized understanding. Will notify patient with laboratory results.    Sabrina Nations  MSN, FNP-C [facility] Patient [facility] [address] [place], [place] [address] [phone]

## 2017-02-14 NOTE — Patient Instructions (Addendum)
Patient is scheduled to return to work on Monday without restriction.  Apply warm, moist compresses to left chest wall 4 time per day for 20 minutes, use interchangeably with cold compresses. Continue Ibuprofen 600 mg every 8 hours as needed for mild to moderate pain. Take with food.  Cyclobenzaprine 5 mg every 8 hours as needed. Refrain from drinking alcohol, driving, or operating machinery while taking medication.    Generalized anxiety disorder:   Do no start Buspar prior to 02/19/2017.  Sent referral to psychiatry, will notify you by phone to schedule appointment.

## 2017-02-15 LAB — BASIC METABOLIC PANEL WITH GFR
BUN: 11 mg/dL (ref 7–25)
CALCIUM: 9.3 mg/dL (ref 8.6–10.2)
CO2: 21 mmol/L (ref 20–32)
CREATININE: 0.69 mg/dL (ref 0.50–1.10)
Chloride: 107 mmol/L (ref 98–110)
GFR, Est Non African American: >89 mL/min (ref >=60)
GLUCOSE: 87 mg/dL (ref 65–99)
Potassium: 4 mmol/L (ref 3.5–5.3)
Sodium: 140 mmol/L (ref 135–146)

## 2017-02-16 ENCOUNTER — Ambulatory Visit: Payer: Self-pay | Admitting: Family Medicine

## 2017-02-22 ENCOUNTER — Telehealth: Payer: Self-pay

## 2017-02-22 NOTE — Telephone Encounter (Signed)
Spoke with patient and advised that this was faxed in on 02/14/2017. Thanks!

## 2017-03-16 ENCOUNTER — Emergency Department (HOSPITAL_COMMUNITY)
Admission: EM | Admit: 2017-03-16 | Discharge: 2017-03-17 | Disposition: A | Discharge status: Home / Self Care | Payer: Self-pay | Attending: Emergency Medicine | Admitting: Emergency Medicine

## 2017-03-16 ENCOUNTER — Encounter (HOSPITAL_COMMUNITY): Payer: Self-pay | Admitting: Emergency Medicine

## 2017-03-16 DIAGNOSIS — F411 Generalized anxiety disorder: Secondary | ICD-10-CM | POA: Diagnosis present

## 2017-03-16 DIAGNOSIS — F322 Major depressive disorder, single episode, severe without psychotic features: Secondary | ICD-10-CM | POA: Insufficient documentation

## 2017-03-16 DIAGNOSIS — R4589 Other symptoms and signs involving emotional state: Secondary | ICD-10-CM

## 2017-03-16 DIAGNOSIS — Z79899 Other long term (current) drug therapy: Secondary | ICD-10-CM | POA: Insufficient documentation

## 2017-03-16 DIAGNOSIS — F329 Major depressive disorder, single episode, unspecified: Secondary | ICD-10-CM | POA: Diagnosis present

## 2017-03-16 DIAGNOSIS — F17210 Nicotine dependence, cigarettes, uncomplicated: Secondary | ICD-10-CM | POA: Insufficient documentation

## 2017-03-16 LAB — CBC WITH DIFFERENTIAL/PLATELET
BASOS PCT: 0 %
Basophils Absolute: 0 10*3/uL (ref 0.0–0.1)
Eosinophils Absolute: 0 10*3/uL (ref 0.0–0.7)
Eosinophils Relative: 1 %
HCT: 37.7 % (ref 36.0–46.0)
HEMOGLOBIN: 13.1 g/dL (ref 12.0–15.0)
LYMPHS ABS: 1 10*3/uL (ref 0.7–4.0)
Lymphocytes Relative: 28 %
MCH: 31.4 pg (ref 26.0–34.0)
MCHC: 34.7 g/dL (ref 30.0–36.0)
MCV: 90.4 fL (ref 78.0–100.0)
MONOS PCT: 4 %
Monocytes Absolute: 0.2 10*3/uL (ref 0.1–1.0)
NEUTROS PCT: 67 %
Neutro Abs: 2.3 10*3/uL (ref 1.7–7.7)
Platelets: 271 10*3/uL (ref 150–400)
RBC: 4.17 MIL/uL (ref 3.87–5.11)
RDW: 13.5 % (ref 11.5–15.5)
WBC: 3.5 10*3/uL — ABNORMAL LOW (ref 4.0–10.5)

## 2017-03-16 LAB — COMPREHENSIVE METABOLIC PANEL
ALBUMIN: 4.4 g/dL (ref 3.5–5.0)
ALT: 13 U/L — AB (ref 14–54)
ANION GAP: 8 (ref 5–15)
AST: 17 U/L (ref 15–41)
Alkaline Phosphatase: 62 U/L (ref 38–126)
BILIRUBIN TOTAL: 0.5 mg/dL (ref 0.3–1.2)
BUN: 13 mg/dL (ref 6–20)
CALCIUM: 9.3 mg/dL (ref 8.9–10.3)
CO2: 24 mmol/L (ref 22–32)
CREATININE: 0.66 mg/dL (ref 0.44–1.00)
Chloride: 107 mmol/L (ref 101–111)
GFR calc Af Amer: >60 mL/min (ref >60)
GFR calc non Af Amer: >60 mL/min (ref >60)
GLUCOSE: 98 mg/dL (ref 65–99)
Potassium: 3.6 mmol/L (ref 3.5–5.1)
Sodium: 139 mmol/L (ref 135–145)
TOTAL PROTEIN: 7.2 g/dL (ref 6.5–8.1)

## 2017-03-16 MED ORDER — NICOTINE 14 MG/24HR TD PT24
14.0000 mg | MEDICATED_PATCH | Freq: Once | TRANSDERMAL | Status: DC
  Administered 2017-03-16: 14 mg via TRANSDERMAL
  Filled 2017-03-16: qty 1

## 2017-03-16 MED ORDER — ACETAMINOPHEN 325 MG PO TABS
650.0000 mg | ORAL_TABLET | Freq: Three times a day (TID) | ORAL | Status: DC | PRN
  Administered 2017-03-17: 650 mg via ORAL
  Filled 2017-03-16: qty 2

## 2017-03-16 MED ORDER — LORAZEPAM 1 MG PO TABS
1.0000 mg | ORAL_TABLET | Freq: Once | ORAL | Status: AC
  Administered 2017-03-16: 1 mg via ORAL
  Filled 2017-03-16: qty 1

## 2017-03-16 MED ORDER — SODIUM CHLORIDE 0.9 % IV BOLUS (SEPSIS)
1000.0000 mL | Freq: Once | INTRAVENOUS | Status: AC
  Administered 2017-03-16: 1000 mL via INTRAVENOUS

## 2017-03-16 MED ORDER — LORAZEPAM 2 MG/ML IJ SOLN
1.0000 mg | Freq: Once | INTRAMUSCULAR | Status: AC
  Administered 2017-03-16: 1 mg via INTRAVENOUS
  Filled 2017-03-16: qty 1

## 2017-03-16 MED ORDER — KETOROLAC TROMETHAMINE 30 MG/ML IJ SOLN
30.0000 mg | Freq: Once | INTRAMUSCULAR | Status: AC
  Administered 2017-03-16: 30 mg via INTRAVENOUS
  Filled 2017-03-16: qty 1

## 2017-03-16 NOTE — ED Notes (Signed)
RN will collect labs at IV start 

## 2017-03-16 NOTE — ED Triage Notes (Addendum)
Pt reports since she started on buspar on 8/20 she has felt unwell. Pt reports she has difficulty sleeping, nausea, occasional emesis, and a HA. Pt reports she feels like she has lost weight and has generalized body aches.

## 2017-03-16 NOTE — ED Notes (Signed)
Patient says that since she started Buspar, she's not felt like herself. Says she has major mood swings and can't seem to sleep or get it together for her family and she wants a different solution or medication for her anxiety. States she still takes the medication and has not stopped it abruptly.

## 2017-03-16 NOTE — BH Assessment (Signed)
[facility] Assessment Progress Note  Case was staffed with Jannifer Franklin MD, Cresenciano Genre who recommended patient be monitored over night and will consider a medication adjustment.

## 2017-03-16 NOTE — ED Provider Notes (Signed)
[facility]-EMERGENCY DEPT Provider Note   CSN: [id number] Arrival date & time: 03/16/17  1003     History   Chief Complaint Chief Complaint  Patient presents with  . Headache    HPI Sabrina Rasmussen is a 26 y.o. female.  The history is provided by the patient and medical records. No language interpreter was used.  Headache     Sabrina Rasmussen is a 26 y.o. female  with a PMH of anxiety who presents to the Emergency Department complaining of multiple complaints including headaches, nausea, decreased appetite, trouble sleeping. She experienced a miscarriage in June of this year. Since that time, she has been experiencing these symptoms and does not feel like herself. She thought that she would feel better by now but symptoms are not improving. She saw PCP on 8/15 where she was started on Buspar and referred to psychiatry. She has not been contacted about psych referral yet. She has been taking Buspar twice daily as directed, but states that it has not helped at all. She actually feels worse as she is now having worsening headaches and nightmares. She denies any suicidal or homicidal thoughts. No auditory or visual hallucinations. Patient has a 79 year old child at home and states that she is getting irritable with her child and this concerns her. She states that she wants to love her child and knows that she wouldn't hurt her, however just has a hard time seeing child because it brings back flashbacks of her miscarriage.     Past Medical History:  Diagnosis Date  . Anxiety   . Panic attack   . Pregnant state, incidental     There are no active problems to display for this patient.   Past Surgical History:  Procedure Laterality Date  . ABSCESS DRAINAGE    . CESAREAN SECTION      OB History    Gravida Para Term Preterm AB Living   SAB TAB Ectopic Multiple Live Births   1       1       Home Medications    Prior to Admission medications   Medication Sig Start  Date End Date Taking? Authorizing Provider  busPIRone (BUSPAR) 5 MG tablet Take 1 tablet (5 mg total) by mouth 2 (two) times daily. 02/14/17  Yes Massie Maroon, FNP  cyclobenzaprine (FLEXERIL) 5 MG tablet Take 1 tablet (5 mg total) by mouth 3 (three) times daily as needed for muscle spasms. 02/14/17  Yes Massie Maroon, FNP  norethindrone-ethinyl estradiol (LOESTRIN 1/20, 21,) 1-20 MG-MCG tablet Take 1 tablet by mouth daily. 02/14/17  Yes Massie Maroon, FNP  ibuprofen (ADVIL,MOTRIN) 600 MG tablet Take 1 tablet (600 mg total) by mouth every 6 (six) hours as needed. Patient not taking: Reported on 03/16/2017 02/05/17   Anselm Pancoast, PA-C    Family History History reviewed. No pertinent family history.  Social History Social History  Substance Use Topics  . Smoking status: Current Some Day Smoker    Packs/day: 0.50    Types: Cigarettes  . Smokeless tobacco: Never Used  . Alcohol use No     Allergies   Patient has no known allergies.   Review of Systems Review of Systems  Constitutional: Positive for fatigue.  Neurological: Positive for headaches.  Psychiatric/Behavioral: Positive for dysphoric mood. Negative for suicidal ideas. The patient is nervous/anxious.   All other systems reviewed and are negative.  Physical Exam Updated Vital Signs BP 119/79 (BP Location: Left Arm)   Pulse 68   Temp 98.1 F (36.7 C) (Oral)   Resp 17   SpO2 99%   Physical Exam  Constitutional: She is oriented to person, place, and time. She appears well-developed and well-nourished. No distress.  HENT:  Head: Normocephalic and atraumatic.  Cardiovascular: Normal rate, regular rhythm and normal heart sounds.   No murmur heard. Pulmonary/Chest: Effort normal and breath sounds normal. No respiratory distress. She has no wheezes. She has no rales.  Abdominal: Soft. She exhibits no distension. There is no tenderness.  Musculoskeletal: She exhibits no edema.  Neurological: She is alert and  oriented to person, place, and time.  Speech clear and goal oriented. CN 2-12 grossly intact. Normal finger-to-nose and rapid alternating movements. No drift. Strength and sensation intact. Steady gait.  Skin: Skin is warm and dry.  Tearful  Nursing note and vitals reviewed.    ED Treatments / Results  Labs (all labs ordered are listed, but only abnormal results are displayed) Labs Reviewed  CBC WITH DIFFERENTIAL/PLATELET  COMPREHENSIVE METABOLIC PANEL    EKG  EKG Interpretation None       Radiology No results found.  Procedures Procedures (including critical care time)  Medications Ordered in ED Medications  sodium chloride 0.9 % bolus 1,000 mL (1,000 mLs Intravenous New Bag/Given 03/16/17 1103)  ketorolac (TORADOL) 30 MG/ML injection 30 mg (30 mg Intravenous Given 03/16/17 1103)     Initial Impression / Assessment and Plan / ED Course  I have reviewed the triage vital signs and the nursing notes.  Pertinent labs & imaging results that were available during my care of the patient were reviewed by me and considered in my medical decision making (see chart for details).    Sabrina Rasmussen is a 26 y.o. female who presents to ED for a constellation of symptoms including difficulty sleeping, headache, nausea, decreased appetite, anhedonia. Seen by PCP for similar on 8/20 and started on Buspar with no improvement in symptoms. She is quite tearful and symptoms concerning for depression. No SI/HI or auditory/visual hallucinations. Labs reviewed and reassuring. Normal neuro exam and benign abdominal exam. Labs reassuring. Toradol and fluids given with improvement in headache. TTS consulted who recommends overnight observation for possible medication management. Patient aware and agreeable to plan.   Patient discussed with Dr. Donnald Garre who agrees with treatment plan.    Final Clinical Impressions(s) / ED Diagnoses   Final diagnoses:  None    New Prescriptions New  Prescriptions   No medications on file     Ward, Chase Picket, PA-C 03/16/17 1618    Arby Barrette, MD 03/16/17 1642

## 2017-03-16 NOTE — BH Assessment (Addendum)
Assessment Note  Sabrina Rasmussen is an 26 y.o. female that presents this date with ongoing depression and GAD. Patient states she recently had a miscarriage (12/02/16 per note review) and since then has noticed increased depression and excessive anxiety. Patient denies any S/I, H/I or AVH. Patient is oriented to time/place but is very tearful as she interacts with this Clinical research associate. Patient denies any previous attempts/gestures at self harm or self injurious behaviors. Patient states she was recently started on Buspar by her PCP Hart Rochester CRNP) on 02/19/17 reporting taking 5 mg BID but reports her symptoms have gotten worse. Patient states she suffers from ongoing depression with symptoms to include: guilt, feeling worthless and helpless. Patient states she is only sleeping "a couple hours a night" and has displayed increased agitation towards her 59 year old child and current partner. Patient also states she has lost over 6 pounds and her anxiety has been so bad that she "just cries all the time." Patient states she would like to be prescribed some medication/s to assist with her GAD and depression. Patient also stated she would like to become involved in some supportive therapy. Per notes, patient says that since she started Buspar, she's not felt like herself. Says she has major mood swings and can't seem to sleep or get it together for her family and she wants a different solution or medication for her anxiety. States she still takes the medication and has not stopped it abruptly. Patient reports since she started on buspar on 8/20 she has felt unwell. Patient reports she has difficulty sleeping and nausea. Patient has a history of Cannabis use stating she uses 1 gram "once or twice a month" but denies current use. Patient denies any other illicit SA use. Per note review, patient experienced a miscarriage in June of this year. Since that time, she has been experiencing increased anxiety and depression. Patient stated she  thought that she would feel better by now but symptoms are not improving. She saw PCP on 8/15 where she was started on Buspar and referred to psychiatry. She has not been contacted about psychiatric referral yet and felt she needed to present to [facility] this date to address symptoms. She has been taking Buspar twice daily as directed, but states that it has not helped at all. She actually feels worse as she is now having worsening headaches and nightmares. She denies any suicidal or homicidal thoughts. No auditory or visual hallucinations. Patient has a 61 year old child at home and states that she is getting irritable with her child and this concerns her. She states that she wants to love her child and knows that she wouldn't hurt her, however she finds it difficult interacting with her [place] child because it brings back flashbacks of her miscarriage. Case was staffed with Jannifer Franklin MD, Cresenciano Genre who recommended patient be monitored over night and will consider a medication adjustment.   Diagnosis: MDD without psychotic features, severe GAD  Past Medical History:  Past Medical History:  Diagnosis Date  . Anxiety   . Panic attack   . Pregnant state, incidental     Past Surgical History:  Procedure Laterality Date  . ABSCESS DRAINAGE    . CESAREAN SECTION      Family History: History reviewed. No pertinent family history.  Social History:  reports that she has been smoking Cigarettes.  She has been smoking about 0.50 packs per day. She has never used smokeless tobacco. She reports that she uses drugs, including  Marijuana. She reports that she does not drink alcohol.  Additional Social History:  Alcohol / Drug Use Pain Medications: See MAR Prescriptions: See MAR Over the Counter: See MAR History of alcohol / drug use?: Yes Longest period of sobriety (when/how long): Unknown Negative Consequences of Use:  (denies) Withdrawal Symptoms:  (Denies) Substance #1 Name of Substance 1: Cannabis 1 -  Age of First Use: 19 1 - Amount (size/oz): 1 gram 1 - Frequency: Once or twice a month 1 - Duration: Last year 1 - Last Use / Amount: 12/01/16 Amount unknown   CIWA: CIWA-Ar BP: 117/80 Pulse Rate: 93 COWS:    Allergies: No Known Allergies  Home Medications:  (Not in a hospital admission)  OB/GYN Status:  No LMP recorded.  General Assessment Data Location of Assessment: [facility] ED TTS Assessment: In system Is this a Tele or Face-to-Face Assessment?: Face-to-Face Is this an Initial Assessment or a Re-assessment for this encounter?: Initial Assessment Marital status: Single Maiden name: NA Is patient pregnant?: No Pregnancy Status: No Living Arrangements: Children Can pt return to current living arrangement?: Yes Admission Status: Voluntary Is patient capable of signing voluntary admission?: Yes Referral Source: Self/Family/Friend Insurance type: Self Pay  Medical Screening Exam [facility] Walk-in ONLY) Medical Exam completed: Yes  Crisis Care Plan Living Arrangements: Children Legal Guardian:  (NA) Name of Psychiatrist: None Name of Therapist: None  Education Status Is patient currently in school?: No Current Grade:  (NA) Highest grade of school patient has completed:  (Some college) Name of school: NA Contact person: NA  Risk to self with the past 6 months Suicidal Ideation: No Has patient been a risk to self within the past 6 months prior to admission? : No Suicidal Intent: No Has patient had any suicidal intent within the past 6 months prior to admission? : No Is patient at risk for suicide?: No Suicidal Plan?: No Has patient had any suicidal plan within the past 6 months prior to admission? : No Access to Means: No What has been your use of drugs/alcohol within the last 12 months?: Current use Previous Attempts/Gestures: No How many times?: 0 Other Self Harm Risks: NA Triggers for Past Attempts: Unknown Intentional Self Injurious Behavior: None Family Suicide  History: No Recent stressful life event(s): Other (Comment) (Recent miscarriage) Persecutory voices/beliefs?: No Depression: Yes Depression Symptoms: Guilt Substance abuse history and/or treatment for substance abuse?: No Suicide prevention information given to non-admitted patients: Not applicable  Risk to Others within the past 6 months Homicidal Ideation: No Does patient have any lifetime risk of violence toward others beyond the six months prior to admission? : No Thoughts of Harm to Others: No Current Homicidal Intent: No Current Homicidal Plan: No Access to Homicidal Means: No Identified Victim: NA History of harm to others?: No Assessment of Violence: None Noted Violent Behavior Description: NA Does patient have access to weapons?: No Criminal Charges Pending?: No Does patient have a court date: No Is patient on probation?: No  Psychosis Hallucinations: None noted Delusions: None noted  Mental Status Report Appearance/Hygiene: Unremarkable Eye Contact: Fair Motor Activity: Freedom of movement Speech: Soft Level of Consciousness: Quiet/awake Mood: Depressed Affect: Fearful, Frightened Anxiety Level: Moderate Thought Processes: Coherent, Relevant Judgement: Unimpaired Orientation: Person, Place, Time Obsessive Compulsive Thoughts/Behaviors: None  Cognitive Functioning Concentration: Good Memory: Recent Intact, Remote Intact IQ: Average Insight: Fair Impulse Control: Fair Appetite: Poor Weight Loss: 6 Weight Gain: 0 Sleep: Decreased Total Hours of Sleep: 3 Vegetative Symptoms: None  ADLScreening [facility] Assessment Services)  Patient's cognitive ability adequate to safely complete daily activities?: Yes Patient able to express need for assistance with ADLs?: Yes Independently performs ADLs?: Yes (appropriate for developmental age)  Prior Inpatient Therapy Prior Inpatient Therapy: No Prior Therapy Dates: NA Prior Therapy Facilty/Provider(s): NA Reason for  Treatment: NA  Prior Outpatient Therapy Prior Outpatient Therapy: No Prior Therapy Dates: NA Prior Therapy Facilty/Provider(s): NA Reason for Treatment: NA Does patient have an ACCT team?: No Does patient have Intensive In-House Services?  : No Does patient have [facility] services? : No Does patient have P4CC services?: No  ADL Screening (condition at time of admission) Patient's cognitive ability adequate to safely complete daily activities?: Yes Is the patient deaf or have difficulty hearing?: No Does the patient have difficulty seeing, even when wearing glasses/contacts?: No Does the patient have difficulty concentrating, remembering, or making decisions?: No Patient able to express need for assistance with ADLs?: Yes Does the patient have difficulty dressing or bathing?: No Independently performs ADLs?: Yes (appropriate for developmental age) Does the patient have difficulty walking or climbing stairs?: No Weakness of Legs: None Weakness of Arms/Hands: None  Home Assistive Devices/Equipment Home Assistive Devices/Equipment: None  Therapy Consults (therapy consults require a physician order) PT Evaluation Needed: No OT Evalulation Needed: No SLP Evaluation Needed: No Abuse/Neglect Assessment (Assessment to be complete while patient is alone) Physical Abuse: Denies Verbal Abuse: Denies Sexual Abuse: Denies Exploitation of patient/patient's resources: Denies Self-Neglect: Denies Values / Beliefs Cultural Requests During Hospitalization: None Spiritual Requests During Hospitalization: None Consults Spiritual Care Consult Needed: No Social Work Consult Needed: No Merchant navy officer (For Healthcare) Does Patient Have a Medical Advance Directive?: No Would patient like information on creating a medical advance directive?: No - Patient declined    Additional Information 1:1 In Past 12 Months?: No CIRT Risk: No Elopement Risk: No Does patient have medical clearance?:  Yes     Disposition:  Case was staffed with Jannifer Franklin MD, Cresenciano Genre who recommended patient be monitored over night and will consider a medication adjustment.   Disposition Initial Assessment Completed for this Encounter: Yes Disposition of Patient: Other dispositions Other disposition(s): Other (Comment) (Pt will be re-evaluated in the a.m.)  On Site Evaluation by:   Reviewed with Physician:    Alfredia Ferguson 03/16/2017 3:40 PM

## 2017-03-17 DIAGNOSIS — F322 Major depressive disorder, single episode, severe without psychotic features: Secondary | ICD-10-CM

## 2017-03-17 DIAGNOSIS — F1290 Cannabis use, unspecified, uncomplicated: Secondary | ICD-10-CM

## 2017-03-17 DIAGNOSIS — R4587 Impulsiveness: Secondary | ICD-10-CM

## 2017-03-17 DIAGNOSIS — R450 Nervousness: Secondary | ICD-10-CM

## 2017-03-17 DIAGNOSIS — F411 Generalized anxiety disorder: Secondary | ICD-10-CM

## 2017-03-17 DIAGNOSIS — F329 Major depressive disorder, single episode, unspecified: Secondary | ICD-10-CM | POA: Diagnosis present

## 2017-03-17 DIAGNOSIS — F17210 Nicotine dependence, cigarettes, uncomplicated: Secondary | ICD-10-CM

## 2017-03-17 MED ORDER — FLUOXETINE HCL 10 MG PO CAPS: 10.0000 mg | ORAL_CAPSULE | Freq: Every day | ORAL | Status: DC

## 2017-03-17 MED ORDER — HYDROXYZINE HCL 25 MG PO TABS: 25.0000 mg | ORAL_TABLET | Freq: Every day | ORAL | Status: DC

## 2017-03-17 MED ORDER — LAMOTRIGINE 25 MG PO TABS: 25.0000 mg | ORAL_TABLET | Freq: Every day | ORAL | Status: DC

## 2017-03-17 NOTE — ED Notes (Signed)
Pt requesting more information about medications prescribed, and waiting for psych to come talk to her before taking.  Pharmacy requesting a preg test in order to be able to release medications. Informed pharmacy of situation in regards to pt's request.

## 2017-03-17 NOTE — [facility] Suicide Risk Assessment (Signed)
Suicide Risk Assessment  Discharge Assessment   [facility] Discharge Suicide Risk Assessment   Principal Problem: Major depressive disorder with single episode Discharge Diagnoses:  Patient Active Problem List   Diagnosis Date Noted  . Major depressive disorder with single episode [F32.9] 03/17/2017  . GAD (generalized anxiety disorder) [F41.1] 03/17/2017    Total Time spent with patient: 45 minutes  Musculoskeletal: Strength & Muscle Tone: within normal limits Gait & Station: normal Patient leans: N/A  Psychiatric Specialty Exam: Physical Exam  Constitutional: She appears well-developed and well-nourished.  Respiratory: Effort normal.  Musculoskeletal: Normal range of motion.  Neurological: She is alert.  Psychiatric: Her speech is normal and behavior is normal. Thought content normal. Her mood appears anxious. Cognition and memory are normal. She expresses impulsivity. She exhibits a depressed mood.   Review of Systems  Psychiatric/Behavioral: Positive for depression and substance abuse. Negative for hallucinations, memory loss and suicidal ideas. The patient is nervous/anxious. The patient does not have insomnia.   All other systems reviewed and are negative.  Blood pressure 110/73, pulse (!) 103, temperature 97.9 F (36.6 C), temperature source Oral, resp. rate 14, SpO2 100 %, unknown if currently breastfeeding.There is no height or weight on file to calculate BMI. General Appearance: Casual Eye Contact:  Good Speech:  Clear and Coherent Volume:  Normal Mood:  Anxious and Depressed Affect:  Congruent and Depressed Thought Process:  Coherent, Goal Directed and Linear Orientation:  Full (Time, Place, and Person) Thought Content:  Logical Suicidal Thoughts:  No Homicidal Thoughts:  No Memory:  Immediate;   Good Recent;   Good Remote;   Fair Judgement:  Fair Insight:  Fair Psychomotor Activity:  Normal Concentration:  Concentration: Good and Attention Span: Good Recall:   Good Fund of Knowledge:  Good Language:  Good Akathisia:  No Handed:  Right AIMS (if indicated):    Assets:  Communication Skills Desire for Improvement Financial Resources/Insurance Housing Social Support ADL's:  Intact Cognition:  WNL  Mental Status Per Nursing Assessment::   On Admission:   Depressed and anxious  Demographic Factors:  Adolescent or young adult and Low socioeconomic status  Loss Factors: Financial problems/change in socioeconomic status  Historical Factors: Impulsivity  Risk Reduction Factors:   Responsible for children under 36 years of age, Sense of responsibility to family and Living with another person, especially a relative  Continued Clinical Symptoms:  Depression:   Impulsivity Alcohol/Substance Abuse/Dependencies  Cognitive Features That Contribute To Risk:  Closed-mindedness    Suicide Risk:  Minimal: No identifiable suicidal ideation.  Patients presenting with no risk factors but with morbid ruminations; may be classified as minimal risk based on the severity of the depressive symptoms    Plan Of Care/Follow-up recommendations:  Activity:  as tolerated Diet:  Heart Healthy  Laveda Abbe, NP 03/17/2017, 11:59 AM

## 2017-03-17 NOTE — ED Notes (Addendum)
Pt A/Ox4, ambulatory. Pt verbalizes understanding of d/c instructions and follow up appointment. All belonging with pt upon departure. Work note given.

## 2017-03-17 NOTE — ED Notes (Signed)
Pt sleeping quietly at this time with even, unlabored respirations. Will continue to monitor 

## 2017-03-17 NOTE — ED Notes (Signed)
MD Akintayo at the bedside

## 2017-03-17 NOTE — Consult Note (Signed)
[facility] Consult   Reason for Consult:  Depression and anxiety Referring Physician:  EDP Patient Identification: Sabrina Rasmussen MRN:  [id number] Principal Diagnosis: Major depressive disorder with single episode Diagnosis:   Patient Active Problem List   Diagnosis Date Noted  . Major depressive disorder with single episode [F32.9] 03/17/2017  . GAD (generalized anxiety disorder) [F41.1] 03/17/2017    Total Time spent with patient: 45 minutes  Subjective:   Sabrina Rasmussen is a 26 y.o. female patient admitted with depression and anxiety.  HPI: Pt was seen and chart reviewed with treatment team and Dr Darleene Cleaver. Pt presented to the [facility] with worsening depression and excessive anxiety. Pt has a history of GAD and recently suffered a miscarriage which has exacerbated her depression. Pt denies suicidal ideations, auditory and visual hallucinations and does not appear to be responding to internal stimuli. Pt stated she has increased aggression toward him. Pt has never acted on her thoughts and wants to be involved in supportive outpatient therapy. Pt is psychiatrically cleared for discharge.   Past Psychiatric History: As above  Risk to Self: Suicidal Ideation: No Suicidal Intent: No Is patient at risk for suicide?: No Suicidal Plan?: No Access to Means: No What has been your use of drugs/alcohol within the last 12 months?: Current use How many times?: 0 Other Self Harm Risks: NA Triggers for Past Attempts: Unknown Intentional Self Injurious Behavior: None Risk to Others: Homicidal Ideation: No Thoughts of Harm to Others: No Current Homicidal Intent: No Current Homicidal Plan: No Access to Homicidal Means: No Identified Victim: NA History of harm to others?: No Assessment of Violence: None Noted Violent Behavior Description: NA Does patient have access to weapons?: No Criminal Charges Pending?: No Does patient have a court date: No Prior Inpatient Therapy:  Prior Inpatient Therapy: No Prior Therapy Dates: NA Prior Therapy Facilty/Provider(s): NA Reason for Treatment: NA Prior Outpatient Therapy: Prior Outpatient Therapy: No Prior Therapy Dates: NA Prior Therapy Facilty/Provider(s): NA Reason for Treatment: NA Does patient have an ACCT team?: No Does patient have Intensive In-House Services?  : No Does patient have [facility] services? : No Does patient have P4CC services?: No  Past Medical History:  Past Medical History:  Diagnosis Date  . Anxiety   . Panic attack   . Pregnant state, incidental     Past Surgical History:  Procedure Laterality Date  . ABSCESS DRAINAGE    . CESAREAN SECTION     Family History: History reviewed. No pertinent family history. Family Psychiatric  History: Unknown Social History:  History  Alcohol Use No     History  Drug Use  . Types: Marijuana    Social History   Social History  . Marital status: Single    Spouse name: N/A  . Number of children: N/A  . Years of education: N/A   Social History Main Topics  . Smoking status: Current Some Day Smoker    Packs/day: 0.50    Types: Cigarettes  . Smokeless tobacco: Never Used  . Alcohol use No  . Drug use: Yes    Types: Marijuana  . Sexual activity: Yes    Birth control/ protection: None   Other Topics Concern  . None   Social History Narrative  . None   Additional Social History:    Allergies:  No Known Allergies  Labs:  Results for orders placed or performed during the hospital encounter of 03/16/17 (from the past 48 hour(s))  CBC with Differential  Status: Abnormal   Collection Time: 03/16/17 10:59 AM  Result Value Ref Range   WBC 3.5 (L) 4.0 - 10.5 K/uL   RBC 4.17 3.87 - 5.11 MIL/uL   Hemoglobin 13.1 12.0 - 15.0 g/dL   HCT 37.7 36.0 - 46.0 %   MCV 90.4 78.0 - 100.0 fL   MCH 31.4 26.0 - 34.0 pg   MCHC 34.7 30.0 - 36.0 g/dL   RDW 13.5 11.5 - 15.5 %   Platelets 271 150 - 400 K/uL   Neutrophils Relative % 67 %   Neutro  Abs 2.3 1.7 - 7.7 K/uL   Lymphocytes Relative 28 %   Lymphs Abs 1.0 0.7 - 4.0 K/uL   Monocytes Relative 4 %   Monocytes Absolute 0.2 0.1 - 1.0 K/uL   Eosinophils Relative 1 %   Eosinophils Absolute 0.0 0.0 - 0.7 K/uL   Basophils Relative 0 %   Basophils Absolute 0.0 0.0 - 0.1 K/uL  Comprehensive metabolic panel     Status: Abnormal   Collection Time: 03/16/17 10:59 AM  Result Value Ref Range   Sodium 139 135 - 145 mmol/L   Potassium 3.6 3.5 - 5.1 mmol/L   Chloride 107 101 - 111 mmol/L   CO2 24 22 - 32 mmol/L   Glucose, Bld 98 65 - 99 mg/dL   BUN 13 6 - 20 mg/dL   Creatinine, Ser 0.66 0.44 - 1.00 mg/dL   Calcium 9.3 8.9 - 10.3 mg/dL   Total Protein 7.2 6.5 - 8.1 g/dL   Albumin 4.4 3.5 - 5.0 g/dL   AST 17 15 - 41 U/L   ALT 13 (L) 14 - 54 U/L   Alkaline Phosphatase 62 38 - 126 U/L   Total Bilirubin 0.5 0.3 - 1.2 mg/dL   GFR calc non Af Amer >60 >60 mL/min   GFR calc Af Amer >60 >60 mL/min    Comment: (NOTE) The eGFR has been calculated using the CKD EPI equation. This calculation has not been validated in all clinical situations. eGFR's persistently <60 mL/min signify possible Chronic Kidney Disease.    Anion gap 8 5 - 15    Current Facility-Administered Medications  Medication Dose Route Frequency Provider Last Rate Last Dose  . acetaminophen (TYLENOL) tablet 650 mg  650 mg Oral Q8H PRN Ward, Ozella Almond, PA-C   650 mg at 03/17/17 0912  . hydrOXYzine (ATARAX/VISTARIL) tablet 25 mg  25 mg Oral QHS Gabriell Daigneault, MD      . nicotine (NICODERM CQ - dosed in mg/24 hours) patch 14 mg  14 mg Transdermal Once Ward, Ozella Almond, PA-C   14 mg at 03/16/17 1953   Current Outpatient Prescriptions  Medication Sig Dispense Refill  . busPIRone (BUSPAR) 5 MG tablet Take 1 tablet (5 mg total) by mouth 2 (two) times daily. 60 tablet 5  . cyclobenzaprine (FLEXERIL) 5 MG tablet Take 1 tablet (5 mg total) by mouth 3 (three) times daily as needed for muscle spasms. 30 tablet 1  .  norethindrone-ethinyl estradiol (LOESTRIN 1/20, 21,) 1-20 MG-MCG tablet Take 1 tablet by mouth daily. 1 Package 3  . ibuprofen (ADVIL,MOTRIN) 600 MG tablet Take 1 tablet (600 mg total) by mouth every 6 (six) hours as needed. (Patient not taking: Reported on 03/16/2017) 30 tablet 0    Musculoskeletal: Strength & Muscle Tone: within normal limits Gait & Station: normal Patient leans: N/A  Psychiatric Specialty Exam: Physical Exam  Constitutional: She appears well-developed and well-nourished.  Respiratory: Effort normal.  Musculoskeletal:  Normal range of motion.  Neurological: She is alert.  Psychiatric: Her speech is normal and behavior is normal. Thought content normal. Her mood appears anxious. Cognition and memory are normal. She expresses impulsivity. She exhibits a depressed mood.    Review of Systems  Psychiatric/Behavioral: Positive for depression and substance abuse. Negative for hallucinations, memory loss and suicidal ideas. The patient is nervous/anxious. The patient does not have insomnia.   All other systems reviewed and are negative.   Blood pressure 110/73, pulse (!) 103, temperature 97.9 F (36.6 C), temperature source Oral, resp. rate 14, SpO2 100 %, unknown if currently breastfeeding.There is no height or weight on file to calculate BMI.  General Appearance: Casual  Eye Contact:  Good  Speech:  Clear and Coherent  Volume:  Normal  Mood:  Anxious and Depressed  Affect:  Congruent and Depressed  Thought Process:  Coherent, Goal Directed and Linear  Orientation:  Full (Time, Place, and Person)  Thought Content:  Logical  Suicidal Thoughts:  No  Homicidal Thoughts:  No  Memory:  Immediate;   Good Recent;   Good Remote;   Fair  Judgement:  Fair  Insight:  Fair  Psychomotor Activity:  Normal  Concentration:  Concentration: Good and Attention Span: Good  Recall:  Good  Fund of Knowledge:  Good  Language:  Good  Akathisia:  No  Handed:  Right  AIMS (if  indicated):     Assets:  Communication Skills Desire for Improvement Financial Resources/Insurance Housing Social Support  ADL's:  Intact  Cognition:  WNL  Sleep:        Treatment Plan Summary: Plan Major depressive disorder, single episode, severe  Discharge Home Follow up with [facility] services for medication management and psychiatry Avoid the use of alcohol and illicit drugs Take all medications as prescribed  Disposition: No evidence of imminent risk to self or others at present.   Patient does not meet criteria for psychiatric inpatient admission. Supportive therapy provided about ongoing stressors. Discussed crisis plan, support from social network, calling 911, coming to the Emergency Department, and calling Suicide Hotline.  Ethelene Hal, NP 03/17/2017 11:45 AM  Patient seen face-to-face for psychiatric evaluation, chart reviewed and case discussed with the physician extender and developed treatment plan. Reviewed the information documented and agree with the treatment plan. Corena Pilgrim, MD

## 2017-03-17 NOTE — ED Provider Notes (Signed)
Vitals:   03/16/17 2110 03/17/17 0322  BP: 119/75 (!) 92/55  Pulse: 91 74  Resp: 12 18  Temp: 98.2 F (36.8 C)   SpO2: 100% 100%   BP low this am.  Pt is resting.  Doubt clinically significant.  Continue to monitor.  Psych dispo pending   Linwood Dibbles, MD 03/17/17 [phone]

## 2017-03-19 ENCOUNTER — Encounter: Payer: Self-pay | Admitting: Family Medicine

## 2017-03-19 ENCOUNTER — Ambulatory Visit (INDEPENDENT_AMBULATORY_CARE_PROVIDER_SITE_OTHER): Payer: Self-pay | Admitting: Family Medicine

## 2017-03-19 VITALS — BP 113/65 | HR 77 | Temp 98.5°F | Resp 16 | Ht 63.0 in | Wt 120.0 lb

## 2017-03-19 DIAGNOSIS — F411 Generalized anxiety disorder: Secondary | ICD-10-CM

## 2017-03-19 DIAGNOSIS — F3289 Other specified depressive episodes: Secondary | ICD-10-CM

## 2017-03-19 MED ORDER — FLUOXETINE HCL 20 MG PO TABS: 20.0000 mg | ORAL_TABLET | Freq: Every day | ORAL | 3 refills | Status: DC

## 2017-03-19 MED ORDER — HYDROXYZINE HCL 10 MG PO TABS: 10.0000 mg | ORAL_TABLET | Freq: Three times a day (TID) | ORAL | 0 refills | Status: DC | PRN

## 2017-03-19 NOTE — Patient Instructions (Signed)
Will start a trial of Prozac 20 mg [facility] [address] [place], [place]  [place] clinic is M-F 8am-3pm   Fluoxetine capsules or tablets (Depression/Mood Disorders) What is this medicine? FLUOXETINE (floo OX e teen) belongs to a class of drugs known as selective serotonin reuptake inhibitors (SSRIs). It helps to treat mood problems such as depression, obsessive compulsive disorder, and panic attacks. It can also treat certain eating disorders. This medicine may be used for other purposes; ask your health care provider or pharmacist if you have questions. COMMON BRAND NAME(S): Prozac What should I tell my health care provider before I take this medicine? They need to know if you have any of these conditions: -bipolar disorder or a family history of bipolar disorder -bleeding disorders -glaucoma -heart disease -liver disease -low levels of sodium in the blood -seizures -suicidal thoughts, plans, or attempt; a previous suicide attempt by you or a family member -take MAOIs like Carbex, Eldepryl, Marplan, Nardil, and Parnate -take medicines that treat or prevent blood clots -thyroid disease -an unusual or allergic reaction to fluoxetine, other medicines, foods, dyes, or preservatives -pregnant or trying to get pregnant -breast-feeding How should I use this medicine? Take this medicine by mouth with a glass of water. Follow the directions on the prescription label. You can take this medicine with or without food. Take your medicine at regular intervals. Do not take it more often than directed. Do not stop taking this medicine suddenly except upon the advice of your doctor. Stopping this medicine too quickly may cause serious side effects or your condition may worsen. A special MedGuide will be given to you by the pharmacist with each prescription and refill. Be sure to read this information carefully each time. Talk to your pediatrician regarding the use of this  medicine in children. While this drug may be prescribed for children as young as 7 years for selected conditions, precautions do apply. Overdosage: If you think you have taken too much of this medicine contact a poison control center or emergency room at once. NOTE: This medicine is only for you. Do not share this medicine with others. What if I miss a dose? If you miss a dose, skip the missed dose and go back to your regular dosing schedule. Do not take double or extra doses. What may interact with this medicine? Do not take this medicine with any of the following medications: -other medicines containing fluoxetine, like Sarafem or Symbyax -cisapride -linezolid -MAOIs like Carbex, Eldepryl, Marplan, Nardil, and Parnate -methylene blue (injected into a vein) -pimozide -thioridazine This medicine may also interact with the following medications: -alcohol -amphetamines -aspirin and aspirin-like medicines -carbamazepine -certain medicines for depression, anxiety, or psychotic disturbances -certain medicines for migraine headaches like almotriptan, eletriptan, frovatriptan, naratriptan, rizatriptan, sumatriptan, zolmitriptan -digoxin -diuretics -fentanyl -flecainide -furazolidone -isoniazid -lithium -medicines for sleep -medicines that treat or prevent blood clots like warfarin, enoxaparin, and dalteparin -NSAIDs, medicines for pain and inflammation, like ibuprofen or naproxen -phenytoin -procarbazine -propafenone -rasagiline -ritonavir -supplements like St. John's wort, kava kava, valerian -tramadol -tryptophan -vinblastine This list may not describe all possible interactions. Give your health care provider a list of all the medicines, herbs, non-prescription drugs, or dietary supplements you use. Also tell them if you smoke, drink alcohol, or use illegal drugs. Some items may interact with your medicine. What should I watch for while using this medicine? Tell your doctor if  your symptoms do not get better or if they get worse. Visit your  doctor or health care professional for regular checks on your progress. Because it may take several weeks to see the full effects of this medicine, it is important to continue your treatment as prescribed by your doctor. Patients and their families should watch out for new or worsening thoughts of suicide or depression. Also watch out for sudden changes in feelings such as feeling anxious, agitated, panicky, irritable, hostile, aggressive, impulsive, severely restless, overly excited and hyperactive, or not being able to sleep. If this happens, especially at the beginning of treatment or after a change in dose, call your health care professional. Bonita Quin may get drowsy or dizzy. Do not drive, use machinery, or do anything that needs mental alertness until you know how this medicine affects you. Do not stand or sit up quickly, especially if you are an older patient. This reduces the risk of dizzy or fainting spells. Alcohol may interfere with the effect of this medicine. Avoid alcoholic drinks. Your mouth may get dry. Chewing sugarless gum or sucking hard candy, and drinking plenty of water may help. Contact your doctor if the problem does not go away or is severe. This medicine may affect blood sugar levels. If you have diabetes, check with your doctor or health care professional before you change your diet or the dose of your diabetic medicine. What side effects may I notice from receiving this medicine? Side effects that you should report to your doctor or health care professional as soon as possible: -allergic reactions like skin rash, itching or hives, swelling of the face, lips, or tongue -anxious -black, tarry stools -breathing problems -changes in vision -confusion -elevated mood, decreased need for sleep, racing thoughts, impulsive behavior -eye pain -fast, irregular heartbeat -feeling faint or lightheaded, falls -feeling agitated,  angry, or irritable -hallucination, loss of contact with reality -loss of balance or coordination -loss of memory -painful or prolonged erections -restlessness, pacing, inability to keep still -seizures -stiff muscles -suicidal thoughts or other mood changes -trouble sleeping -unusual bleeding or bruising -unusually weak or tired -vomiting Side effects that usually do not require medical attention (report to your doctor or health care professional if they continue or are bothersome): -change in appetite or weight -change in sex drive or performance -diarrhea -dry mouth -headache -increased sweating -nausea -tremors This list may not describe all possible side effects. Call your doctor for medical advice about side effects. You may report side effects to FDA at 1-800-FDA-1088. Where should I keep my medicine? Keep out of the reach of children. Store at room temperature between 15 and 30 degrees C (59 and 86 degrees F). Throw away any unused medicine after the expiration date. NOTE: This sheet is a summary. It may not cover all possible information. If you have questions about this medicine, talk to your doctor, pharmacist, or health care provider.  2018 Elsevier/Gold Standard (2015-11-20 15:55:27)

## 2017-03-19 NOTE — Progress Notes (Signed)
Subjective:     Sabrina Rasmussen is a 26 y.o. female who presents for follow up of anxiety disorder. Sabrina Rasmussen was started on Buspar 1 month ago for generalized anxiety disorder. She says that she has had a recurrent nightmare since starting medication.  Current symptoms: difficulty concentrating, feelings of losing control, irritable, racing thoughts. She denies current suicidal and homicidal ideation.   Past Medical History:  Diagnosis Date  . Anxiety   . Panic attack   . Pregnant state, incidental    Social History   Social History  . Marital status: Single    Spouse name: N/A  . Number of children: N/A  . Years of education: N/A   Occupational History  . Not on file.   Social History Main Topics  . Smoking status: Current Some Day Smoker    Packs/day: 0.50    Types: Cigarettes  . Smokeless tobacco: Never Used  . Alcohol use No  . Drug use: Yes    Types: Marijuana  . Sexual activity: Yes    Birth control/ protection: None   Other Topics Concern  . Not on file   Social History Narrative  . No narrative on file   No Known Allergies  GAD 7 : Generalized Anxiety Score 03/19/2017 02/14/2017  Nervous, Anxious, on Edge 3 3  Control/stop worrying 2 1  Worry too much - different things 2 2  Trouble relaxing 2 3  Restless 2 3  Easily annoyed or irritable 3 3  Afraid - awful might happen 2 3  Total GAD 7 Score 16 18  Anxiety Difficulty Extremely difficult Very difficult    Review of Systems  Constitutional: Negative.   Eyes: Negative.   Respiratory: Negative.   Cardiovascular: Negative.   Gastrointestinal: Negative.   Genitourinary: Negative.   Musculoskeletal: Negative.   Skin: Negative.   Neurological: Negative.   Endo/Heme/Allergies: Negative.   Psychiatric/Behavioral: Positive for depression. The patient is nervous/anxious and has insomnia.      Objective:    BP 113/65 (BP Location: Left Arm, Patient Position: Sitting, Cuff Size: Normal)   Pulse 77    Temp 98.5 F (36.9 C) (Oral)   Resp 16   Ht  (1.6 m)   Wt 120 lb (54.4 kg)   LMP 03/12/2017   SpO2 100%   BMI 21.26 kg/m    General:  alert and mild distress  Affect/Behavior:  normal perception, normal speech pattern and content and normal thought patterns agitation and flattened affect      Assessment:    Anxiety Disorder - Course worsening  Plan:  1. GAD (generalized anxiety disorder) Will discontinue Buspar due to nightmares. Patient denies suicidal or homicidal ideations.  Recommend [facility] [address]  [place], [place]  M-F 8am to 5 pm.  - FLUoxetine (PROZAC) 20 MG tablet; Take 1 tablet (20 mg total) by mouth daily.  Dispense: 30 tablet; Refill: 3 - hydrOXYzine (ATARAX/VISTARIL) 10 MG tablet; Take 1 tablet (10 mg total) by mouth 3 (three) times daily as needed for anxiety.  Dispense: 30 tablet; Refill: 0  2. Other depression - FLUoxetine (PROZAC) 20 MG tablet; Take 1 tablet (20 mg total) by mouth daily.  Dispense: 30 tablet; Refill:  Handouts describing disease, natural history, and treatment were given to the patient. Reviewed concept of anxiety as biochemical imbalance of neurotransmitters and rationale for treatment. Instructed patient to contact office or on-call physician promptly should condition worsen or any new symptoms  appear and provided on-call telephone numbers. IF THE PATIENT HAS ANY SUICIDAL OR HOMICIDAL IDEATIONS, CALL THE OFFICE, DISCUSS WITH A SUPPORT MEMBER, OR GO TO THE ER IMMEDIATELY. Patient was agreeable with this plan.      RTC: 1 month for GAD and depression   Abdulai Blaylock Rennis Petty  MSN, FNP-C Patient [facility] [address] [place], [place] [address] [phone]   50% of appointment spent counseling on diagnosis and medications.

## 2017-04-18 ENCOUNTER — Ambulatory Visit: Payer: Self-pay | Admitting: Family Medicine

## 2017-05-04 ENCOUNTER — Ambulatory Visit (INDEPENDENT_AMBULATORY_CARE_PROVIDER_SITE_OTHER): Payer: Self-pay | Admitting: Family Medicine

## 2017-05-04 ENCOUNTER — Other Ambulatory Visit (HOSPITAL_COMMUNITY)
Admission: RE | Admit: 2017-05-04 | Discharge: 2017-05-04 | Disposition: A | Discharge status: Home / Self Care | Payer: Medicaid Other | Source: Ambulatory Visit | Attending: Family Medicine | Admitting: Family Medicine

## 2017-05-04 ENCOUNTER — Encounter: Payer: Self-pay | Admitting: Family Medicine

## 2017-05-04 VITALS — BP 125/88 | HR 87 | Temp 98.4°F | Resp 16 | Ht 63.0 in | Wt 113.0 lb

## 2017-05-04 DIAGNOSIS — F411 Generalized anxiety disorder: Secondary | ICD-10-CM | POA: Diagnosis not present

## 2017-05-04 DIAGNOSIS — F3289 Other specified depressive episodes: Secondary | ICD-10-CM | POA: Insufficient documentation

## 2017-05-04 DIAGNOSIS — R300 Dysuria: Secondary | ICD-10-CM

## 2017-05-04 DIAGNOSIS — B9689 Other specified bacterial agents as the cause of diseases classified elsewhere: Secondary | ICD-10-CM | POA: Insufficient documentation

## 2017-05-04 DIAGNOSIS — F17210 Nicotine dependence, cigarettes, uncomplicated: Secondary | ICD-10-CM | POA: Insufficient documentation

## 2017-05-04 DIAGNOSIS — N898 Other specified noninflammatory disorders of vagina: Secondary | ICD-10-CM | POA: Diagnosis present

## 2017-05-04 DIAGNOSIS — N760 Acute vaginitis: Secondary | ICD-10-CM | POA: Diagnosis not present

## 2017-05-04 LAB — POCT URINALYSIS DIP (DEVICE)
Bilirubin Urine: NEGATIVE
GLUCOSE, UA: NEGATIVE mg/dL
Hgb urine dipstick: NEGATIVE
Ketones, ur: NEGATIVE mg/dL
Leukocytes, UA: NEGATIVE
NITRITE: NEGATIVE
PH: 6 (ref 5.0–8.0)
PROTEIN: NEGATIVE mg/dL
Specific Gravity, Urine: >=1.03 (ref 1.005–1.030)
UROBILINOGEN UA: 0.2 mg/dL (ref 0.0–1.0)

## 2017-05-04 MED ORDER — METRONIDAZOLE 500 MG PO TABS: 500.0000 mg | ORAL_TABLET | Freq: Two times a day (BID) | ORAL | 0 refills | Status: DC

## 2017-05-04 MED ORDER — HYDROXYZINE HCL 10 MG PO TABS: 10.0000 mg | ORAL_TABLET | Freq: Three times a day (TID) | ORAL | 2 refills | Status: DC | PRN

## 2017-05-04 NOTE — Progress Notes (Signed)
Sabrina Rasmussen, a 26 year old female presents complaining of vaginal discharge and vaginal odor over the past 3-4 days.  Patient states that she is not been sexually active in greater than a month and has not changed partners.  She has a history of recurrent bacterial vaginosis.  She describes current vaginal odor as "fishy".  She denies urinary frequency, dysuria, nausea, vomiting, or diarrhea.  Past Medical History:  Diagnosis Date  . Anxiety   . Panic attack   . Pregnant state, incidental    There is no immunization history on file for this patient.  Social History   Social History  . Marital status: Single    Spouse name: N/A  . Number of children: N/A  . Years of education: N/A   Occupational History  . Not on file.   Social History Main Topics  . Smoking status: Current Some Day Smoker    Packs/day: 0.50    Types: Cigarettes  . Smokeless tobacco: Never Used  . Alcohol use No  . Drug use: Yes    Types: Marijuana  . Sexual activity: Yes    Birth control/ protection: None   Other Topics Concern  . Not on file   Social History Narrative  . No narrative on file  No Known Allergies  Review of Systems  Constitutional: Negative for malaise/fatigue and weight loss.  HENT: Negative.   Cardiovascular: Negative.   Gastrointestinal: Negative.   Genitourinary: Negative for dysuria, frequency, hematuria and urgency.  Musculoskeletal: Negative.   Skin: Negative.   Neurological: Negative.   Endo/Heme/Allergies: Negative.   Psychiatric/Behavioral: Negative.   Physical Exam  Constitutional: She is oriented to person, place, and time.  Eyes: Pupils are equal, round, and reactive to light.  Neck: Normal range of motion.  Cardiovascular: Normal rate, regular rhythm and normal heart sounds.   Pulmonary/Chest: Effort normal and breath sounds normal.  Abdominal: Soft. Bowel sounds are normal.  Neurological: She is alert and oriented to person, place, and time.     Plan  1.  Vaginal discharge We will follow-up by phone with any abnormal laboratory results - Cervicovaginal ancillary only  2. Vaginal odor - Cervicovaginal ancillary only  3. Dysuria - Cervicovaginal ancillary only  4. Bacterial vaginosis - metroNIDAZOLE (FLAGYL) 500 MG tablet; Take 1 tablet (500 mg total) by mouth 2 (two) times daily.  Dispense: 14 tablet; Refill: 0  5. GAD (generalized anxiety disorder) .  Patient is requesting a refill on hydroxyzine. - hydrOXYzine (ATARAX/VISTARIL) 10 MG tablet; Take 1 tablet (10 mg total) by mouth 3 (three) times daily as needed for anxiety.  Dispense: 30 tablet; Refill: 2  6. Other depression - hydrOXYzine (ATARAX/VISTARIL) 10 MG tablet; Take 1 tablet (10 mg total) by mouth 3 (three) times daily as needed for anxiety.  Dispense: 30 tablet; Refill: 2   RTC: Follow-up as previously scheduled  Nolon NationsLaChina Moore Ferron Ishmael  MSN, FNP-C Patient Care [facility] [address]  [place], [place] [id number] [phone]

## 2017-05-04 NOTE — Patient Instructions (Signed)
We will follow-up by phone with any abnormal lab results. We will start metronidazole 500 mg twice daily for the next 7 days.  Refrain from drinking any alcohol while taking this medication to prevent unwanted side effects.  Please follow-up in office as previously scheduled.    Bacterial Vaginosis Bacterial vaginosis is an infection of the vagina. It happens when too many germs (bacteria) grow in the vagina. This infection puts you at risk for infections from sex (STIs). Treating this infection can lower your risk for some STIs. You should also treat this if you are pregnant. It can cause your baby to be born early. Follow these instructions at home: Medicines  Take over-the-counter and prescription medicines only as told by your doctor.  Take or use your antibiotic medicine as told by your doctor. Do not stop taking or using it even if you start to feel better. General instructions  If you your sexual partner is a woman, tell her that you have this infection. She needs to get treatment if she has symptoms. If you have a female partner, he does not need to be treated.  During treatment: ? Avoid sex. ? Do not douche. ? Avoid alcohol as told. ? Avoid breastfeeding as told.  Drink enough fluid to keep your pee (urine) clear or pale yellow.  Keep your vagina and butt (rectum) clean. ? Wash the area with warm water every day. ? Wipe from front to back after you use the toilet.  Keep all follow-up visits as told by your doctor. This is important. Preventing this condition  Do not douche.  Use only warm water to wash around your vagina.  Use protection when you have sex. This includes: ? Latex condoms. ? Dental dams.  Limit how many people you have sex with. It is best to only have sex with the same person (be monogamous).  Get tested for STIs. Have your partner get tested.  Wear underwear that is cotton or lined with cotton.  Avoid tight pants and pantyhose. This is most  important in summer.  Do not use any products that have nicotine or tobacco in them. These include cigarettes and e-cigarettes. If you need help quitting, ask your doctor.  Do not use illegal drugs.  Limit how much alcohol you drink. Contact a doctor if:  Your symptoms do not get better, even after you are treated.  You have more discharge or pain when you pee (urinate).  You have a fever.  You have pain in your belly (abdomen).  You have pain with sex.  Your bleed from your vagina between periods. Summary  This infection happens when too many germs (bacteria) grow in the vagina.  Treating this condition can lower your risk for some infections from sex (STIs).  You should also treat this if you are pregnant. It can cause early (premature) birth.  Do not stop taking or using your antibiotic medicine even if you start to feel better. This information is not intended to replace advice given to you by your health care provider. Make sure you discuss any questions you have with your health care provider. Document Released: 03/28/2008 Document Revised: 03/04/2016 Document Reviewed: 03/04/2016 Elsevier Interactive Patient Education  2017 [employer]

## 2017-05-07 ENCOUNTER — Telehealth: Payer: Self-pay | Admitting: Family Medicine

## 2017-05-07 ENCOUNTER — Other Ambulatory Visit: Payer: Self-pay | Admitting: Family Medicine

## 2017-05-07 DIAGNOSIS — A749 Chlamydial infection, unspecified: Secondary | ICD-10-CM

## 2017-05-07 LAB — CERVICOVAGINAL ANCILLARY ONLY
Bacterial vaginitis: POSITIVE — AB
CHLAMYDIA, DNA PROBE: POSITIVE — AB
NEISSERIA GONORRHEA: NEGATIVE
TRICH (WINDOWPATH): NEGATIVE

## 2017-05-07 MED ORDER — AZITHROMYCIN 500 MG PO TABS: 500.0000 mg | ORAL_TABLET | Freq: Once | ORAL | 0 refills | Status: AC

## 2017-05-07 NOTE — Telephone Encounter (Signed)
Reviewed laboratory results: positive for chlamydial infection. Will start the following medication regimen:   Meds ordered this encounter  Medications  . azithromycin (ZITHROMAX) 500 MG tablet    Sig: Take 1 tablet (500 mg total) once for 1 dose by mouth.    Dispense:  2 tablet    Refill:  0    Sabrina NationsLaChina Moore Hollis  MSN, FNP-C Patient [facility] [address]  [place], [place] [address] [phone]

## 2017-05-15 ENCOUNTER — Telehealth: Payer: Self-pay

## 2017-05-15 NOTE — Telephone Encounter (Signed)
Called, no answer. Left a message for patient to call back. Thanks!  

## 2017-05-15 NOTE — Telephone Encounter (Signed)
Patient called and states she is still having vaginal discharge but the smell is gone. She states she has taken all her medication. I advised her we will need to see her again if she is still having symptoms. She states she can not get off work but wanted to ask if there is anything we can do. Please advise.

## 2017-05-17 NOTE — Telephone Encounter (Signed)
Spoke with patient. Advised that she will have to come in to be tested and treated and she verbalized understanding. Thanks!

## 2017-05-17 NOTE — Telephone Encounter (Signed)
-----   Message from Massie MaroonLachina M Hollis, [place]NP sent at 05/17/2017  1:47 PM EST ----- Regarding: Patient Sabrina Rasmussen Please inform patient that there is nothing that I can do over the phone for vaginal discharge due to the fact that the vaginal discharge can be related to a number of different diagnoses.  She will have to come in so that we can repeat a vaginal swab.  She may have to schedule in early a.m. or late afternoon.   Thanks

## 2017-05-20 ENCOUNTER — Encounter (HOSPITAL_COMMUNITY): Payer: Self-pay

## 2017-05-20 ENCOUNTER — Ambulatory Visit (HOSPITAL_COMMUNITY)
Admission: EM | Admit: 2017-05-20 | Discharge: 2017-05-20 | Disposition: A | Discharge status: Home / Self Care | Payer: Medicaid Other | Attending: Family Medicine | Admitting: Family Medicine

## 2017-05-20 ENCOUNTER — Other Ambulatory Visit: Payer: Self-pay

## 2017-05-20 DIAGNOSIS — R1030 Lower abdominal pain, unspecified: Secondary | ICD-10-CM

## 2017-05-20 DIAGNOSIS — Z79899 Other long term (current) drug therapy: Secondary | ICD-10-CM | POA: Insufficient documentation

## 2017-05-20 DIAGNOSIS — N3000 Acute cystitis without hematuria: Secondary | ICD-10-CM

## 2017-05-20 DIAGNOSIS — F17210 Nicotine dependence, cigarettes, uncomplicated: Secondary | ICD-10-CM | POA: Insufficient documentation

## 2017-05-20 DIAGNOSIS — Z3202 Encounter for pregnancy test, result negative: Secondary | ICD-10-CM

## 2017-05-20 LAB — POCT URINALYSIS DIP (DEVICE)
GLUCOSE, UA: NEGATIVE mg/dL
Hgb urine dipstick: NEGATIVE
Ketones, ur: NEGATIVE mg/dL
Nitrite: NEGATIVE
PROTEIN: NEGATIVE mg/dL
UROBILINOGEN UA: 1 mg/dL (ref 0.0–1.0)
pH: 6 (ref 5.0–8.0)

## 2017-05-20 LAB — POCT PREGNANCY, URINE: Preg Test, Ur: NEGATIVE

## 2017-05-20 MED ORDER — CEPHALEXIN 500 MG PO CAPS: 500.0000 mg | ORAL_CAPSULE | Freq: Three times a day (TID) | ORAL | 0 refills | Status: DC

## 2017-05-20 NOTE — ED Notes (Signed)
Patient discharged by provider Lonia SkinnerLeslie K. Keenan BachelorSofia

## 2017-05-20 NOTE — ED Provider Notes (Signed)
[facility]   [id number] 05/20/17 Arrival Time: 1601   SUBJECTIVE:  Sabrina Rasmussen is a 26 y.o. female who presents to the urgent care with complaint of lower abdominal pain x2 days, pt reports burning during urination with a yellowish discharge. Pt had BV 2 weeks ago and was taking an antibiotic and has completed   Had oral sex performed on her recently and symptoms of BV came right back.  Past Medical History:  Diagnosis Date  . Anxiety   . Panic attack   . Pregnant state, incidental    History reviewed. No pertinent family history. Social History   Socioeconomic History  . Marital status: Single    Spouse name: Not on file  . Number of children: Not on file  . Years of education: Not on file  . Highest education level: Not on file  Social Needs  . Financial resource strain: Not on file  . Food insecurity - worry: Not on file  . Food insecurity - inability: Not on file  . Transportation needs - medical: Not on file  . Transportation needs - non-medical: Not on file  Occupational History  . Not on file  Tobacco Use  . Smoking status: Current Some Day Smoker    Packs/day: 0.50    Types: Cigarettes  . Smokeless tobacco: Never Used  Substance and Sexual Activity  . Alcohol use: No  . Drug use: Yes    Types: Marijuana  . Sexual activity: Yes    Birth control/protection: None  Other Topics Concern  . Not on file  Social History Narrative  . Not on file   Current Meds  Medication Sig  . FLUoxetine (PROZAC) 20 MG tablet Take 1 tablet (20 mg total) by mouth daily.  . [DISCONTINUED] cyclobenzaprine (FLEXERIL) 5 MG tablet Take 1 tablet (5 mg total) by mouth 3 (three) times daily as needed for muscle spasms.  . [DISCONTINUED] metroNIDAZOLE (FLAGYL) 500 MG tablet Take 1 tablet (500 mg total) by mouth 2 (two) times daily.   No Known Allergies    ROS: As per HPI, remainder of ROS negative.   OBJECTIVE:   Vitals:   05/20/17 1616  BP: 123/76    Pulse: 88  Resp: 16  Temp: 98.6 F (37 C)  TempSrc: Oral  SpO2: 99%     General appearance: alert; no distress Eyes: PERRL; EOMI; conjunctiva normal HENT: normocephalic; atraumatic; TMs normal, canal normal, external ears normal without trauma; nasal mucosa normal; oral mucosa normal Neck: supple Lungs: clear to auscultation bilaterally Heart: regular rate and rhythm Abdomen: soft, non-tender; bowel sounds normal; no masses or organomegaly; no guarding or rebound tenderness Back: no CVA tenderness Extremities: no cyanosis or edema; symmetrical with no gross deformities Skin: warm and dry Neurologic: normal gait; grossly normal Psychological: alert and cooperative; normal mood and affect      Labs:  Results for orders placed or performed during the hospital encounter of 05/20/17  POCT urinalysis dip (device)  Result Value Ref Range   Glucose, UA NEGATIVE NEGATIVE mg/dL   Bilirubin Urine SMALL (A) NEGATIVE   Ketones, ur NEGATIVE NEGATIVE mg/dL   Specific Gravity, Urine >=1.030 1.005 - 1.030   Hgb urine dipstick NEGATIVE NEGATIVE   pH 6.0 5.0 - 8.0   Protein, ur NEGATIVE NEGATIVE mg/dL   Urobilinogen, UA 1.0 0.0 - 1.0 mg/dL   Nitrite NEGATIVE NEGATIVE   Leukocytes, UA SMALL (A) NEGATIVE  Pregnancy, urine POC  Result Value Ref Range   Preg Test,  Ur NEGATIVE NEGATIVE    Labs Reviewed  POCT URINALYSIS DIP (DEVICE) - Abnormal; Notable for the following components:      Result Value   Bilirubin Urine SMALL (*)    Leukocytes, UA SMALL (*)    All other components within normal limits  URINE CULTURE  POCT PREGNANCY, URINE  URINE CYTOLOGY ANCILLARY ONLY    No results found.     ASSESSMENT & PLAN:  1. Acute cystitis without hematuria     Meds ordered this encounter  Medications  . cephALEXin (KEFLEX) 500 MG capsule    Sig: Take 1 capsule (500 mg total) 3 (three) times daily by mouth.    Dispense:  20 capsule    Refill:  0    Reviewed expectations re:  course of current medical issues. Questions answered. Outlined signs and symptoms indicating need for more acute intervention. Patient verbalized understanding. After Visit Summary given.    Procedures:      Elvina SidleLauenstein, Kynadie Yaun, MD 05/20/17 1640

## 2017-05-20 NOTE — Discharge Instructions (Signed)
Have your partner take the azithromycin to prevent reinfection

## 2017-05-20 NOTE — ED Triage Notes (Signed)
[facility], pt reports burning during urination with a yellowish discharge. Pt had BV 2 weeks ago and was taking an antibiotic and has completed

## 2017-05-21 LAB — URINE CYTOLOGY ANCILLARY ONLY
Chlamydia: NEGATIVE
Neisseria Gonorrhea: NEGATIVE
Trichomonas: NEGATIVE

## 2017-05-22 LAB — URINE CULTURE

## 2017-05-23 LAB — URINE CYTOLOGY ANCILLARY ONLY
Bacterial vaginitis: POSITIVE — AB
Candida vaginitis: NEGATIVE

## 2017-05-26 ENCOUNTER — Telehealth (HOSPITAL_COMMUNITY): Payer: Self-pay | Admitting: Emergency Medicine

## 2017-05-26 MED ORDER — METRONIDAZOLE 500 MG PO TABS: 500.0000 mg | ORAL_TABLET | Freq: Two times a day (BID) | ORAL | 0 refills | Status: DC

## 2017-05-26 NOTE — Telephone Encounter (Signed)
-----   Message from Elvina SidleKurt Lauenstein, MD sent at 05/22/2017  9:08 AM EST ----- Please inform patient of normal result

## 2017-05-26 NOTE — Telephone Encounter (Signed)
Called pt to notify of recent lab results.... Pt ID'd properly Reports feeling better and sx are subsiding but still having yellowish vaginal d/c Pt is taking/tolarating well meds given at visit.  Pt requests for Flagyl to be sent to [facility] ([address]) Adv pt if sx are not getting better to return or to f/u w/PCP Education on safe sex given Notified pt that lab results can be obtained through MyChart Pt verb understanding.

## 2017-06-22 ENCOUNTER — Encounter (HOSPITAL_COMMUNITY): Payer: Self-pay | Admitting: Emergency Medicine

## 2017-06-22 ENCOUNTER — Emergency Department (HOSPITAL_COMMUNITY)
Admission: EM | Admit: 2017-06-22 | Discharge: 2017-06-23 | Disposition: A | Discharge status: Home / Self Care | Payer: Medicaid Other | Attending: Emergency Medicine | Admitting: Emergency Medicine

## 2017-06-22 ENCOUNTER — Other Ambulatory Visit: Payer: Self-pay

## 2017-06-22 DIAGNOSIS — N72 Inflammatory disease of cervix uteri: Secondary | ICD-10-CM

## 2017-06-22 DIAGNOSIS — F17210 Nicotine dependence, cigarettes, uncomplicated: Secondary | ICD-10-CM | POA: Insufficient documentation

## 2017-06-22 DIAGNOSIS — B373 Candidiasis of vulva and vagina: Secondary | ICD-10-CM | POA: Insufficient documentation

## 2017-06-22 DIAGNOSIS — B3731 Acute candidiasis of vulva and vagina: Secondary | ICD-10-CM

## 2017-06-22 DIAGNOSIS — N760 Acute vaginitis: Secondary | ICD-10-CM | POA: Insufficient documentation

## 2017-06-22 DIAGNOSIS — B9689 Other specified bacterial agents as the cause of diseases classified elsewhere: Secondary | ICD-10-CM

## 2017-06-22 LAB — WET PREP, GENITAL
SPERM: NONE SEEN
Trich, Wet Prep: NONE SEEN

## 2017-06-22 MED ORDER — METRONIDAZOLE 500 MG PO TABS
500.0000 mg | ORAL_TABLET | Freq: Once | ORAL | Status: AC
  Administered 2017-06-22: 500 mg via ORAL
  Filled 2017-06-22: qty 1

## 2017-06-22 MED ORDER — LIDOCAINE HCL (PF) 1 % IJ SOLN
INTRAMUSCULAR | Status: AC
  Filled 2017-06-22: qty 5

## 2017-06-22 MED ORDER — STERILE WATER FOR INJECTION IJ SOLN
INTRAMUSCULAR | Status: AC
  Administered 2017-06-22: 2 mL
  Filled 2017-06-22: qty 10

## 2017-06-22 MED ORDER — AZITHROMYCIN 250 MG PO TABS
1000.0000 mg | ORAL_TABLET | Freq: Once | ORAL | Status: AC
  Administered 2017-06-22: 1000 mg via ORAL
  Filled 2017-06-22: qty 4

## 2017-06-22 MED ORDER — CEFTRIAXONE SODIUM 250 MG IJ SOLR
250.0000 mg | Freq: Once | INTRAMUSCULAR | Status: AC
  Administered 2017-06-22: 250 mg via INTRAMUSCULAR
  Filled 2017-06-22: qty 250

## 2017-06-22 NOTE — ED Provider Notes (Signed)
[facility]-EMERGENCY DEPT Provider Note   CSN: [id number] Arrival date & time: 06/22/17  2004     History   Chief Complaint Chief Complaint  Patient presents with  . Vaginal Discharge    HPI Sabrina Rasmussen is a 26 y.o. female.  Patient presents to the emergency department with a chief complaint of vaginal discharge.  She reports recurrent bacterial vaginosis.  She states that she has had the symptoms for about a week, and states that her discharge smells like fish.  He denies any fever, chills, nausea, or vomiting.  Denies any abdominal pain.  Denies any vaginal bleeding.  She denies any other associated symptoms.   The history is provided by the patient. No language interpreter was used.    Past Medical History:  Diagnosis Date  . Anxiety   . Panic attack   . Pregnant state, incidental     Patient Active Problem List   Diagnosis Date Noted  . Major depressive disorder with single episode 03/17/2017  . GAD (generalized anxiety disorder) 03/17/2017    Past Surgical History:  Procedure Laterality Date  . ABSCESS DRAINAGE    . CESAREAN SECTION      OB History    Gravida Para Term Preterm AB Living   2 1 1   1 1    SAB TAB Ectopic Multiple Live Births   1       1       Home Medications    Prior to Admission medications   Medication Sig Start Date End Date Taking? Authorizing Provider  FLUoxetine (PROZAC) 20 MG tablet Take 1 tablet (20 mg total) by mouth daily. 03/19/17  Yes Massie MaroonHollis, Lachina M, FNP  hydrOXYzine (ATARAX/VISTARIL) 10 MG tablet Take 1 tablet (10 mg total) by mouth 3 (three) times daily as needed for anxiety. 05/04/17  Yes Massie MaroonHollis, Lachina M, FNP  cephALEXin (KEFLEX) 500 MG capsule Take 1 capsule (500 mg total) 3 (three) times daily by mouth. Patient not taking: Reported on 06/22/2017 05/20/17   Elvina SidleLauenstein, Kurt, MD  metroNIDAZOLE (FLAGYL) 500 MG tablet Take 1 tablet (500 mg total) by mouth 2 (two) times daily. Patient not  taking: Reported on 06/22/2017 05/26/17   Isa RankinMurray, Laura Wilson, MD  oxyCODONE (OXY IR/ROXICODONE) 5 MG immediate release tablet Take 10 mg by mouth every 6 (six) hours as needed. for pain 04/12/17   [provider]    Family History History reviewed. No pertinent family history.  Social History Social History   Tobacco Use  . Smoking status: Current Some Day Smoker    Packs/day: 0.50    Types: Cigarettes  . Smokeless tobacco: Never Used  Substance Use Topics  . Alcohol use: No  . Drug use: Yes    Types: Marijuana     Allergies   Patient has no known allergies.   Review of Systems Review of Systems  All other systems reviewed and are negative.    Physical Exam Updated Vital Signs BP 120/85 (BP Location: Left Arm)   Pulse 65   Temp 98.3 F (36.8 C) (Oral)   Resp 16   Ht 5\' 3"  (1.6 m)   Wt 54.3 kg (119 lb 9.6 oz)   LMP 06/09/2017   SpO2 100%   BMI 21.19 kg/m   Physical Exam  Constitutional: She is oriented to person, place, and time. She appears well-developed and well-nourished.  HENT:  Head: Normocephalic and atraumatic.  Eyes: Conjunctivae and EOM are normal. Pupils are equal, round, and  reactive to light.  Neck: Normal range of motion. Neck supple.  Cardiovascular: Normal rate and regular rhythm. Exam reveals no gallop and no friction rub.  No murmur heard. Pulmonary/Chest: Effort normal and breath sounds normal. No respiratory distress. She has no wheezes. She has no rales. She exhibits no tenderness.  Abdominal: Soft. Bowel sounds are normal. She exhibits no distension and no mass. There is no tenderness. There is no rebound and no guarding.  Genitourinary:  Genitourinary Comments: Pelvic exam refused  Musculoskeletal: Normal range of motion. She exhibits no edema or tenderness.  Neurological: She is alert and oriented to person, place, and time.  Skin: Skin is warm and dry.  Psychiatric: She has a normal mood and affect. Her behavior is  normal. Judgment and thought content normal.  Nursing note and vitals reviewed.    ED Treatments / Results  Labs (all labs ordered are listed, but only abnormal results are displayed) Labs Reviewed  WET PREP, GENITAL  GC/CHLAMYDIA PROBE AMP ([place]) NOT AT [facility]    EKG  EKG Interpretation None       Radiology No results found.  Procedures Procedures (including critical care time)  Medications Ordered in ED Medications  cefTRIAXone (ROCEPHIN) injection 250 mg (not administered)  azithromycin (ZITHROMAX) tablet 1,000 mg (not administered)  metroNIDAZOLE (FLAGYL) tablet 500 mg (not administered)     Initial Impression / Assessment and Plan / ED Course  I have reviewed the triage vital signs and the nursing notes.  Pertinent labs & imaging results that were available during my care of the patient were reviewed by me and considered in my medical decision making (see chart for details).     Patient with vaginal discharge.  Reports recurrent BV.  Refuses pelvic, but asks if she can swab herself.  Discussed that this may not be as accurate, but will allow patient to blind swab.  Will treat.  DC to home with OBGYN follow-up.  Final Clinical Impressions(s) / ED Diagnoses   Final diagnoses:  BV (bacterial vaginosis)  Yeast vaginitis  Cervicitis    ED Discharge Orders        Ordered    metroNIDAZOLE (FLAGYL) 500 MG tablet  2 times daily     06/23/17 0014    fluconazole (DIFLUCAN) 150 MG tablet     06/23/17 0014       Roxy HorsemanBrowning, Jasin Brazel, PA-C 06/23/17 0014    Vanetta MuldersZackowski, Scott, MD 06/25/17 [phone]

## 2017-06-22 NOTE — ED Triage Notes (Signed)
Patient states that she is having a discharge that smells like fish. Patient states that she has a hx of BV.

## 2017-06-23 MED ORDER — METRONIDAZOLE 500 MG PO TABS: 500.0000 mg | ORAL_TABLET | Freq: Two times a day (BID) | ORAL | 0 refills | Status: DC

## 2017-06-23 MED ORDER — FLUCONAZOLE 150 MG PO TABS: ORAL_TABLET | ORAL | 0 refills | Status: DC

## 2017-06-25 LAB — GC/CHLAMYDIA PROBE AMP (~~LOC~~) NOT AT ARMC
Chlamydia: NEGATIVE
Neisseria Gonorrhea: NEGATIVE

## 2017-08-03 ENCOUNTER — Telehealth: Payer: Self-pay

## 2017-08-03 NOTE — Telephone Encounter (Signed)
Pt called to request a fax copy of appointment for next week to be sent to her employer; pt notified that access to her appointment information is located in her My Chart profile; pt verbalizes understanding

## 2017-08-06 ENCOUNTER — Encounter: Payer: Self-pay | Admitting: Family Medicine

## 2017-08-06 ENCOUNTER — Ambulatory Visit (INDEPENDENT_AMBULATORY_CARE_PROVIDER_SITE_OTHER): Payer: Self-pay | Admitting: Family Medicine

## 2017-08-06 VITALS — BP 115/70 | HR 90 | Temp 98.5°F | Resp 16 | Ht 63.0 in | Wt 124.0 lb

## 2017-08-06 DIAGNOSIS — F411 Generalized anxiety disorder: Secondary | ICD-10-CM

## 2017-08-06 DIAGNOSIS — F3289 Other specified depressive episodes: Secondary | ICD-10-CM

## 2017-08-06 DIAGNOSIS — R1084 Generalized abdominal pain: Secondary | ICD-10-CM

## 2017-08-06 DIAGNOSIS — R109 Unspecified abdominal pain: Secondary | ICD-10-CM

## 2017-08-06 DIAGNOSIS — G4700 Insomnia, unspecified: Secondary | ICD-10-CM

## 2017-08-06 LAB — POCT URINALYSIS DIP (DEVICE)
Bilirubin Urine: NEGATIVE
Glucose, UA: NEGATIVE mg/dL
Hgb urine dipstick: NEGATIVE
KETONES UR: NEGATIVE mg/dL
Leukocytes, UA: NEGATIVE
Nitrite: NEGATIVE
PH: 6 (ref 5.0–8.0)
PROTEIN: NEGATIVE mg/dL
SPECIFIC GRAVITY, URINE: 1.025 (ref 1.005–1.030)
Urobilinogen, UA: 0.2 mg/dL (ref 0.0–1.0)

## 2017-08-06 LAB — POCT URINE PREGNANCY: Preg Test, Ur: NEGATIVE

## 2017-08-06 MED ORDER — FLUOXETINE HCL 20 MG PO TABS: 40.0000 mg | ORAL_TABLET | Freq: Every day | ORAL | 5 refills | Status: DC

## 2017-08-06 MED ORDER — TRAZODONE HCL 50 MG PO TABS: 25.0000 mg | ORAL_TABLET | Freq: Every evening | ORAL | 3 refills | Status: DC | PRN

## 2017-08-06 MED ORDER — IBUPROFEN 600 MG PO TABS: 600.0000 mg | ORAL_TABLET | Freq: Three times a day (TID) | ORAL | 0 refills | Status: DC | PRN

## 2017-08-06 NOTE — Patient Instructions (Addendum)
For anxiety, will increase fluoxetine to 40 mg daily. I have added trazodone 25 mg at bedtime as needed for insomnia. Practice good sleep hygiene.  Stick to a sleep schedule, even on weekends. Exercise is great, but not too late in the day Avoid alcoholic drinks before bed Avoid large meals and beverages late before bed Don't take naps after 3 pm. Keep power naps less than 1 hour.  Relax before bed.  Take a hot bath before bed.  Have a good sleeping environment. Get rid of anything in your bedroom that might distract you from sleep.  Adopt good sleeping posture.   Reviewed urine pregnancy, test negative.  We will follow-up by phone with any abnormal laboratory results related to abdominal pain.  Recommend a low-fat diet divided over small meals throughout the day.

## 2017-08-06 NOTE — Progress Notes (Signed)
Subjective:     Sabrina Rasmussen is a 27 y.o. female who presents for follow up of anxiety disorder. Sabrina Rasmussen was started on fluoxetine 20 mg daily and Atarax 10 mg 3 times per day as needed for generalized anxiety.  Patient states that medication regimen has not been effective.  She has had a difficult time controlling her worrying. Current symptoms also include: difficulty concentrating, feelings of losing control, irritable, racing thoughts. She denies current suicidal and homicidal ideation.  Patient also states that she has been unable to fall asleep.  It is been taking greater than 1 hour to fall asleep.  She says that sleep is fragmented throughout the night she awakens frequently.  Patient is also complaining of generalized abdominal pain over the past 3 days.  Her diet mostly consists of fast and processed food.  She says that today's meal consisted of canned sausages, banana and soda.  Her last menstrual cycle was on July 03, 2017.  Patient also endorses breast tenderness.  She is sexually active without barrier protection and is not on birth control.  Patient describes abdominal pain as cramping and intermittent.  Current pain intensity is 3-4/10.  She is not attempted any over-the-counter analgesics to alleviate current symptoms.  Past Medical History:  Diagnosis Date  . Anxiety   . Panic attack   . Pregnant state, incidental    Social History   Socioeconomic History  . Marital status: Single    Spouse name: Not on file  . Number of children: Not on file  . Years of education: Not on file  . Highest education level: Not on file  Social Needs  . Financial resource strain: Not on file  . Food insecurity - worry: Not on file  . Food insecurity - inability: Not on file  . Transportation needs - medical: Not on file  . Transportation needs - non-medical: Not on file  Occupational History  . Not on file  Tobacco Use  . Smoking status: Current Some Day Smoker    Packs/day: 0.50     Types: Cigarettes  . Smokeless tobacco: Never Used  Substance and Sexual Activity  . Alcohol use: No  . Drug use: Yes    Types: Marijuana  . Sexual activity: Yes    Birth control/protection: None  Other Topics Concern  . Not on file  Social History Narrative  . Not on file   No Known Allergies  GAD 7 : Generalized Anxiety Score 08/06/2017 03/19/2017 02/14/2017  Nervous, Anxious, on Edge 3 3 3   Control/stop worrying 2 2 1   Worry too much - different things 3 2 2   Trouble relaxing 2 2 3   Restless 2 2 3   Easily annoyed or irritable 3 3 3   Afraid - awful might happen 2 2 3   Total GAD 7 Score 17 16 18   Anxiety Difficulty Somewhat difficult Extremely difficult Very difficult    Review of Systems  Constitutional: Negative.   Eyes: Negative.   Respiratory: Negative.   Cardiovascular: Negative.   Gastrointestinal: Negative.   Genitourinary: Negative.   Musculoskeletal: Negative.   Skin: Negative.   Neurological: Negative.   Endo/Heme/Allergies: Negative.   Psychiatric/Behavioral: Positive for depression. The patient is nervous/anxious and has insomnia.      Objective:    BP 115/70 (BP Location: Left Arm, Patient Position: Sitting, Cuff Size: Normal)   Pulse 90   Temp 98.5 F (36.9 C) (Oral)   Resp 16   Ht 5\' 3"  (1.6 m)  Wt 124 lb (56.2 kg)   LMP 07/03/2017   SpO2 100%   BMI 21.97 kg/m    General:  alert and mild distress  Affect/Behavior:  normal perception, normal speech pattern and content and normal thought patterns agitation and flattened affect     Physical Exam  Neck: Normal range of motion. Neck supple.  Cardiovascular: Normal rate and regular rhythm.  Pulmonary/Chest: Effort normal and breath sounds normal.  Abdominal: There is tenderness.  Neurological: She is alert.  Skin: Skin is warm and dry.  Psychiatric: Mood, memory and judgment normal. Her affect is not labile. She is agitated. She expresses no homicidal and no suicidal ideation. She does not  have a flat affect.  Speech pressured   Assessment:    Anxiety Disorder - Course worsening  Plan:  1. GAD (generalized anxiety disorder) We will increase fluoxetine to 40 mg daily.  Patient warrants a referral to psychiatry for further workup and evaluation. She currently denies suicidal or homicidal ideations. - FLUoxetine (PROZAC) 20 MG tablet; Take 2 tablets (40 mg total) by mouth daily.  Dispense: 60 tablet; Refill: 5 - Ambulatory referral to Psychiatry  2. Generalized abdominal pain Reviewed urine pregnancy, test is negative.  Patient describes abdominal pain is cramping and endorses breast tenderness.  Both are symptoms of premenstrual syndrome, I suspect the patient is ready to start menstrual cycle.  Reviewed urinalysis, no signs of urinary tract infection. - POCT urine pregnancy - CBC - Basic Metabolic Panel  3. Other depression - FLUoxetine (PROZAC) 20 MG tablet; Take 2 tablets (40 mg total) by mouth daily.  Dispense: 60 tablet; Refill: 5 - Ambulatory referral to Psychiatry  4. Abdominal cramping - ibuprofen (ADVIL,MOTRIN) 600 MG tablet; Take 1 tablet (600 mg total) by mouth every 8 (eight) hours as needed.  Dispense: 30 tablet; Refill: 0  5. Insomnia, unspecified type Discussed practicing good sleep hygiene at length.  Will start a trial of trazodone 25 mg at bedtime. - traZODone (DESYREL) 50 MG tablet; Take 0.5 tablets (25 mg total) by mouth at bedtime as needed for sleep.  Dispense: 30 tablet; Refill: 3   RTC: Patient will follow-up in 1 month for depression and anxiety.    Nolon Nations  MSN, FNP-C Patient Care [facility] [address] [place], [place] [address] [phone]

## 2017-08-07 LAB — BASIC METABOLIC PANEL
BUN / CREAT RATIO: 14 (ref 9–23)
BUN: 12 mg/dL (ref 6–20)
CALCIUM: 9.1 mg/dL (ref 8.7–10.2)
CHLORIDE: 104 mmol/L (ref 96–106)
CO2: 23 mmol/L (ref 20–29)
Creatinine, Ser: 0.88 mg/dL (ref 0.57–1.00)
GFR calc non Af Amer: 90 mL/min/{1.73_m2} (ref >59)
GFR, EST AFRICAN AMERICAN: 103 mL/min/{1.73_m2} (ref >59)
Glucose: 71 mg/dL (ref 65–99)
Potassium: 3.8 mmol/L (ref 3.5–5.2)
Sodium: 140 mmol/L (ref 134–144)

## 2017-08-07 LAB — CBC
HEMATOCRIT: 35.9 % (ref 34.0–46.6)
Hemoglobin: 12.1 g/dL (ref 11.1–15.9)
MCH: 31.6 pg (ref 26.6–33.0)
MCHC: 33.7 g/dL (ref 31.5–35.7)
MCV: 94 fL (ref 79–97)
PLATELETS: 243 10*3/uL (ref 150–379)
RBC: 3.83 x10E6/uL (ref 3.77–5.28)
RDW: 14.1 % (ref 12.3–15.4)
WBC: 6.7 10*3/uL (ref 3.4–10.8)

## 2017-08-11 ENCOUNTER — Inpatient Hospital Stay (HOSPITAL_COMMUNITY): Payer: Self-pay

## 2017-08-11 ENCOUNTER — Encounter (HOSPITAL_COMMUNITY): Payer: Self-pay | Admitting: *Deleted

## 2017-08-11 ENCOUNTER — Inpatient Hospital Stay (HOSPITAL_COMMUNITY)
Admission: AD | Admit: 2017-08-11 | Discharge: 2017-08-11 | Disposition: A | Discharge status: Home / Self Care | Payer: Self-pay | Source: Ambulatory Visit | Attending: Obstetrics and Gynecology | Admitting: Obstetrics and Gynecology

## 2017-08-11 DIAGNOSIS — F410 Panic disorder [episodic paroxysmal anxiety] without agoraphobia: Secondary | ICD-10-CM | POA: Insufficient documentation

## 2017-08-11 DIAGNOSIS — O209 Hemorrhage in early pregnancy, unspecified: Secondary | ICD-10-CM

## 2017-08-11 DIAGNOSIS — F329 Major depressive disorder, single episode, unspecified: Secondary | ICD-10-CM | POA: Insufficient documentation

## 2017-08-11 DIAGNOSIS — F17210 Nicotine dependence, cigarettes, uncomplicated: Secondary | ICD-10-CM | POA: Insufficient documentation

## 2017-08-11 DIAGNOSIS — R109 Unspecified abdominal pain: Secondary | ICD-10-CM

## 2017-08-11 DIAGNOSIS — R1030 Lower abdominal pain, unspecified: Secondary | ICD-10-CM

## 2017-08-11 DIAGNOSIS — Z9889 Other specified postprocedural states: Secondary | ICD-10-CM | POA: Insufficient documentation

## 2017-08-11 DIAGNOSIS — O26891 Other specified pregnancy related conditions, first trimester: Secondary | ICD-10-CM

## 2017-08-11 HISTORY — DX: Depression, unspecified: F32.A

## 2017-08-11 HISTORY — DX: Major depressive disorder, single episode, unspecified: F32.9

## 2017-08-11 LAB — WET PREP, GENITAL
CLUE CELLS WET PREP: NONE SEEN
TRICH WET PREP: NONE SEEN
Yeast Wet Prep HPF POC: NONE SEEN

## 2017-08-11 LAB — URINALYSIS, ROUTINE W REFLEX MICROSCOPIC
BILIRUBIN URINE: NEGATIVE
GLUCOSE, UA: NEGATIVE mg/dL
HGB URINE DIPSTICK: NEGATIVE
Ketones, ur: NEGATIVE mg/dL
Leukocytes, UA: NEGATIVE
Nitrite: NEGATIVE
Protein, ur: NEGATIVE mg/dL
SPECIFIC GRAVITY, URINE: 1.006 (ref 1.005–1.030)
pH: 7 (ref 5.0–8.0)

## 2017-08-11 LAB — POCT PREGNANCY, URINE: PREG TEST UR: NEGATIVE

## 2017-08-11 LAB — HCG, QUANTITATIVE, PREGNANCY: hCG, Beta Chain, Quant, S: 8 m[IU]/mL — ABNORMAL HIGH (ref <5)

## 2017-08-11 NOTE — MAU Note (Signed)
+  hpt X 3.  Has been cramping, started having pink d/c yesterday, became heavy and brown today.  Pt very anxious and tearful. Had a miscarriage last year, the baby would be due today.

## 2017-08-11 NOTE — MAU Provider Note (Signed)
History   [id number] very early pregnant states 3 pos at home. States sex last night and woke up this morning with cramping and bleeding.  CSN: [id number]  Arrival date & time 08/11/17  1145   None     Chief Complaint  Patient presents with  . Vaginal Bleeding  . Abdominal Pain  . Possible Pregnancy    HPI  Past Medical History:  Diagnosis Date  . Anxiety   . Depression   . Panic attack   . Pregnant state, incidental     Past Surgical History:  Procedure Laterality Date  . ABSCESS DRAINAGE    . CESAREAN SECTION      History reviewed. No pertinent family history.  Social History   Tobacco Use  . Smoking status: Current Some Day Smoker    Packs/day: 0.50    Types: Cigarettes  . Smokeless tobacco: Never Used  Substance Use Topics  . Alcohol use: No  . Drug use: Yes    Types: Marijuana    OB History    Gravida Para Term Preterm AB Living   2 1 1   1 1    SAB TAB Ectopic Multiple Live Births   1       1      Review of Systems  Constitutional: Negative.   HENT: Negative.   Eyes: Negative.   Respiratory: Negative.   Cardiovascular: Negative.   Gastrointestinal: Positive for abdominal pain.  Endocrine: Negative.   Genitourinary: Positive for vaginal bleeding.  Musculoskeletal: Negative.   Skin: Negative.   Allergic/Immunologic: Negative.   Neurological: Negative.   Hematological: Negative.   Psychiatric/Behavioral: Negative.     Allergies  Patient has no known allergies.  Home Medications    BP 122/72 (BP Location: Right Arm)   Pulse (!) 102   Temp 97.7 F (36.5 C) (Oral)   Resp 16   Wt 120 lb 12 oz (54.8 kg)   LMP 07/11/2017   BMI 21.39 kg/m   Physical Exam  Constitutional: She is oriented to person, place, and time. She appears well-developed and well-nourished.  HENT:  Head: Normocephalic.  Eyes: Pupils are equal, round, and reactive to light.  Neck: Normal range of motion.  Cardiovascular: Normal rate, normal heart sounds and intact  distal pulses.  Pulmonary/Chest: Effort normal and breath sounds normal.  Abdominal: Soft. Bowel sounds are normal.  Genitourinary: Vagina normal and uterus normal.  Musculoskeletal: Normal range of motion.  Neurological: She is alert and oriented to person, place, and time. She has normal reflexes.  Skin: Skin is warm and dry.  Psychiatric: She has a normal mood and affect. Her behavior is normal. Judgment and thought content normal.    MAU Course  Procedures (including critical care time)  Labs Reviewed  WET PREP, GENITAL - Abnormal; Notable for the following components:      Result Value   WBC, Wet Prep HPF POC FEW (*)    All other components within normal limits  HCG, QUANTITATIVE, PREGNANCY - Abnormal; Notable for the following components:   hCG, Beta Chain, Quant, S 8 (*)    All other components within normal limits  URINALYSIS, ROUTINE W REFLEX MICROSCOPIC  POCT PREGNANCY, URINE  GC/CHLAMYDIA PROBE AMP ([place]) NOT AT [facility]   No results found.   1. Lower abdominal pain       MDM  VSS, Scant amt dark vag bleeding, wet prep neg, cultures pending. UPT neg but serum quant 8. US non conclusive for pregnant but normal  ovaries per [place]. Pain is improved will d/c home to repeat quant Monday.

## 2017-08-11 NOTE — Discharge Instructions (Signed)
Abdominal Pain During Pregnancy Abdominal pain is common in pregnancy. Most of the time, it does not cause harm. There are many causes of abdominal pain. Some causes are more serious than others and sometimes the cause is not known. Abdominal pain can be a sign that something is very wrong with the pregnancy or the pain may have nothing to do with the pregnancy. Always tell your health care provider if you have any abdominal pain. Follow these instructions at home:  Do not have sex or put anything in your vagina until your symptoms go away completely.  Watch your abdominal pain for any changes.  Get plenty of rest until your pain improves.  Drink enough fluid to keep your urine clear or pale yellow.  Take over-the-counter or prescription medicines only as told by your health care provider.  Keep all follow-up visits as told by your health care provider. This is important. Contact a health care provider if:  You have a fever.  Your pain gets worse or you have cramping.  Your pain continues after resting. Get help right away if:  You are bleeding, leaking fluid, or passing tissue from the vagina.  You have vomiting or diarrhea that does not go away.  You have painful or bloody urination.  You notice a decrease in your baby's movements.  You feel very weak or faint.  You have shortness of breath.  You develop a severe headache with abdominal pain.  You have abnormal vaginal discharge with abdominal pain. This information is not intended to replace advice given to you by your health care provider. Make sure you discuss any questions you have with your health care provider. Document Released: 06/19/2005 Document Revised: 03/30/2016 Document Reviewed: 01/16/2013 Elsevier Interactive Patient Education  2018 [employer]  Threatened Miscarriage A threatened miscarriage is when you have vaginal bleeding during your first 20 weeks of pregnancy but the pregnancy has not ended. Your  doctor will do tests to make sure you are still pregnant. The cause of the bleeding may not be known. This condition does not mean your pregnancy will end. It does increase the risk of it ending (complete miscarriage). Follow these instructions at home:  Make sure you keep all your doctor visits for prenatal care.  Get plenty of rest.  Do not have sex or use tampons if you have vaginal bleeding.  Do not douche.  Do not smoke or use drugs.  Do not drink alcohol.  Avoid caffeine. Contact a doctor if:  You have light bleeding from your vagina.  You have belly pain or cramping.  You have a fever. Get help right away if:  You have heavy bleeding from your vagina.  You have clots of blood coming from your vagina.  You have bad pain or cramps in your low back or belly.  You have fever, chills, and bad belly pain. This information is not intended to replace advice given to you by your health care provider. Make sure you discuss any questions you have with your health care provider. Document Released: 06/01/2008 Document Revised: 11/25/2015 Document Reviewed: 04/15/2013 Elsevier Interactive Patient Education  [employer]2018 Elsevier Inc.

## 2017-08-13 ENCOUNTER — Ambulatory Visit: Payer: Medicaid Other | Admitting: General Practice

## 2017-08-13 DIAGNOSIS — O3680X0 Pregnancy with inconclusive fetal viability, not applicable or unspecified: Secondary | ICD-10-CM

## 2017-08-13 LAB — GC/CHLAMYDIA PROBE AMP (~~LOC~~) NOT AT ARMC
Chlamydia: NEGATIVE
Neisseria Gonorrhea: NEGATIVE

## 2017-08-13 LAB — HCG, QUANTITATIVE, PREGNANCY: hCG, Beta Chain, Quant, S: 2 m[IU]/mL (ref <5)

## 2017-08-13 NOTE — Progress Notes (Signed)
Patient here for stat bhcg today. Patient reports mild cramping and bleeding, but she only sees the blood when she urinates. Discussed with patient we are monitoring her bhcg today and asked she wait in lobby for results/updated plan of care. Patient verbalized understanding & had no questions at this time.  Reviewed results with Dr Earlene Plateravis who finds decreasing bhcg levels, indicating SAB-  patient does not need further follow up.   Informed patient of results and no further follow up is needed. Patient verbalized understanding and had no questions

## 2017-08-13 NOTE — Progress Notes (Signed)
I have reviewed this chart and agree with the RN/CMA assessment and management.    K. Meryl Keionte Swicegood, M.D. Attending Obstetrician & Gynecologist, [facility], [facility]  

## 2017-08-24 ENCOUNTER — Ambulatory Visit (INDEPENDENT_AMBULATORY_CARE_PROVIDER_SITE_OTHER): Payer: Self-pay | Admitting: Family Medicine

## 2017-08-24 VITALS — BP 126/79 | HR 69 | Temp 98.4°F | Resp 16 | Ht 63.0 in | Wt 125.0 lb

## 2017-08-24 DIAGNOSIS — F17200 Nicotine dependence, unspecified, uncomplicated: Secondary | ICD-10-CM

## 2017-08-24 DIAGNOSIS — Z30011 Encounter for initial prescription of contraceptive pills: Secondary | ICD-10-CM

## 2017-08-24 DIAGNOSIS — R109 Unspecified abdominal pain: Secondary | ICD-10-CM

## 2017-08-24 LAB — POCT URINE PREGNANCY: Preg Test, Ur: NEGATIVE

## 2017-08-24 NOTE — Progress Notes (Signed)
Subjective:    Chief Complaint  Patient presents with  . Abdominal Cramping    lower abdomen      Sabrina Rasmussen is a 27 y.o. female who presents complaining  abdominal cramping intermittently over the past several weeks. Patient was evaluated in the emergency department on 08/11/2017.  Prior to ER visit, patient had 3 positive home pregnancy tests. Patient woke up with abdominal cramping and vaginal bleeding.  Quantitative beta HCG was 8 on arrival and repeated 3 days later, which was 2. Dr. Earlene Plateravis, obstetrician determined that patient had spontaneous abortion. Reviewed pelvic and transvaginal ultrasound, no intrauterine gestation evident. Patient says that she continues to have periodic cramping. She is requesting oral contraceptives on today. She has tried depo provera in the past. She denies headache, fever, fatigue, dysuria, weight loss, or night sweats.   Past Medical History:  Diagnosis Date  . Anxiety   . Depression   . Panic attack   . Pregnant state, incidental    Social History   Socioeconomic History  . Marital status: Married    Spouse name: Not on file  . Number of children: Not on file  . Years of education: Not on file  . Highest education level: Not on file  Social Needs  . Financial resource strain: Not on file  . Food insecurity - worry: Not on file  . Food insecurity - inability: Not on file  . Transportation needs - medical: Not on file  . Transportation needs - non-medical: Not on file  Occupational History  . Not on file  Tobacco Use  . Smoking status: Current Some Day Smoker    Packs/day: 0.50    Types: Cigarettes  . Smokeless tobacco: Never Used  Substance and Sexual Activity  . Alcohol use: No  . Drug use: Yes    Types: Marijuana  . Sexual activity: Yes    Birth control/protection: None  Other Topics Concern  . Not on file  Social History Narrative  . Not on file   No Known Allergies  GAD 7 : Generalized Anxiety Score 08/06/2017 03/19/2017  02/14/2017  Nervous, Anxious, on Edge 3 3 3   Control/stop worrying 2 2 1   Worry too much - different things 3 2 2   Trouble relaxing 2 2 3   Restless 2 2 3   Easily annoyed or irritable 3 3 3   Afraid - awful might happen 2 2 3   Total GAD 7 Score 17 16 18   Anxiety Difficulty Somewhat difficult Extremely difficult Very difficult    Review of Systems  Constitutional: Negative.   Eyes: Negative.   Respiratory: Negative.   Cardiovascular: Negative.   Gastrointestinal: Positive for abdominal pain (periodic cramping).  Genitourinary: Negative.   Musculoskeletal: Negative.   Skin: Negative.   Neurological: Negative.   Endo/Heme/Allergies: Negative.      Objective:        Physical Exam  Eyes: Pupils are equal, round, and reactive to light.  Neck: Normal range of motion. Neck supple.  Cardiovascular: Normal rate and regular rhythm.  Pulmonary/Chest: Effort normal and breath sounds normal.  Abdominal: There is tenderness.  Neurological: She is alert.  Skin: Skin is warm and dry.  Psychiatric: Mood, memory and judgment normal. Her affect is not labile. She expresses no homicidal and no suicidal ideation. She does not have a flat affect.   Assessment:     BP 126/79 (BP Location: Right Arm, Patient Position: Sitting, Cuff Size: Normal)   Pulse 69   Temp 98.4 F (  36.9 C) (Oral)   Resp 16   Ht 5\' 3"  (1.6 m)   Wt 125 lb (56.7 kg)   SpO2 100%   BMI 22.14 kg/m  Plan:  1. Encounter for oral contraception initial prescription Patient has experienced 2 miscarriages over the past year and is requesting oral contraception.  Discussed potential side effects of OC, including nausea, breast tenderness, mood swings, etc.  Also discussed the importance of smoking cessation.   - Norethindrone-Ethinyl Estradiol-Fe Biphas (LO LOESTRIN FE) 1 MG-10 MCG / 10 MCG tablet; Take 1 tablet by mouth daily.  Dispense: 1 Package; Refill: 2  2. Abdominal cramping Patient says that periodic abdominal  cramping is mild. Beta HCG and urine pregnancy were both negative.  Recommend Ibuprofen as directed for periodic abdominal cramping - POCT urine pregnancy  3. Tobacco dependence Smoking cessation instruction/counseling given:  counseled patient on the dangers of tobacco use, advised patient to stop smoking, and reviewed strategies to maximize success    RTC: 3 months for oral contraception   Nolon Nations  MSN, FNP-C [facility] [address] [place], [place] [address] [phone]

## 2017-08-24 NOTE — Patient Instructions (Signed)
Will start a trial of lo loestrin daily. Will follow up in 3 months.  Discussed the importance of smoking cessation at length.   Ethinyl Estradiol; Norethindrone Acetate; Ferrous fumarate tablets or capsules What is this medicine? ETHINYL ESTRADIOL; NORETHINDRONE ACETATE; FERROUS FUMARATE (ETH in il es tra DYE ole; nor eth IN drone AS e tate; FER [place] FUE ma rate) is an oral contraceptive. The products combine two types of female hormones, an estrogen and a progestin. They are used to prevent ovulation and pregnancy. Some products are also used to treat acne in females. This medicine may be used for other purposes; ask your health care provider or pharmacist if you have questions. COMMON BRAND NAME(S): Blisovi [address], Blisovi Fe, Estrostep Fe, Gildess [address] Gildess Fe 1.5/30, Gildess Fe 1/20, Junel Fe 1.5/30, Junel Fe 1/20, Junel Fe 24, Larin Fe, Lo Loestrin Fe, Loestrin 24 Fe, Loestrin FE 1.5/30, Loestrin FE 1/20, Lomedia 24 Fe, Microgestin 24 Fe, Microgestin Fe 1.5/30, Microgestin Fe 1/20, Tarina Fe 1/20, Taytulla, Tilia Fe, Tri-Legest Fe What should I tell my health care provider before I take this medicine? They need to know if you have any of these conditions: -abnormal vaginal bleeding -blood vessel disease -breast, cervical, endometrial, ovarian, liver, or uterine cancer -diabetes -gallbladder disease -heart disease or recent heart attack -high blood pressure -high cholesterol -history of blood clots -kidney disease -liver disease -migraine headaches -smoke tobacco -stroke -systemic lupus erythematosus (SLE) -an unusual or allergic reaction to estrogens, progestins, other medicines, foods, dyes, or preservatives -pregnant or trying to get pregnant -breast-feeding How should I use this medicine? Take this medicine by mouth. To reduce nausea, this medicine may be taken with food. Follow the directions on the prescription label. Take this medicine at the same time each day and in the  order directed on the package. Do not take your medicine more often than directed. A patient package insert for the product will be given with each prescription and refill. Read this sheet carefully each time. The sheet may change frequently. Contact your pediatrician regarding the use of this medicine in children. Special care may be needed. This medicine has been used in female children who have started having menstrual periods. Overdosage: If you think you have taken too much of this medicine contact a poison control center or emergency room at once. NOTE: This medicine is only for you. Do not share this medicine with others. What if I miss a dose? If you miss a dose, refer to the patient information sheet you received with your medicine for direction. If you miss more than one pill, this medicine may not be as effective and you may need to use another form of birth control. What may interact with this medicine? Do not take this medicine with the following medication: -dasabuvir; ombitasvir; paritaprevir; ritonavir -ombitasvir; paritaprevir; ritonavir This medicine may also interact with the following medications: -acetaminophen -antibiotics or medicines for infections, especially rifampin, rifabutin, rifapentine, and griseofulvin, and possibly penicillins or tetracyclines -aprepitant -ascorbic acid (vitamin C) -atorvastatin -barbiturate medicines, such as phenobarbital -bosentan -carbamazepine -caffeine -clofibrate -cyclosporine -dantrolene -doxercalciferol -felbamate -grapefruit juice -hydrocortisone -medicines for anxiety or sleeping problems, such as diazepam or temazepam -medicines for diabetes, including pioglitazone -mineral oil -modafinil -mycophenolate -nefazodone -oxcarbazepine -phenytoin -prednisolone -ritonavir or other medicines for HIV infection or AIDS -rosuvastatin -selegiline -soy isoflavones supplements -St. John's wort -tamoxifen or  raloxifene -theophylline -thyroid hormones -topiramate -warfarin This list may not describe all possible interactions. Give your health care provider a list  of all the medicines, herbs, non-prescription drugs, or dietary supplements you use. Also tell them if you smoke, drink alcohol, or use illegal drugs. Some items may interact with your medicine. What should I watch for while using this medicine? Visit your doctor or health care professional for regular checks on your progress. You will need a regular breast and pelvic exam and Pap smear while on this medicine. Use an additional method of contraception during the first cycle that you take these tablets. If you have any reason to think you are pregnant, stop taking this medicine right away and contact your doctor or health care professional. If you are taking this medicine for hormone related problems, it may take several cycles of use to see improvement in your condition. Smoking increases the risk of getting a blood clot or having a stroke while you are taking birth control pills, especially if you are more than 29 years old. You are strongly advised not to smoke. This medicine can make your body retain fluid, making your fingers, hands, or ankles swell. Your blood pressure can go up. Contact your doctor or health care professional if you feel you are retaining fluid. This medicine can make you more sensitive to the sun. Keep out of the sun. If you cannot avoid being in the sun, wear protective clothing and use sunscreen. Do not use sun lamps or tanning beds/booths. If you wear contact lenses and notice visual changes, or if the lenses begin to feel uncomfortable, consult your eye care specialist. In some women, tenderness, swelling, or minor bleeding of the gums may occur. Notify your dentist if this happens. Brushing and flossing your teeth regularly may help limit this. See your dentist regularly and inform your dentist of the medicines you are  taking. If you are going to have elective surgery, you may need to stop taking this medicine before the surgery. Consult your health care professional for advice. This medicine does not protect you against HIV infection (AIDS) or any other sexually transmitted diseases. What side effects may I notice from receiving this medicine? Side effects that you should report to your doctor or health care professional as soon as possible: -allergic reactions like skin rash, itching or hives, swelling of the face, lips, or tongue -breast tissue changes or discharge -changes in vaginal bleeding during your period or between your periods -changes in vision -chest pain -confusion -coughing up blood -dizziness -feeling faint or lightheaded -headaches or migraines -leg, arm or groin pain -loss of balance or coordination -severe or sudden headaches -stomach pain (severe) -sudden shortness of breath -sudden numbness or weakness of the face, arm or leg -symptoms of vaginal infection like itching, irritation or unusual discharge -tenderness in the upper abdomen -trouble speaking or understanding -vomiting -yellowing of the eyes or skin Side effects that usually do not require medical attention (report to your doctor or health care professional if they continue or are bothersome): -breakthrough bleeding and spotting that continues beyond the 3 initial cycles of pills -breast tenderness -mood changes, anxiety, depression, frustration, anger, or emotional outbursts -increased sensitivity to sun or ultraviolet light -nausea -skin rash, acne, or brown spots on the skin -weight gain (slight) This list may not describe all possible side effects. Call your doctor for medical advice about side effects. You may report side effects to FDA at 1-800-FDA-1088. Where should I keep my medicine? Keep out of the reach of children. Store at room temperature between 15 and 30 degrees C (59 and 86 degrees  F). Throw away  any unused medicine after the expiration date. NOTE: This sheet is a summary. It may not cover all possible information. If you have questions about this medicine, talk to your doctor, pharmacist, or health care provider.  2018 Elsevier/Gold Standard (2016-02-28 08:04:41)

## 2017-08-30 ENCOUNTER — Encounter: Payer: Self-pay | Admitting: Family Medicine

## 2017-08-30 MED ORDER — NORETHIN-ETH ESTRAD-FE BIPHAS 1 MG-10 MCG / 10 MCG PO TABS: 1.0000 | ORAL_TABLET | Freq: Every day | ORAL | 2 refills | Status: DC

## 2017-09-03 ENCOUNTER — Ambulatory Visit: Payer: Self-pay | Admitting: Family Medicine

## 2017-11-21 ENCOUNTER — Encounter: Payer: Self-pay | Admitting: Family Medicine

## 2017-11-21 ENCOUNTER — Ambulatory Visit (INDEPENDENT_AMBULATORY_CARE_PROVIDER_SITE_OTHER): Payer: Self-pay | Admitting: Family Medicine

## 2017-11-21 VITALS — BP 124/80 | HR 79 | Temp 99.2°F | Resp 16 | Ht 63.0 in | Wt 118.0 lb

## 2017-11-21 DIAGNOSIS — G4700 Insomnia, unspecified: Secondary | ICD-10-CM

## 2017-11-21 DIAGNOSIS — Z30011 Encounter for initial prescription of contraceptive pills: Secondary | ICD-10-CM

## 2017-11-21 DIAGNOSIS — F411 Generalized anxiety disorder: Secondary | ICD-10-CM

## 2017-11-21 DIAGNOSIS — N912 Amenorrhea, unspecified: Secondary | ICD-10-CM

## 2017-11-21 LAB — POCT URINE PREGNANCY: Preg Test, Ur: NEGATIVE

## 2017-11-21 MED ORDER — NORETHIN-ETH ESTRAD-FE BIPHAS 1 MG-10 MCG / 10 MCG PO TABS: 1.0000 | ORAL_TABLET | Freq: Every day | ORAL | 11 refills | Status: DC

## 2017-11-21 MED ORDER — TRAZODONE HCL 50 MG PO TABS
25.0000 mg | ORAL_TABLET | Freq: Every evening | ORAL | 1 refills | Status: DC | PRN
Start: 2017-11-21 — End: 2018-01-14

## 2017-11-21 MED ORDER — FLUOXETINE HCL 20 MG PO TABS: 20.0000 mg | ORAL_TABLET | Freq: Every day | ORAL | 3 refills | Status: DC

## 2017-11-21 NOTE — Progress Notes (Signed)
Subjective:     Sabrina Rasmussen is a 27 y.o. female who presents for follow up of anxiety disorder. [name] was started on fluoxetine 20 mg daily and Atarax 10 mg 3 times per day as needed for generalized anxiety.  Patient states that she is not been taking medications consistently.  She continues to have increased stressors at work and home.   She has had a difficult time controlling her worrying. Current symptoms also include: difficulty concentrating, feelings of losing control, irritable, racing thoughts. She denies current suicidal and homicidal ideation.  Patient also states that she has been unable to fall asleep.  It is been taking greater than 1 hour to fall asleep.  She says that sleep is fragmented throughout the night she awakens frequently.    Past Medical History:  Diagnosis Date  . Anxiety   . Depression   . Panic attack   . Pregnant state, incidental    Social History   Socioeconomic History  . Marital status: Married    Spouse name: Not on file  . Number of children: Not on file  . Years of education: Not on file  . Highest education level: Not on file  Occupational History  . Not on file  Social Needs  . Financial resource strain: Not on file  . Food insecurity:    Worry: Not on file    Inability: Not on file  . Transportation needs:    Medical: Not on file    Non-medical: Not on file  Tobacco Use  . Smoking status: Current Some Day Smoker    Packs/day: 0.50    Types: Cigarettes  . Smokeless tobacco: Never Used  Substance and Sexual Activity  . Alcohol use: No  . Drug use: Yes    Types: Marijuana  . Sexual activity: Yes    Birth control/protection: None  Lifestyle  . Physical activity:    Days per week: Not on file    Minutes per session: Not on file  . Stress: Not on file  Relationships  . Social connections:    Talks on phone: Not on file    Gets together: Not on file    Attends religious service: Not on file    Active member of club or  organization: Not on file    Attends meetings of clubs or organizations: Not on file    Relationship status: Not on file  . Intimate partner violence:    Fear of current or ex partner: Not on file    Emotionally abused: Not on file    Physically abused: Not on file    Forced sexual activity: Not on file  Other Topics Concern  . Not on file  Social History Narrative  . Not on file   No Known Allergies  GAD 7 : Generalized Anxiety Score 11/21/2017 08/06/2017 03/19/2017 02/14/2017  Nervous, Anxious, on Edge Control/stop worrying Worry too much - different things Trouble relaxing Restless Easily annoyed or irritable Afraid - awful might happen Total GAD 7 Score Anxiety Difficulty Somewhat difficult Somewhat difficult Extremely difficult Very difficult    Review of Systems  Constitutional: Negative.   Eyes: Negative.   Respiratory: Negative.   Cardiovascular: Negative.   Gastrointestinal: Negative.   Genitourinary: Negative.  Musculoskeletal: Negative.   Skin: Negative.   Neurological: Negative.   Endo/Heme/Allergies: Negative.   Psychiatric/Behavioral: Positive for depression. The patient is nervous/anxious and has insomnia.      Objective:    BP 124/80 (BP Location: Left Arm, Patient Position: Sitting, Cuff Size: Normal)   Pulse 79   Temp 99.2 F (37.3 C) (Oral)   Resp 16   Ht  (1.6 m)   Wt 118 lb (53.5 kg)   LMP 10/13/2017   SpO2 100%   BMI 20.90 kg/m    General:  alert and mild distress  Affect/Behavior:  normal perception, normal speech pattern and content and normal thought patterns agitation and flattened affect     Physical Exam  Neck: Normal range of motion. Neck supple.  Cardiovascular: Normal rate and regular rhythm.  Pulmonary/Chest: Effort normal and breath sounds normal.  Neurological: She is alert.  Skin: Skin is warm and dry.  Psychiatric: Mood, memory and judgment  normal. Her affect is not labile. She is agitated. She expresses no homicidal and no suicidal ideation. She does not have a flat affect.  Speech pressured   Assessment:    Anxiety Disorder - Course worsening  Plan:  1. Amenorrhea - POCT urine pregnancy  2. GAD (generalized anxiety disorder) GAD 7 : Generalized Anxiety Score 11/21/2017 08/06/2017 03/19/2017 02/14/2017  Nervous, Anxious, on Edge Control/stop worrying Worry too much - different things Trouble relaxing Restless Easily annoyed or irritable Afraid - awful might happen Total GAD 7 Score Anxiety Difficulty Somewhat difficult Somewhat difficult Extremely difficult Very difficult    - FLUoxetine (PROZAC) 20 MG tablet; Take 1 tablet (20 mg total) by mouth daily.  Dispense: 30 tablet; Refill: 3 - Ambulatory referral to Psychiatry  3. Insomnia, unspecified type - traZODone (DESYREL) 50 MG tablet; Take 0.5 tablets (25 mg total) by mouth at bedtime as needed for sleep.  Dispense: 30 tablet; Refill: 1  4. Encounter for oral contraception initial prescription - Norethindrone-Ethinyl Estradiol-Fe Biphas (LO LOESTRIN FE) 1 MG-10 MCG / 10 MCG tablet; Take 1 tablet by mouth daily.  Dispense: 1 Package; Refill: 11   RTC: 3 months for chronic conditions    Nolon Nations  MSN, FNP-C Patient Care [facility] [address] [place], [place] [address] [phone]

## 2017-11-21 NOTE — Patient Instructions (Signed)
We will continue fluoxetine 20 mg daily for generalized anxiety disorder.  For insomnia we will start a trial of trazodone 25 mg at bedtime.  Please refrain from drinking, driving, or operating machinery while taking this medication.  I have also sent a referral to psychiatry for further work-up and evaluation of generalized anxiety disorder. Practice good sleep hygiene.  Stick to a sleep schedule, even on weekends. Exercise is great, but not too late in the day Avoid alcoholic drinks before bed Avoid large meals and beverages late before bed Don't take naps after 3 pm. Keep power naps less than 1 hour.  Relax before bed.  Take a hot bath before bed.  Have a good sleeping environment. Get rid of anything in your bedroom that might distract you from sleep.  Adopt good sleeping posture.   Your urine pregnancy test was negative, will restart your oral contraceptive.  Return in 3 months for follow-up.

## 2017-12-08 ENCOUNTER — Other Ambulatory Visit: Payer: Self-pay | Admitting: Family Medicine

## 2017-12-08 MED ORDER — DROSPIRENONE-ETHINYL ESTRADIOL 3-0.02 MG PO TABS: 1.0000 | ORAL_TABLET | Freq: Every day | ORAL | 11 refills | Status: DC

## 2017-12-08 NOTE — Progress Notes (Signed)
Meds ordered this encounter  Medications  . drospirenone-ethinyl estradiol (YAZ,GIANVI,LORYNA) 3-0.02 MG tablet    Sig: Take 1 tablet by mouth daily.    Dispense:  1 Package    Refill:  11    Sabrina NationsLachina Moore Andrianna Manalang  MSN, FNP-C Patient [facility] [address]  [place], [place] [id number] [phone]

## 2017-12-10 NOTE — Progress Notes (Signed)
Called, and spoke with patient. She is aware. Thanks!

## 2017-12-23 ENCOUNTER — Inpatient Hospital Stay (HOSPITAL_COMMUNITY): Payer: BLUE CROSS/BLUE SHIELD

## 2017-12-23 ENCOUNTER — Encounter (HOSPITAL_COMMUNITY): Payer: Self-pay | Admitting: Student

## 2017-12-23 ENCOUNTER — Inpatient Hospital Stay (HOSPITAL_COMMUNITY)
Admission: AD | Admit: 2017-12-23 | Discharge: 2017-12-23 | Disposition: A | Discharge status: Home / Self Care | Payer: BLUE CROSS/BLUE SHIELD | Source: Ambulatory Visit | Attending: Obstetrics and Gynecology | Admitting: Obstetrics and Gynecology

## 2017-12-23 DIAGNOSIS — R109 Unspecified abdominal pain: Secondary | ICD-10-CM | POA: Insufficient documentation

## 2017-12-23 DIAGNOSIS — Z3A01 Less than 8 weeks gestation of pregnancy: Secondary | ICD-10-CM | POA: Insufficient documentation

## 2017-12-23 DIAGNOSIS — O26891 Other specified pregnancy related conditions, first trimester: Secondary | ICD-10-CM | POA: Insufficient documentation

## 2017-12-23 DIAGNOSIS — O99342 Other mental disorders complicating pregnancy, second trimester: Secondary | ICD-10-CM | POA: Diagnosis not present

## 2017-12-23 DIAGNOSIS — F329 Major depressive disorder, single episode, unspecified: Secondary | ICD-10-CM | POA: Diagnosis not present

## 2017-12-23 DIAGNOSIS — O283 Abnormal ultrasonic finding on antenatal screening of mother: Secondary | ICD-10-CM

## 2017-12-23 DIAGNOSIS — O99332 Smoking (tobacco) complicating pregnancy, second trimester: Secondary | ICD-10-CM | POA: Insufficient documentation

## 2017-12-23 DIAGNOSIS — O3680X0 Pregnancy with inconclusive fetal viability, not applicable or unspecified: Secondary | ICD-10-CM

## 2017-12-23 DIAGNOSIS — F17210 Nicotine dependence, cigarettes, uncomplicated: Secondary | ICD-10-CM | POA: Insufficient documentation

## 2017-12-23 LAB — URINALYSIS, ROUTINE W REFLEX MICROSCOPIC
Bilirubin Urine: NEGATIVE
GLUCOSE, UA: NEGATIVE mg/dL
Hgb urine dipstick: NEGATIVE
KETONES UR: NEGATIVE mg/dL
LEUKOCYTES UA: NEGATIVE
Nitrite: NEGATIVE
PH: 6 (ref 5.0–8.0)
Protein, ur: NEGATIVE mg/dL
SPECIFIC GRAVITY, URINE: 1.012 (ref 1.005–1.030)

## 2017-12-23 LAB — CBC
HEMATOCRIT: 38.4 % (ref 36.0–46.0)
HEMOGLOBIN: 13.3 g/dL (ref 12.0–15.0)
MCH: 32.3 pg (ref 26.0–34.0)
MCHC: 34.6 g/dL (ref 30.0–36.0)
MCV: 93.2 fL (ref 78.0–100.0)
Platelets: 243 10*3/uL (ref 150–400)
RBC: 4.12 MIL/uL (ref 3.87–5.11)
RDW: 14 % (ref 11.5–15.5)
WBC: 6.4 10*3/uL (ref 4.0–10.5)

## 2017-12-23 LAB — WET PREP, GENITAL
CLUE CELLS WET PREP: NONE SEEN
Sperm: NONE SEEN
TRICH WET PREP: NONE SEEN
YEAST WET PREP: NONE SEEN

## 2017-12-23 LAB — POCT PREGNANCY, URINE
PREG TEST UR: NEGATIVE
Preg Test, Ur: POSITIVE — AB

## 2017-12-23 LAB — HCG, QUANTITATIVE, PREGNANCY: HCG, BETA CHAIN, QUANT, S: 87 m[IU]/mL — AB (ref <5)

## 2017-12-23 NOTE — MAU Note (Signed)
Pos HPT on 06/19, LMP 11/22/2017  Abdominal pain for the past 4 days. No bleeding, some thick discharge, think she might have a yeast infection.   Says urine is green/yellow.

## 2017-12-23 NOTE — MAU Provider Note (Signed)
History     CSN: [id number]  Arrival date and time: 12/23/17 0741   First Provider Initiated Contact with Patient 12/23/17 0813      Chief Complaint  Patient presents with  . Abdominal Pain  . Vaginal Discharge   HPI  Sabrina Rasmussen is a 27 y.o. G4P1021 at [redacted]w[redacted]d by LMP who presents with abdominal pain and vaginal discharge. Reports intermittent lower abdominal pain that is sharp and cramp-like. Pain started 4 days ago. Has not treated pain. Nothing makes better or worse. Rates pain 5/10.  Also has noticed "cottage cheese"-like discharge. Denies itching or irritation. No vaginal bleeding.  Has noticed that her urine has be bright green/yellow since she started taking prenatal vitamins. No other urinary complaints.   OB History    Gravida  4   Para  1   Term  1   Preterm      AB  2   Living  1     SAB  2   TAB      Ectopic      Multiple      Live Births  1           Past Medical History:  Diagnosis Date  . Anxiety   . Depression   . Panic attack     Past Surgical History:  Procedure Laterality Date  . ABSCESS DRAINAGE    . CESAREAN SECTION      History reviewed. No pertinent family history.  Social History   Tobacco Use  . Smoking status: Current Some Day Smoker    Packs/day: 0.25    Types: Cigarettes  . Smokeless tobacco: Never Used  . Tobacco comment: 2/day  Substance Use Topics  . Alcohol use: No  . Drug use: Yes    Types: Marijuana    Comment: daily    Allergies: No Known Allergies  Medications Prior to Admission  Medication Sig Dispense Refill Last Dose  . drospirenone-ethinyl estradiol (YAZ,GIANVI,LORYNA) 3-0.02 MG tablet Take 1 tablet by mouth daily. 1 Package 11   . FLUoxetine (PROZAC) 20 MG tablet Take 1 tablet (20 mg total) by mouth daily. 30 tablet 3   . Prenatal Vit-Fe Fumarate-FA (PRENATAL MULTIVITAMIN) TABS tablet Take 1 tablet by mouth daily at 12 noon.   Not Taking  . traZODone (DESYREL) 50 MG tablet Take 0.5 tablets  (25 mg total) by mouth at bedtime as needed for sleep. 30 tablet 1     Review of Systems  Constitutional: Negative.   Gastrointestinal: Positive for abdominal pain.  Genitourinary: Positive for vaginal discharge. Negative for dysuria and vaginal bleeding.   Physical Exam   Blood pressure 118/73, pulse (!) 103, temperature 98.3 F (36.8 C), temperature source Oral, resp. rate 18, height 5\' 3"  (1.6 m), weight 117 lb 12 oz (53.4 kg), last menstrual period 11/22/2017, unknown if currently breastfeeding.  Physical Exam  Nursing note and vitals reviewed. Constitutional: She is oriented to person, place, and time. She appears well-developed and well-nourished. No distress.  HENT:  Head: Normocephalic and atraumatic.  Eyes: Conjunctivae are normal. Right eye exhibits no discharge. Left eye exhibits no discharge. No scleral icterus.  Neck: Normal range of motion.  Respiratory: Effort normal. No respiratory distress.  GI: Soft. Bowel sounds are normal. She exhibits no distension. There is no tenderness. There is no rebound and no guarding.  Neurological: She is alert and oriented to person, place, and time.  Skin: Skin is warm and dry. She is not diaphoretic.  Psychiatric: She has a normal mood and affect. Her behavior is normal. Judgment and thought content normal.    MAU Course  Procedures Results for orders placed or performed during the hospital encounter of 12/23/17 (from the past 24 hour(s))  Urinalysis, Routine w reflex microscopic     Status: Abnormal   Collection Time: 12/23/17  7:56 AM  Result Value Ref Range   Color, Urine YELLOW YELLOW   APPearance HAZY (A) CLEAR   Specific Gravity, Urine 1.012 1.005 - 1.030   pH 6.0 5.0 - 8.0   Glucose, UA NEGATIVE NEGATIVE mg/dL   Hgb urine dipstick NEGATIVE NEGATIVE   Bilirubin Urine NEGATIVE NEGATIVE   Ketones, ur NEGATIVE NEGATIVE mg/dL   Protein, ur NEGATIVE NEGATIVE mg/dL   Nitrite NEGATIVE NEGATIVE   Leukocytes, UA NEGATIVE  NEGATIVE  Pregnancy, urine POC     Status: None   Collection Time: 12/23/17  8:00 AM  Result Value Ref Range   Preg Test, Ur NEGATIVE NEGATIVE  Pregnancy, urine POC     Status: Abnormal   Collection Time: 12/23/17  8:01 AM  Result Value Ref Range   Preg Test, Ur POSITIVE (A) NEGATIVE  CBC     Status: None   Collection Time: 12/23/17  8:21 AM  Result Value Ref Range   WBC 6.4 4.0 - 10.5 K/uL   RBC 4.12 3.87 - 5.11 MIL/uL   Hemoglobin 13.3 12.0 - 15.0 g/dL   HCT 13.238.4 44.036.0 - 10.246.0 %   MCV 93.2 78.0 - 100.0 fL   MCH 32.3 26.0 - 34.0 pg   MCHC 34.6 30.0 - 36.0 g/dL   RDW 72.514.0 36.611.5 - 44.015.5 %   Platelets 243 150 - 400 K/uL  hCG, quantitative, pregnancy     Status: Abnormal   Collection Time: 12/23/17  8:21 AM  Result Value Ref Range   hCG, Beta Chain, Quant, S 87 (H) <5 mIU/mL  Wet prep, genital     Status: Abnormal   Collection Time: 12/23/17  8:31 AM  Result Value Ref Range   Yeast Wet Prep HPF POC NONE SEEN NONE SEEN   Trich, Wet Prep NONE SEEN NONE SEEN   Clue Cells Wet Prep HPF POC NONE SEEN NONE SEEN   WBC, Wet Prep HPF POC FEW (A) NONE SEEN   Sperm NONE SEEN    [place] Ob Less Than 14 Weeks With Ob Transvaginal  Result Date: 12/23/2017 CLINICAL DATA:  Pelvic pain. Patient is [redacted] weeks pregnant based on her last menstrual period. EXAM: OBSTETRIC <14 WK US AND TRANSVAGINAL OB US TECHNIQUE: Both transabdominal and transvaginal ultrasound examinations were performed for complete evaluation of the gestation as well as the maternal uterus, adnexal regions, and pelvic cul-de-sac. Transvaginal technique was performed to assess early pregnancy. COMPARISON:  None. FINDINGS: Intrauterine gestational sac: None Yolk sac:  Not Visualized. Embryo:  Not Visualized. Maternal uterus/adnexae: No uterine masses. Cervix is closed. Endometrium is thickened to 19 mm. No ovarian or adnexal abnormality. Trace physiologic, pelvic free fluid. IMPRESSION: 1. No intrauterine gestational sac, yolk sac or embryo. The  endometrium is thickened. Findings may reflect an early intrauterine pregnancy, too early to visualize the gestational sac. Recommend follow-up ultrasound in 10-14 days and serial beta HCG levels to document normal pregnancy progression. 2. No acute findings. Normal ovaries and adnexa. No findings to account for pelvic pain. Electronically Signed   By: Amie Portlandavid  Ormond M.D.   On: 12/23/2017 09:34     MDM +UPT UA, wet prep, GC/chlamydia, CBC,  ABO/Rh, quant hCG, and [place] today to rule out ectopic pregnancy  U/a negative. Discussed with patient it is common to have brightly colored urine when taking vitamins.   Ultrasound shows no IUD or adnexal mass. HCG 87.   This abdominal pain could represent a normal pregnancy, spontaneous abortion, or even an ectopic pregnancy which can be life-threatening. Cultures were obtained to rule out pelvic infection.   Assessment and Plan  A:  1. Pregnancy of unknown anatomic location   2. Abdominal pain during pregnancy in first trimester    P: Discharge home Stat f/u hcg on Tuesday Discussed reasons to return to MAU including s/s ectopic pregnancy  Judeth Horn 12/23/2017, 8:13 AM

## 2017-12-23 NOTE — Discharge Instructions (Signed)
Return to care  °· If you have heavier bleeding that soaks through more that 2 pads per hour for an hour or more °· If you bleed so much that you feel like you might pass out or you do pass out °· If you have significant abdominal pain that is not improved with Tylenol  °· If you develop a fever > 100.5 ° °

## 2017-12-24 LAB — GC/CHLAMYDIA PROBE AMP (~~LOC~~) NOT AT ARMC
CHLAMYDIA, DNA PROBE: NEGATIVE
NEISSERIA GONORRHEA: NEGATIVE

## 2017-12-25 ENCOUNTER — Encounter: Payer: Self-pay | Admitting: Obstetrics & Gynecology

## 2017-12-25 ENCOUNTER — Ambulatory Visit (INDEPENDENT_AMBULATORY_CARE_PROVIDER_SITE_OTHER): Payer: BLUE CROSS/BLUE SHIELD | Admitting: General Practice

## 2017-12-25 ENCOUNTER — Encounter: Payer: Self-pay | Admitting: General Practice

## 2017-12-25 DIAGNOSIS — O283 Abnormal ultrasonic finding on antenatal screening of mother: Secondary | ICD-10-CM

## 2017-12-25 DIAGNOSIS — O3680X0 Pregnancy with inconclusive fetal viability, not applicable or unspecified: Secondary | ICD-10-CM

## 2017-12-25 LAB — HCG, QUANTITATIVE, PREGNANCY: hCG, Beta Chain, Quant, S: 234 m[IU]/mL — ABNORMAL HIGH (ref <5)

## 2017-12-25 NOTE — Progress Notes (Signed)
Patient presents to office today for stat bhcg. Patient reports some cramping this morning but it is less than MAU visit. Patient denies bleeding. Discussed with patient we are monitoring her bhcg levels and asked she wait in lobby for results/updated plan of care. Patient is very anxious concerning results given past history of two SABs. Patient has no questions at this time.

## 2017-12-25 NOTE — Progress Notes (Addendum)
Reviewed bhcg with Nolene Bernheimerri Burleson, NP and then with patient. Informed patient appropriate rise in bhcg and it appears to be early pregnancy. Recommend repeat US in 10 days (scheduled). Also advised to come to MAU severe pain, heavy bleeding. She voices understanding.    I have reviewed the 2 notes in clinic today with the lab results and previous ultrasound.  I agree with the plan and the documentation. Nolene BernheimERRI BURLESON, RN, MSN, NP-BC Nurse Practitioner, [facility], [facility] 12/25/2017 4:42 PM

## 2017-12-28 ENCOUNTER — Other Ambulatory Visit: Payer: Self-pay

## 2017-12-28 ENCOUNTER — Encounter (HOSPITAL_COMMUNITY): Payer: Self-pay | Admitting: *Deleted

## 2017-12-28 ENCOUNTER — Inpatient Hospital Stay (HOSPITAL_COMMUNITY)
Admission: AD | Admit: 2017-12-28 | Discharge: 2017-12-28 | Disposition: A | Discharge status: Home / Self Care | Payer: BLUE CROSS/BLUE SHIELD | Source: Ambulatory Visit | Attending: Family Medicine | Admitting: Family Medicine

## 2017-12-28 ENCOUNTER — Inpatient Hospital Stay (HOSPITAL_COMMUNITY): Payer: BLUE CROSS/BLUE SHIELD

## 2017-12-28 DIAGNOSIS — R109 Unspecified abdominal pain: Secondary | ICD-10-CM | POA: Diagnosis not present

## 2017-12-28 DIAGNOSIS — M62830 Muscle spasm of back: Secondary | ICD-10-CM | POA: Insufficient documentation

## 2017-12-28 DIAGNOSIS — O99331 Smoking (tobacco) complicating pregnancy, first trimester: Secondary | ICD-10-CM | POA: Insufficient documentation

## 2017-12-28 DIAGNOSIS — F17210 Nicotine dependence, cigarettes, uncomplicated: Secondary | ICD-10-CM | POA: Insufficient documentation

## 2017-12-28 DIAGNOSIS — O9989 Other specified diseases and conditions complicating pregnancy, childbirth and the puerperium: Secondary | ICD-10-CM | POA: Diagnosis not present

## 2017-12-28 DIAGNOSIS — M549 Dorsalgia, unspecified: Secondary | ICD-10-CM | POA: Diagnosis present

## 2017-12-28 DIAGNOSIS — O26893 Other specified pregnancy related conditions, third trimester: Secondary | ICD-10-CM

## 2017-12-28 DIAGNOSIS — Z3A01 Less than 8 weeks gestation of pregnancy: Secondary | ICD-10-CM | POA: Diagnosis not present

## 2017-12-28 DIAGNOSIS — O26899 Other specified pregnancy related conditions, unspecified trimester: Secondary | ICD-10-CM

## 2017-12-28 LAB — URINALYSIS, ROUTINE W REFLEX MICROSCOPIC
Bilirubin Urine: NEGATIVE
GLUCOSE, UA: NEGATIVE mg/dL
Hgb urine dipstick: NEGATIVE
Ketones, ur: NEGATIVE mg/dL
LEUKOCYTES UA: NEGATIVE
NITRITE: NEGATIVE
Protein, ur: NEGATIVE mg/dL
Specific Gravity, Urine: 1.006 (ref 1.005–1.030)
pH: 6 (ref 5.0–8.0)

## 2017-12-28 LAB — CBC WITH DIFFERENTIAL/PLATELET
BASOS ABS: 0 10*3/uL (ref 0.0–0.1)
Basophils Relative: 0 %
Eosinophils Absolute: 0 10*3/uL (ref 0.0–0.7)
Eosinophils Relative: 1 %
HCT: 35.5 % — ABNORMAL LOW (ref 36.0–46.0)
HEMOGLOBIN: 12.5 g/dL (ref 12.0–15.0)
LYMPHS PCT: 32 %
Lymphs Abs: 2.1 10*3/uL (ref 0.7–4.0)
MCH: 32.6 pg (ref 26.0–34.0)
MCHC: 35.2 g/dL (ref 30.0–36.0)
MCV: 92.7 fL (ref 78.0–100.0)
Monocytes Absolute: 0.3 10*3/uL (ref 0.1–1.0)
Monocytes Relative: 4 %
NEUTROS PCT: 63 %
Neutro Abs: 4.2 10*3/uL (ref 1.7–7.7)
Platelets: 278 10*3/uL (ref 150–400)
RBC: 3.83 MIL/uL — AB (ref 3.87–5.11)
RDW: 13.8 % (ref 11.5–15.5)
WBC: 6.6 10*3/uL (ref 4.0–10.5)

## 2017-12-28 LAB — HCG, QUANTITATIVE, PREGNANCY: HCG, BETA CHAIN, QUANT, S: 1640 m[IU]/mL — AB (ref <5)

## 2017-12-28 MED ORDER — CYCLOBENZAPRINE HCL 10 MG PO TABS: 10.0000 mg | ORAL_TABLET | Freq: Two times a day (BID) | ORAL | 0 refills | Status: DC | PRN

## 2017-12-28 MED ORDER — HYDROMORPHONE HCL 1 MG/ML IJ SOLN
1.0000 mg | Freq: Once | INTRAMUSCULAR | Status: AC
  Administered 2017-12-28: 1 mg via INTRAMUSCULAR
  Filled 2017-12-28: qty 1

## 2017-12-28 NOTE — MAU Note (Signed)
Been having back pain. Was to come back 7/3; told to come back early if having problems.  Can't stand up, it is a dull sharp pain.  Woke up with it this morning.  Went to work, took some Tylenol- no relief. Has gotten worse, sharp pains, coming around to her stomach. Pt unable to stand up fully. Had been constipated last few days,  Today has had real loose stools.

## 2017-12-28 NOTE — MAU Provider Note (Signed)
History     CSN: [id number]  Arrival date and time: 12/28/17 1705   First Provider Initiated Contact with Patient 12/28/17 1737      Chief Complaint  Patient presents with  . Back Pain   HPI Ms. Sabrina Rasmussen is a 27 y.o. [phone] at [redacted]w[redacted]d who presents to MAU today with complaint of severe back pain that started today. The patient was seen in MAU last week with abdominal pain. Pregnancy of unknown location was diagnosed. She had follow-up in the office after 48 hours and had appropriate rise in quant hCG at that time. She is scheduled for [place] next week for viability. She states pain in her back is severe. She tried Tylenol this morning when it started without relief. She denies worsening abdominal pain, vaginal bleeding, fever or UTI symptoms.   OB History    Gravida  4   Para  1   Term  1   Preterm      AB  2   Living  1     SAB  2   TAB      Ectopic      Multiple      Live Births  1           Past Medical History:  Diagnosis Date  . Anxiety   . Depression   . Panic attack     Past Surgical History:  Procedure Laterality Date  . ABSCESS DRAINAGE    . CESAREAN SECTION      History reviewed. No pertinent family history.  Social History   Tobacco Use  . Smoking status: Current Some Day Smoker    Packs/day: 0.25    Types: Cigarettes  . Smokeless tobacco: Never Used  . Tobacco comment: 2/day  Substance Use Topics  . Alcohol use: No  . Drug use: Yes    Types: Marijuana    Comment: daily    Allergies: No Known Allergies  No medications prior to admission.    Review of Systems  Constitutional: Negative for fever.  Gastrointestinal: Negative for abdominal pain, constipation, diarrhea, nausea and vomiting.  Genitourinary: Negative for dysuria, frequency, urgency, vaginal bleeding and vaginal discharge.  Musculoskeletal: Positive for back pain.   Physical Exam   Blood pressure 124/83, pulse 93, temperature 99.2 F (37.3 C), temperature  source Oral, resp. rate 20, weight 116 lb 4 oz (52.7 kg), last menstrual period 11/22/2017, SpO2 99 %, unknown if currently breastfeeding.  Physical Exam  Nursing note and vitals reviewed. Constitutional: She is oriented to person, place, and time. She appears well-developed and well-nourished. No distress.  HENT:  Head: Normocephalic and atraumatic.  Cardiovascular: Normal rate.  Respiratory: Effort normal.  GI: Soft. She exhibits no distension and no mass. There is no tenderness. There is no rebound, no guarding and no CVA tenderness.  Musculoskeletal:       Thoracic back: She exhibits tenderness. She exhibits no swelling, no edema, no pain and no spasm.       Lumbar back: She exhibits tenderness. She exhibits no bony tenderness, no swelling, no edema and no spasm.  Neurological: She is alert and oriented to person, place, and time.  Skin: Skin is warm and dry. No erythema.  Psychiatric: She has a normal mood and affect.     Results for orders placed or performed during the hospital encounter of 12/28/17 (from the past 24 hour(s))  CBC with Differential/Platelet     Status: Abnormal   Collection Time: 12/28/17  5:58 PM  Result Value Ref Range   WBC 6.6 4.0 - 10.5 K/uL   RBC 3.83 (L) 3.87 - 5.11 MIL/uL   Hemoglobin 12.5 12.0 - 15.0 g/dL   HCT 21.335.5 (L) 08.636.0 - 57.846.0 %   MCV 92.7 78.0 - 100.0 fL   MCH 32.6 26.0 - 34.0 pg   MCHC 35.2 30.0 - 36.0 g/dL   RDW 46.913.8 62.911.5 - 52.815.5 %   Platelets 278 150 - 400 K/uL   Neutrophils Relative % 63 %   Neutro Abs 4.2 1.7 - 7.7 K/uL   Lymphocytes Relative 32 %   Lymphs Abs 2.1 0.7 - 4.0 K/uL   Monocytes Relative 4 %   Monocytes Absolute 0.3 0.1 - 1.0 K/uL   Eosinophils Relative 1 %   Eosinophils Absolute 0.0 0.0 - 0.7 K/uL   Basophils Relative 0 %   Basophils Absolute 0.0 0.0 - 0.1 K/uL  hCG, quantitative, pregnancy     Status: Abnormal   Collection Time: 12/28/17  5:58 PM  Result Value Ref Range   hCG, Beta Chain, Quant, S 1,640 (H) <5  mIU/mL  Urinalysis, Routine w reflex microscopic     Status: Abnormal   Collection Time: 12/28/17  6:27 PM  Result Value Ref Range   Color, Urine STRAW (A) YELLOW   APPearance CLEAR CLEAR   Specific Gravity, Urine 1.006 1.005 - 1.030   pH 6.0 5.0 - 8.0   Glucose, UA NEGATIVE NEGATIVE mg/dL   Hgb urine dipstick NEGATIVE NEGATIVE   Bilirubin Urine NEGATIVE NEGATIVE   Ketones, ur NEGATIVE NEGATIVE mg/dL   Protein, ur NEGATIVE NEGATIVE mg/dL   Nitrite NEGATIVE NEGATIVE   Leukocytes, UA NEGATIVE NEGATIVE   [place] Ob Transvaginal  Result Date: 12/28/2017 CLINICAL DATA:  Pain.  Possible pregnancy. EXAM: TRANSVAGINAL OB ULTRASOUND TECHNIQUE: Transvaginal ultrasound was performed for complete evaluation of the gestation as well as the maternal uterus, adnexal regions, and pelvic cul-de-sac. COMPARISON:  December 23, 2017 FINDINGS: Intrauterine gestational sac: There is a small oval fluid collection within the thickened endometrium which was not seen December 23, 2017. Yolk sac:  Not Visualized. Embryo:  Not Visualized. MSD: 2.5 mm   4 w   6 d CRL:     mm    w  d                  US EDC: Subchorionic hemorrhage:  None visualized. Maternal uterus/adnexae: Corpus luteum cyst suspected on the right. Ovaries otherwise normal. IMPRESSION: 1. The endometrium is thickened. There is also a small oval fluid collection near the fundus in the thickened endometrium. If the patient is found to be pregnant, this would be nonspecific but most consistent with an early gestational sac. Probable early intrauterine gestational sac, but no yolk sac, fetal pole, or cardiac activity yet visualized. Recommend follow-up quantitative B-HCG levels and follow-up US in 14 days to assess viability. This recommendation follows SRU consensus guidelines: Diagnostic Criteria for Nonviable Pregnancy Early in the First Trimester. Malva Limes Engl J Med 2013; 413:2440-10; 369:1443-51. Electronically Signed   By: Gerome Samavid  Williams III M.D   On: 12/28/2017 18:56   [place] Ob Less Than  14 Weeks With Ob Transvaginal  Result Date: 12/23/2017 CLINICAL DATA:  Pelvic pain. Patient is [redacted] weeks pregnant based on her last menstrual period. EXAM: OBSTETRIC <14 WK US AND TRANSVAGINAL OB US TECHNIQUE: Both transabdominal and transvaginal ultrasound examinations were performed for complete evaluation of the gestation as well as the maternal uterus, adnexal regions, and  pelvic cul-de-sac. Transvaginal technique was performed to assess early pregnancy. COMPARISON:  None. FINDINGS: Intrauterine gestational sac: None Yolk sac:  Not Visualized. Embryo:  Not Visualized. Maternal uterus/adnexae: No uterine masses. Cervix is closed. Endometrium is thickened to 19 mm. No ovarian or adnexal abnormality. Trace physiologic, pelvic free fluid. IMPRESSION: 1. No intrauterine gestational sac, yolk sac or embryo. The endometrium is thickened. Findings may reflect an early intrauterine pregnancy, too early to visualize the gestational sac. Recommend follow-up ultrasound in 10-14 days and serial beta HCG levels to document normal pregnancy progression. 2. No acute findings. Normal ovaries and adnexa. No findings to account for pelvic pain. Electronically Signed   By: Amie Portland M.D.   On: 12/23/2017 09:34    MAU Course  Procedures None  MDM 1 mg IM Dilaudid given for pain CBC, hCG and [place] today  [place] today shows possible small IUGS, still pregnancy of unknown location however no evidence of ectopic pregnancy. Will have patient follow-up as scheduled for viability [place].  Assessment and Plan  A: Pregnancy of unknown location  Back spasm   P: Discharge home Rx for Flexeril given to patient  Ectopic precautions discussed Patient advised to follow-up with [facility] [place] as scheduled next week  Patient may return to MAU as needed or if her condition were to change or worsen  Vonzella Nipple, PA-C 12/28/2017, 8:01 PM

## 2018-01-02 ENCOUNTER — Ambulatory Visit (INDEPENDENT_AMBULATORY_CARE_PROVIDER_SITE_OTHER): Payer: BLUE CROSS/BLUE SHIELD | Admitting: *Deleted

## 2018-01-02 ENCOUNTER — Ambulatory Visit (HOSPITAL_COMMUNITY)
Admission: RE | Admit: 2018-01-02 | Discharge: 2018-01-02 | Disposition: A | Discharge status: Home / Self Care | Payer: BLUE CROSS/BLUE SHIELD | Source: Ambulatory Visit | Attending: Nurse Practitioner | Admitting: Nurse Practitioner

## 2018-01-02 DIAGNOSIS — Z3490 Encounter for supervision of normal pregnancy, unspecified, unspecified trimester: Secondary | ICD-10-CM

## 2018-01-02 DIAGNOSIS — O283 Abnormal ultrasonic finding on antenatal screening of mother: Secondary | ICD-10-CM | POA: Insufficient documentation

## 2018-01-02 DIAGNOSIS — Z712 Person consulting for explanation of examination or test findings: Secondary | ICD-10-CM

## 2018-01-02 DIAGNOSIS — O4690 Antepartum hemorrhage, unspecified, unspecified trimester: Secondary | ICD-10-CM | POA: Diagnosis not present

## 2018-01-02 DIAGNOSIS — O3680X1 Pregnancy with inconclusive fetal viability, fetus 1: Secondary | ICD-10-CM

## 2018-01-02 DIAGNOSIS — O3680X0 Pregnancy with inconclusive fetal viability, not applicable or unspecified: Secondary | ICD-10-CM

## 2018-01-02 NOTE — Progress Notes (Signed)
US results reviewed with Dr. Vergie LivingPickens - recommended repeat US in 11-14 days to assess pregnancy progression.  Pt denies abdominal pain or vaginal bleeding. Plan of care discussed. Pt voiced understanding. US scheduled on 7/17 @ 0800.  Pt advised to return to hospital if she develops severe abdominal/pelvic pain of heavy vaginal bleeding. She voiced understanding of all information and instructions given.

## 2018-01-04 NOTE — Progress Notes (Signed)
I have reviewed the chart and agree with nursing staff's documentation of this patient's encounter.  Elany Felix, MD 01/04/2018 9:23 AM    

## 2018-01-14 ENCOUNTER — Other Ambulatory Visit: Payer: Self-pay

## 2018-01-14 ENCOUNTER — Encounter (HOSPITAL_COMMUNITY): Payer: Self-pay

## 2018-01-14 ENCOUNTER — Inpatient Hospital Stay (HOSPITAL_COMMUNITY)
Admission: AD | Admit: 2018-01-14 | Discharge: 2018-01-14 | Disposition: A | Discharge status: Home / Self Care | Payer: BLUE CROSS/BLUE SHIELD | Source: Ambulatory Visit | Attending: Obstetrics and Gynecology | Admitting: Obstetrics and Gynecology

## 2018-01-14 DIAGNOSIS — O219 Vomiting of pregnancy, unspecified: Secondary | ICD-10-CM | POA: Diagnosis not present

## 2018-01-14 DIAGNOSIS — O210 Mild hyperemesis gravidarum: Secondary | ICD-10-CM | POA: Insufficient documentation

## 2018-01-14 DIAGNOSIS — F329 Major depressive disorder, single episode, unspecified: Secondary | ICD-10-CM | POA: Insufficient documentation

## 2018-01-14 DIAGNOSIS — O26891 Other specified pregnancy related conditions, first trimester: Secondary | ICD-10-CM | POA: Diagnosis not present

## 2018-01-14 DIAGNOSIS — R51 Headache: Secondary | ICD-10-CM | POA: Insufficient documentation

## 2018-01-14 DIAGNOSIS — O99331 Smoking (tobacco) complicating pregnancy, first trimester: Secondary | ICD-10-CM | POA: Insufficient documentation

## 2018-01-14 DIAGNOSIS — O99341 Other mental disorders complicating pregnancy, first trimester: Secondary | ICD-10-CM | POA: Insufficient documentation

## 2018-01-14 DIAGNOSIS — F410 Panic disorder [episodic paroxysmal anxiety] without agoraphobia: Secondary | ICD-10-CM | POA: Insufficient documentation

## 2018-01-14 DIAGNOSIS — Z3A01 Less than 8 weeks gestation of pregnancy: Secondary | ICD-10-CM | POA: Diagnosis not present

## 2018-01-14 DIAGNOSIS — Z79899 Other long term (current) drug therapy: Secondary | ICD-10-CM | POA: Insufficient documentation

## 2018-01-14 DIAGNOSIS — F17210 Nicotine dependence, cigarettes, uncomplicated: Secondary | ICD-10-CM | POA: Diagnosis not present

## 2018-01-14 LAB — URINALYSIS, ROUTINE W REFLEX MICROSCOPIC
Bilirubin Urine: NEGATIVE
Glucose, UA: NEGATIVE mg/dL
Hgb urine dipstick: NEGATIVE
Ketones, ur: NEGATIVE mg/dL
LEUKOCYTES UA: NEGATIVE
Nitrite: NEGATIVE
PROTEIN: NEGATIVE mg/dL
SPECIFIC GRAVITY, URINE: 1.006 (ref 1.005–1.030)
pH: 7 (ref 5.0–8.0)

## 2018-01-14 MED ORDER — PROMETHAZINE HCL 25 MG PO TABS
12.5000 mg | ORAL_TABLET | Freq: Once | ORAL | Status: AC
  Administered 2018-01-14: 12.5 mg via ORAL
  Filled 2018-01-14: qty 1

## 2018-01-14 MED ORDER — PROMETHAZINE HCL 12.5 MG PO TABS: 12.5000 mg | ORAL_TABLET | Freq: Three times a day (TID) | ORAL | 0 refills | Status: DC | PRN

## 2018-01-14 NOTE — MAU Note (Signed)
Pt presents with c/o N&V.  Reports unable to keep anything down.  Reports has vomited 7x in past 24 hours.

## 2018-01-14 NOTE — MAU Provider Note (Signed)
History     CSN: [id number]  Arrival date and time: 01/14/18 1844   None     Chief Complaint  Patient presents with  . Emesis  . Nausea  . Headache   HPI Sabrina Rasmussen is 27 y.o. [id number] [redacted]w[redacted]d weeks presenting with nausea and vomiting in first trimester pregnancy.  She is a patient in [employer].  Last seen 2 weeks ago.  States that nausea began 5 days ago.  Vomited 7 times today.  She is weak and doesn't have much energy.  Is able to drink but if eats too she vomits food and fluid.  Is able to keep Ginger ale down.  Unisom is not helping.  Had dark brown discharge 2 days ago and has seen an increase in mucous.  Denies vaginal bleeding.     Past Medical History:  Diagnosis Date  . Anxiety   . Depression   . Panic attack     Past Surgical History:  Procedure Laterality Date  . ABSCESS DRAINAGE    . CESAREAN SECTION      History reviewed. No pertinent family history.  Social History   Tobacco Use  . Smoking status: Current Some Day Smoker    Packs/day: 0.25    Types: Cigarettes  . Smokeless tobacco: Never Used  . Tobacco comment: 2/day  Substance Use Topics  . Alcohol use: No  . Drug use: Yes    Types: Marijuana    Comment: daily    Allergies: No Known Allergies  Medications Prior to Admission  Medication Sig Dispense Refill Last Dose  . acetaminophen (TYLENOL) 500 MG tablet Take 1,000 mg by mouth every 8 (eight) hours as needed for mild pain.   12/28/2017 at Unknown time  . cyclobenzaprine (FLEXERIL) 10 MG tablet Take 1 tablet (10 mg total) by mouth 2 (two) times daily as needed for muscle spasms. 20 tablet 0   . FLUoxetine (PROZAC) 20 MG tablet Take 1 tablet (20 mg total) by mouth daily. 30 tablet 3 Past Month at Unknown time  . Prenatal Vit-Fe Fumarate-FA (PRENATAL MULTIVITAMIN) TABS tablet Take 1 tablet by mouth daily at 12 noon.   12/28/2017 at Unknown time  . traZODone (DESYREL) 50 MG tablet Take 0.5 tablets (25 mg total) by mouth at bedtime as needed  for sleep. (Patient taking differently: Take 25 mg by mouth at bedtime. ) 30 tablet 1 12/27/2017 at Unknown time    Review of Systems  Constitutional: Positive for appetite change. Negative for fever.  Respiratory: Negative for shortness of breath.   Cardiovascular: Negative for chest pain.  Gastrointestinal: Positive for nausea and vomiting. Negative for abdominal pain.  Genitourinary: Positive for vaginal discharge (dark brown). Negative for dysuria.  Psychiatric/Behavioral: Negative for agitation and behavioral problems.   Physical Exam   Blood pressure 118/78, pulse (!) 103, temperature 98.4 F (36.9 C), temperature source Oral, height 5\' 2"  (1.575 m), weight 123 lb 4 oz (55.9 kg), last menstrual period 11/22/2017, SpO2 100 %, unknown if currently breastfeeding.  Physical Exam  Constitutional: She is oriented to person, place, and time. She appears well-developed and well-nourished. No distress.  HENT:  Head: Normocephalic.  Neck: Normal range of motion.  Cardiovascular: Normal rate.  Respiratory: Effort normal.  GI: Soft. She exhibits no distension and no mass. There is no tenderness. There is no rebound and no guarding.  Genitourinary: There is no rash, tenderness or lesion on the right labia. There is no rash, tenderness or lesion on the left  labia. Uterus is enlarged (7 week size). Uterus is not tender. Cervix exhibits no motion tenderness, no discharge and no friability. Right adnexum displays no mass, no tenderness and no fullness. Left adnexum displays no mass, no tenderness and no fullness. No bleeding in the vagina. Vaginal discharge (small amount creamy white discharge without odor.  Neg for brown color, neg for active bleeding) found.  Neurological: She is alert and oriented to person, place, and time.  Skin: Skin is warm and dry.  Psychiatric: She has a normal mood and affect. Her behavior is normal. Thought content normal.    Results for orders placed or performed during  the hospital encounter of 01/14/18 (from the past 24 hour(s))  Urinalysis, Routine w reflex microscopic     Status: Abnormal   Collection Time: 01/14/18  7:25 PM  Result Value Ref Range   Color, Urine STRAW (A) YELLOW   APPearance CLEAR CLEAR   Specific Gravity, Urine 1.006 1.005 - 1.030   pH 7.0 5.0 - 8.0   Glucose, UA NEGATIVE NEGATIVE mg/dL   Hgb urine dipstick NEGATIVE NEGATIVE   Bilirubin Urine NEGATIVE NEGATIVE   Ketones, ur NEGATIVE NEGATIVE mg/dL   Protein, ur NEGATIVE NEGATIVE mg/dL   Nitrite NEGATIVE NEGATIVE   Leukocytes, UA NEGATIVE NEGATIVE   MAU Course  Procedures  MDM MSE Labs Exam. Phenergan 12.5 given in MAU Rx for Phenergan for home use  Assessment and Plan  A: Nausea and vomiting in early pregnancy      3863w4d gestation  P:  Rx sent to pharmacy     Continue prenatal care.      Discussed adding Vit B6 with Unisom but not to take Unisom and Phenergan together. Dennison Mascotve M Jarmel Linhardt 01/14/2018, 8:34 PM

## 2018-01-14 NOTE — Discharge Instructions (Signed)
Morning Sickness Morning sickness is when you feel sick to your stomach (nauseous) during pregnancy. You may feel sick to your stomach and throw up (vomit). You may feel sick in the morning, but you can feel this way any time of day. Some women feel very sick to their stomach and cannot stop throwing up (hyperemesis gravidarum). Follow these instructions at home:  Only take medicines as told by your doctor.  Take multivitamins as told by your doctor. Taking multivitamins before getting pregnant can stop or lessen the harshness of morning sickness.  Eat dry toast or unsalted crackers before getting out of bed.  Eat 5 to 6 small meals a day.  Eat dry and bland foods like rice and baked potatoes.  Do not drink liquids with meals. Drink between meals.  Do not eat greasy, fatty, or spicy foods.  Have someone cook for you if the smell of food causes you to feel sick or throw up.  If you feel sick to your stomach after taking prenatal vitamins, take them at night or with a snack.  Eat protein when you need a snack (nuts, yogurt, cheese).  Eat unsweetened gelatins for dessert.  Wear a bracelet used for sea sickness (acupressure wristband).  Go to a doctor that puts thin needles into certain body points (acupuncture) to improve how you feel.  Do not smoke.  Use a humidifier to keep the air in your house free of odors.  Get lots of fresh air. Contact a doctor if:  You need medicine to feel better.  You feel dizzy or lightheaded.  You are losing weight. Get help right away if:  You feel very sick to your stomach and cannot stop throwing up.  You pass out (faint). This information is not intended to replace advice given to you by your health care provider. Make sure you discuss any questions you have with your health care provider. Document Released: 07/27/2004 Document Revised: 11/25/2015 Document Reviewed: 12/04/2012 Elsevier Interactive Patient Education  2017 Elsevier  Inc.   MAY ADD VIT B6 25mg  [place] but do take at the same time you take Phenergan

## 2018-01-16 ENCOUNTER — Ambulatory Visit (HOSPITAL_COMMUNITY)
Admission: RE | Admit: 2018-01-16 | Discharge: 2018-01-16 | Disposition: A | Discharge status: Home / Self Care | Payer: BLUE CROSS/BLUE SHIELD | Source: Ambulatory Visit | Attending: Obstetrics and Gynecology | Admitting: Obstetrics and Gynecology

## 2018-01-16 ENCOUNTER — Encounter: Payer: Self-pay | Admitting: Family Medicine

## 2018-01-16 ENCOUNTER — Ambulatory Visit: Payer: BLUE CROSS/BLUE SHIELD

## 2018-01-16 DIAGNOSIS — O3680X1 Pregnancy with inconclusive fetal viability, fetus 1: Secondary | ICD-10-CM | POA: Diagnosis not present

## 2018-01-16 DIAGNOSIS — Z3A01 Less than 8 weeks gestation of pregnancy: Secondary | ICD-10-CM | POA: Insufficient documentation

## 2018-01-16 DIAGNOSIS — Z362 Encounter for other antenatal screening follow-up: Secondary | ICD-10-CM

## 2018-01-16 DIAGNOSIS — Z3490 Encounter for supervision of normal pregnancy, unspecified, unspecified trimester: Secondary | ICD-10-CM

## 2018-01-16 NOTE — Progress Notes (Signed)
Pt presents to the office for US results. Pt informed that the yolk sac, embryo, and cardiac activity was visible. Pt states that she was diagnosed with anxiety and need to start medication. Pt informed that she would need to see the Behavior Clinician for us to decide which medication is safe for her. Pt is 7w 1d EDD 09/03/18. Understanding was voiced.

## 2018-01-21 NOTE — Progress Notes (Signed)
I have reviewed the chart and agree with nursing staff's documentation of this patient's encounter.  Mende Biswell, MD 01/21/2018 1:06 PM    

## 2018-01-24 ENCOUNTER — Encounter: Payer: Self-pay | Admitting: Clinical

## 2018-01-24 ENCOUNTER — Other Ambulatory Visit: Payer: Self-pay | Admitting: Obstetrics & Gynecology

## 2018-01-24 ENCOUNTER — Ambulatory Visit (INDEPENDENT_AMBULATORY_CARE_PROVIDER_SITE_OTHER): Payer: Medicaid Other | Admitting: Clinical

## 2018-01-24 DIAGNOSIS — F4310 Post-traumatic stress disorder, unspecified: Secondary | ICD-10-CM

## 2018-01-24 DIAGNOSIS — F411 Generalized anxiety disorder: Secondary | ICD-10-CM

## 2018-01-24 MED ORDER — ZOLPIDEM TARTRATE 5 MG PO TABS: 5.0000 mg | ORAL_TABLET | Freq: Every evening | ORAL | 1 refills | Status: DC | PRN
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
## 2018-01-24 NOTE — Progress Notes (Unsigned)
J. McManiss spoke to the patient and she has anxiety and insomnia. Rx Ambien 5 g 12 tabs sent to pharmacy  Adam PhenixArnold, James G, MD

## 2018-01-29 ENCOUNTER — Inpatient Hospital Stay (HOSPITAL_COMMUNITY)
Admission: AD | Admit: 2018-01-29 | Discharge: 2018-01-29 | Disposition: A | Discharge status: Home / Self Care | Payer: BLUE CROSS/BLUE SHIELD | Source: Ambulatory Visit | Attending: Family Medicine | Admitting: Family Medicine

## 2018-01-29 ENCOUNTER — Encounter (HOSPITAL_COMMUNITY): Payer: Self-pay | Admitting: *Deleted

## 2018-01-29 DIAGNOSIS — R109 Unspecified abdominal pain: Secondary | ICD-10-CM

## 2018-01-29 DIAGNOSIS — O26891 Other specified pregnancy related conditions, first trimester: Secondary | ICD-10-CM | POA: Diagnosis not present

## 2018-01-29 DIAGNOSIS — Z3A01 Less than 8 weeks gestation of pregnancy: Secondary | ICD-10-CM

## 2018-01-29 DIAGNOSIS — F17210 Nicotine dependence, cigarettes, uncomplicated: Secondary | ICD-10-CM | POA: Insufficient documentation

## 2018-01-29 DIAGNOSIS — N898 Other specified noninflammatory disorders of vagina: Secondary | ICD-10-CM | POA: Insufficient documentation

## 2018-01-29 DIAGNOSIS — M7918 Myalgia, other site: Secondary | ICD-10-CM

## 2018-01-29 DIAGNOSIS — O9989 Other specified diseases and conditions complicating pregnancy, childbirth and the puerperium: Secondary | ICD-10-CM | POA: Diagnosis not present

## 2018-01-29 DIAGNOSIS — Z3A09 9 weeks gestation of pregnancy: Secondary | ICD-10-CM | POA: Diagnosis not present

## 2018-01-29 DIAGNOSIS — O99331 Smoking (tobacco) complicating pregnancy, first trimester: Secondary | ICD-10-CM | POA: Insufficient documentation

## 2018-01-29 DIAGNOSIS — Z3481 Encounter for supervision of other normal pregnancy, first trimester: Secondary | ICD-10-CM

## 2018-01-29 LAB — URINALYSIS, ROUTINE W REFLEX MICROSCOPIC
Bilirubin Urine: NEGATIVE
Glucose, UA: NEGATIVE mg/dL
Hgb urine dipstick: NEGATIVE
Ketones, ur: NEGATIVE mg/dL
LEUKOCYTES UA: NEGATIVE
NITRITE: NEGATIVE
Protein, ur: NEGATIVE mg/dL
SPECIFIC GRAVITY, URINE: 1.015 (ref 1.005–1.030)
pH: 6 (ref 5.0–8.0)

## 2018-01-29 LAB — CBC WITH DIFFERENTIAL/PLATELET
BASOS ABS: 0 10*3/uL (ref 0.0–0.1)
BASOS PCT: 0 %
EOS PCT: 1 %
Eosinophils Absolute: 0.1 10*3/uL (ref 0.0–0.7)
HCT: 32.5 % — ABNORMAL LOW (ref 36.0–46.0)
Hemoglobin: 11.8 g/dL — ABNORMAL LOW (ref 12.0–15.0)
Lymphocytes Relative: 27 %
Lymphs Abs: 1.7 10*3/uL (ref 0.7–4.0)
MCH: 33.7 pg (ref 26.0–34.0)
MCHC: 36.3 g/dL — AB (ref 30.0–36.0)
MCV: 92.9 fL (ref 78.0–100.0)
MONO ABS: 0.2 10*3/uL (ref 0.1–1.0)
MONOS PCT: 3 %
Neutro Abs: 4.4 10*3/uL (ref 1.7–7.7)
Neutrophils Relative %: 69 %
PLATELETS: 255 10*3/uL (ref 150–400)
RBC: 3.5 MIL/uL — ABNORMAL LOW (ref 3.87–5.11)
RDW: 13.8 % (ref 11.5–15.5)
WBC: 6.3 10*3/uL (ref 4.0–10.5)

## 2018-01-29 LAB — WET PREP, GENITAL
CLUE CELLS WET PREP: NONE SEEN
SPERM: NONE SEEN
TRICH WET PREP: NONE SEEN
YEAST WET PREP: NONE SEEN

## 2018-01-29 LAB — HCG, QUANTITATIVE, PREGNANCY: hCG, Beta Chain, Quant, S: 99266 m[IU]/mL — ABNORMAL HIGH (ref <5)

## 2018-01-29 MED ORDER — CYCLOBENZAPRINE HCL 10 MG PO TABS: 10.0000 mg | ORAL_TABLET | Freq: Two times a day (BID) | ORAL | 0 refills | Status: DC | PRN

## 2018-01-29 MED ORDER — ACETAMINOPHEN 500 MG PO TABS
1000.0000 mg | ORAL_TABLET | Freq: Once | ORAL | Status: AC
  Administered 2018-01-29: 1000 mg via ORAL
  Filled 2018-01-29: qty 2

## 2018-01-29 NOTE — MAU Provider Note (Signed)
History     CSN: [id number]  Arrival date and time: 01/29/18 1134   First Provider Initiated Contact with Patient 01/29/18 1248      Chief Complaint  Patient presents with  . Abdominal Pain   HPI  Sabrina BlightDenekia D Messmer is a 27 y.o. [id number] at 4863w0d who presents to MAU with chief complaint of abdominal pain. States this is a new problem, onset this morning at 8:00am when she finished work. Rates pain as 8/10, suprapubic, radiates to mid-abdomen, no aggravating factors, no alleviating factors. Tried to manage pain with PO Tylenol, 500 mg at 8:00am, without success. States she vomited once this morning r/t pain level.   Patient states she is out of Flexeril as prescribed during previous visit.  Vaginal Discharge This is a new problem, resolved on arrival in MAU. Patient reports new onset brown vaginal discharge that coincided with onset of abdominal pain.   Denies vaginal bleeding, leaking of fluid,  fever, falls, or recent illness.    OB History    Gravida  4   Para  1   Term  1   Preterm      AB  2   Living  1     SAB  2   TAB      Ectopic      Multiple      Live Births  1           Past Medical History:  Diagnosis Date  . Anxiety   . Depression   . Panic attack     Past Surgical History:  Procedure Laterality Date  . ABSCESS DRAINAGE    . CESAREAN SECTION      No family history on file.  Social History   Tobacco Use  . Smoking status: Current Some Day Smoker    Packs/day: 0.25    Types: Cigarettes  . Smokeless tobacco: Never Used  . Tobacco comment: 2/day  Substance Use Topics  . Alcohol use: No  . Drug use: Yes    Types: Marijuana    Comment: daily    Allergies: No Known Allergies  Medications Prior to Admission  Medication Sig Dispense Refill Last Dose  . acetaminophen (TYLENOL) 500 MG tablet Take 1,000 mg by mouth every 8 (eight) hours as needed for mild pain.   Taking  . cyclobenzaprine (FLEXERIL) 10 MG tablet Take 1 tablet (10 mg  total) by mouth 2 (two) times daily as needed for muscle spasms. 20 tablet 0 Taking  . Prenatal Vit-Fe Fumarate-FA (PRENATAL MULTIVITAMIN) TABS tablet Take 1 tablet by mouth daily at 12 noon.   Taking  . promethazine (PHENERGAN) 12.5 MG tablet Take 1 tablet (12.5 mg total) by mouth every 8 (eight) hours as needed for nausea or vomiting. 30 tablet 0 Taking  . zolpidem (AMBIEN) 5 MG tablet Take 1 tablet (5 mg total) by mouth at bedtime as needed for sleep. 12 tablet 1     Review of Systems  Constitutional: Negative for fever.  Gastrointestinal: Positive for abdominal pain. Negative for nausea and vomiting.  Genitourinary: Positive for vaginal discharge.  Musculoskeletal: Negative for back pain.  Neurological: Negative for headaches.  All other systems reviewed and are negative.  Physical Exam   Blood pressure 110/78, pulse 100, temperature 98.5 F (36.9 C), temperature source Oral, resp. rate 18, weight 125 lb (56.7 kg), last menstrual period 11/22/2017, unknown if currently breastfeeding.  Physical Exam  Nursing note and vitals reviewed. Constitutional: She is oriented to person, place,  and time. She appears well-developed and well-nourished.  Cardiovascular: Normal rate, regular rhythm, normal heart sounds and intact distal pulses.  Respiratory: Effort normal and breath sounds normal.  GI: Soft. Bowel sounds are normal. There is no tenderness.  Genitourinary: Vagina normal and uterus normal. Cervix exhibits motion tenderness. No vaginal discharge found.  Neurological: She is alert and oriented to person, place, and time. She has normal reflexes.  Skin: Skin is warm and dry.  Small abscess visible on medial surface of left thigh  Psychiatric: She has a normal mood and affect. Her behavior is normal. Judgment and thought content normal.    MAU Course  Procedures  MDM Bleeding/discharge resolved upon arrival to MAU Abdominal pain resolved with PO Tylenol given in MAU IUP previously  confirmed, fetal cardiac activity (160 bpm) visualized on bedside ultrasound performed by Paulina Fusi, CNM No concerning results on labs drawn in MAU  Patient Vitals for the past 24 hrs:  BP Temp Temp src Pulse Resp Weight  01/29/18 1148 110/78 98.5 F (36.9 C) Oral 100 18 125 lb (56.7 kg)    Orders Placed This Encounter  Procedures  . Wet prep, genital  . Urinalysis, Routine w reflex microscopic  . CBC with Differential/Platelet  . hCG, quantitative, pregnancy  . Discharge patient   Results for orders placed or performed during the hospital encounter of 01/29/18 (from the past 24 hour(s))  Urinalysis, Routine w reflex microscopic     Status: None   Collection Time: 01/29/18 11:57 AM  Result Value Ref Range   Color, Urine YELLOW YELLOW   APPearance CLEAR CLEAR   Specific Gravity, Urine 1.015 1.005 - 1.030   pH 6.0 5.0 - 8.0   Glucose, UA NEGATIVE NEGATIVE mg/dL   Hgb urine dipstick NEGATIVE NEGATIVE   Bilirubin Urine NEGATIVE NEGATIVE   Ketones, ur NEGATIVE NEGATIVE mg/dL   Protein, ur NEGATIVE NEGATIVE mg/dL   Nitrite NEGATIVE NEGATIVE   Leukocytes, UA NEGATIVE NEGATIVE  CBC with Differential/Platelet     Status: Abnormal   Collection Time: 01/29/18 12:56 PM  Result Value Ref Range   WBC 6.3 4.0 - 10.5 K/uL   RBC 3.50 (L) 3.87 - 5.11 MIL/uL   Hemoglobin 11.8 (L) 12.0 - 15.0 g/dL   HCT 60.4 (L) 54.0 - 98.1 %   MCV 92.9 78.0 - 100.0 fL   MCH 33.7 26.0 - 34.0 pg   MCHC 36.3 (H) 30.0 - 36.0 g/dL   RDW 19.1 47.8 - 29.5 %   Platelets 255 150 - 400 K/uL   Neutrophils Relative % 69 %   Neutro Abs 4.4 1.7 - 7.7 K/uL   Lymphocytes Relative 27 %   Lymphs Abs 1.7 0.7 - 4.0 K/uL   Monocytes Relative 3 %   Monocytes Absolute 0.2 0.1 - 1.0 K/uL   Eosinophils Relative 1 %   Eosinophils Absolute 0.1 0.0 - 0.7 K/uL   Basophils Relative 0 %   Basophils Absolute 0.0 0.0 - 0.1 K/uL  Wet prep, genital     Status: Abnormal   Collection Time: 01/29/18  1:00 PM  Result Value Ref Range    Yeast Wet Prep HPF POC NONE SEEN NONE SEEN   Trich, Wet Prep NONE SEEN NONE SEEN   Clue Cells Wet Prep HPF POC NONE SEEN NONE SEEN   WBC, Wet Prep HPF POC FEW (A) NONE SEEN   Sperm NONE SEEN    Meds ordered this encounter  Medications  . acetaminophen (TYLENOL) tablet 1,000 mg  .  cyclobenzaprine (FLEXERIL) 10 MG tablet    Sig: Take 1 tablet (10 mg total) by mouth 2 (two) times daily as needed for muscle spasms.    Dispense:  30 tablet    Refill:  0    Order Specific Question:   Supervising Provider    Answer:   Reva Bores [[id number]]    Assessment and Plan  --27 y.o. [phone] with IUP at [redacted]w[redacted]d  --Fetal cardiac activity confirmed by bedside ultrasound --Bleeding and abdominal pain resolved --Paper Rx for Flexeril given to patient --Confirmed patient should initiate daily prenatal vitamin --Discharge home in stable condition with OB/bleeding precautions  New OB appointment scheduled for 02/20/2018  Calvert Cantor, CNM 01/29/2018, 1:41 PM

## 2018-01-29 NOTE — Discharge Instructions (Signed)
First Trimester of Pregnancy The first trimester of pregnancy is from week 1 until the end of week 13 (months 1 through 3). During this time, your baby will begin to develop inside you. At 6-8 weeks, the eyes and face are formed, and the heartbeat can be seen on ultrasound. At the end of 12 weeks, all the baby's organs are formed. Prenatal care is all the medical care you receive before the birth of your baby. Make sure you get good prenatal care and follow all of your doctor's instructions. Follow these instructions at home: Medicines  Take over-the-counter and prescription medicines only as told by your doctor. Some medicines are safe and some medicines are not safe during pregnancy.  Take a prenatal vitamin that contains at least 600 micrograms (mcg) of folic acid.  If you have trouble pooping (constipation), take medicine that will make your stool soft (stool softener) if your doctor approves. Eating and drinking  Eat regular, healthy meals.  Your doctor will tell you the amount of weight gain that is right for you.  Avoid raw meat and uncooked cheese.  If you feel sick to your stomach (nauseous) or throw up (vomit): ? Eat 4 or 5 small meals a day instead of 3 large meals. ? Try eating a few soda crackers. ? Drink liquids between meals instead of during meals.  To prevent constipation: ? Eat foods that are high in fiber, like fresh fruits and vegetables, whole grains, and beans. ? Drink enough fluids to keep your pee (urine) clear or pale yellow. Activity  Exercise only as told by your doctor. Stop exercising if you have cramps or pain in your lower belly (abdomen) or low back.  Do not exercise if it is too hot, too humid, or if you are in a place of great height (high altitude).  Try to avoid standing for long periods of time. Move your legs often if you must stand in one place for a long time.  Avoid heavy lifting.  Wear low-heeled shoes. Sit and stand up straight.  You  can have sex unless your doctor tells you not to. Relieving pain and discomfort  Wear a good support bra if your breasts are sore.  Take warm water baths (sitz baths) to soothe pain or discomfort caused by hemorrhoids. Use hemorrhoid cream if your doctor says it is okay.  Rest with your legs raised if you have leg cramps or low back pain.  If you have puffy, bulging veins (varicose veins) in your legs: ? Wear support hose or compression stockings as told by your doctor. ? Raise (elevate) your feet for 15 minutes, 3-4 times a day. ? Limit salt in your food. Prenatal care  Schedule your prenatal visits by the twelfth week of pregnancy.  Write down your questions. Take them to your prenatal visits.  Keep all your prenatal visits as told by your doctor. This is important. Safety  Wear your seat belt at all times when driving.  Make a list of emergency phone numbers. The list should include numbers for family, friends, the hospital, and police and fire departments. General instructions  Ask your doctor for a referral to a local prenatal class. Begin classes no later than at the start of month 6 of your pregnancy.  Ask for help if you need counseling or if you need help with nutrition. Your doctor can give you advice or tell you where to go for help.  Do not use hot tubs, steam rooms, or   saunas.  Do not douche or use tampons or scented sanitary pads.  Do not cross your legs for long periods of time.  Avoid all herbs and alcohol. Avoid drugs that are not approved by your doctor.  Do not use any tobacco products, including cigarettes, chewing tobacco, and electronic cigarettes. If you need help quitting, ask your doctor. You may get counseling or other support to help you quit.  Avoid cat litter boxes and soil used by cats. These carry germs that can cause birth defects in the baby and can cause a loss of your baby (miscarriage) or stillbirth.  Visit your dentist. At home, brush  your teeth with a soft toothbrush. Be gentle when you floss. Contact a doctor if:  You are dizzy.  You have mild cramps or pressure in your lower belly.  You have a nagging pain in your belly area.  You continue to feel sick to your stomach, you throw up, or you have watery poop (diarrhea).  You have a bad smelling fluid coming from your vagina.  You have pain when you pee (urinate).  You have increased puffiness (swelling) in your face, hands, legs, or ankles. Get help right away if:  You have a fever.  You are leaking fluid from your vagina.  You have spotting or bleeding from your vagina.  You have very bad belly cramping or pain.  You gain or lose weight rapidly.  You throw up blood. It may look like coffee grounds.  You are around people who have German measles, fifth disease, or chickenpox.  You have a very bad headache.  You have shortness of breath.  You have any kind of trauma, such as from a fall or a car accident. Summary  The first trimester of pregnancy is from week 1 until the end of week 13 (months 1 through 3).  To take care of yourself and your unborn baby, you will need to eat healthy meals, take medicines only if your doctor tells you to do so, and do activities that are safe for you and your baby.  Keep all follow-up visits as told by your doctor. This is important as your doctor will have to ensure that your baby is healthy and growing well. This information is not intended to replace advice given to you by your health care provider. Make sure you discuss any questions you have with your health care provider. Document Released: 12/06/2007 Document Revised: 06/27/2016 Document Reviewed: 06/27/2016 Elsevier Interactive Patient Education  2017 Elsevier Inc.  

## 2018-01-29 NOTE — MAU Note (Signed)
Pt C/O lower abdominal pain that started during the night, took tylenol, also hurts to urinate, has ? Abscess on L groin. Is having brown discharge, denies bright red bleeding.

## 2018-01-30 LAB — GC/CHLAMYDIA PROBE AMP (~~LOC~~) NOT AT ARMC
CHLAMYDIA, DNA PROBE: NEGATIVE
Neisseria Gonorrhea: NEGATIVE

## 2018-02-20 ENCOUNTER — Encounter: Payer: Self-pay | Admitting: Obstetrics and Gynecology

## 2018-02-20 ENCOUNTER — Ambulatory Visit (INDEPENDENT_AMBULATORY_CARE_PROVIDER_SITE_OTHER): Payer: BLUE CROSS/BLUE SHIELD | Admitting: Obstetrics and Gynecology

## 2018-02-20 ENCOUNTER — Encounter: Payer: Self-pay | Admitting: Family Medicine

## 2018-02-20 ENCOUNTER — Ambulatory Visit (INDEPENDENT_AMBULATORY_CARE_PROVIDER_SITE_OTHER): Payer: Self-pay | Admitting: Clinical

## 2018-02-20 ENCOUNTER — Other Ambulatory Visit (HOSPITAL_COMMUNITY)
Admission: RE | Admit: 2018-02-20 | Discharge: 2018-02-20 | Disposition: A | Discharge status: Home / Self Care | Payer: Medicaid Other | Source: Ambulatory Visit | Attending: Obstetrics and Gynecology | Admitting: Obstetrics and Gynecology

## 2018-02-20 VITALS — BP 123/70 | HR 85 | Wt 128.3 lb

## 2018-02-20 DIAGNOSIS — O34219 Maternal care for unspecified type scar from previous cesarean delivery: Secondary | ICD-10-CM | POA: Insufficient documentation

## 2018-02-20 DIAGNOSIS — Z3A12 12 weeks gestation of pregnancy: Secondary | ICD-10-CM | POA: Insufficient documentation

## 2018-02-20 DIAGNOSIS — Z3481 Encounter for supervision of other normal pregnancy, first trimester: Secondary | ICD-10-CM | POA: Insufficient documentation

## 2018-02-20 DIAGNOSIS — F411 Generalized anxiety disorder: Secondary | ICD-10-CM

## 2018-02-20 DIAGNOSIS — F322 Major depressive disorder, single episode, severe without psychotic features: Secondary | ICD-10-CM

## 2018-02-20 DIAGNOSIS — O99330 Smoking (tobacco) complicating pregnancy, unspecified trimester: Secondary | ICD-10-CM

## 2018-02-20 DIAGNOSIS — Z3480 Encounter for supervision of other normal pregnancy, unspecified trimester: Secondary | ICD-10-CM

## 2018-02-20 DIAGNOSIS — Z8659 Personal history of other mental and behavioral disorders: Secondary | ICD-10-CM

## 2018-02-20 DIAGNOSIS — F4310 Post-traumatic stress disorder, unspecified: Secondary | ICD-10-CM
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
## 2018-02-20 NOTE — Patient Instructions (Signed)
Genetic Screening Results Information: You are having genetic testing called [facility] today.  It will take approximately 2 weeks before the results are available.  To get your results, you need Internet access to a web browser to search [facility]/MyChart (the direct app on your phone will not give you these results).  Then select Lab Scanned and click on the blue hyper link that says View Image to see your [facility] results.  You can also use the directions on the purple card given to look up your results directly on the [facility] website.  Second Trimester of Pregnancy The second trimester is from week 13 through week 28, month 4 through 6. This is often the time in pregnancy that you feel your best. Often times, morning sickness has lessened or quit. You may have more energy, and you may get hungry more often. Your unborn baby (fetus) is growing rapidly. At the end of the sixth month, he or she is about 9 inches long and weighs about 1 pounds. You will likely feel the baby move (quickening) between 18 and 20 weeks of pregnancy. Follow these instructions at home:  Avoid all smoking, herbs, and alcohol. Avoid drugs not approved by your doctor.  Do not use any tobacco products, including cigarettes, chewing tobacco, and electronic cigarettes. If you need help quitting, ask your doctor. You may get counseling or other support to help you quit.  Only take medicine as told by your doctor. Some medicines are safe and some are not during pregnancy.  Exercise only as told by your doctor. Stop exercising if you start having cramps.  Eat regular, healthy meals.  Wear a good support bra if your breasts are tender.  Do not use hot tubs, steam rooms, or saunas.  Wear your seat belt when driving.  Avoid raw meat, uncooked cheese, and liter boxes and soil used by cats.  Take your prenatal vitamins.  Take 1500-2000 milligrams of calcium daily starting at the 20th week of pregnancy until you deliver your  baby.  Try taking medicine that helps you poop (stool softener) as needed, and if your doctor approves. Eat more fiber by eating fresh fruit, vegetables, and whole grains. Drink enough fluids to keep your pee (urine) clear or pale yellow.  Take warm water baths (sitz baths) to soothe pain or discomfort caused by hemorrhoids. Use hemorrhoid cream if your doctor approves.  If you have puffy, bulging veins (varicose veins), wear support hose. Raise (elevate) your feet for 15 minutes, 3-4 times a day. Limit salt in your diet.  Avoid heavy lifting, wear low heals, and sit up straight.  Rest with your legs raised if you have leg cramps or low back pain.  Visit your dentist if you have not gone during your pregnancy. Use a soft toothbrush to brush your teeth. Be gentle when you floss.  You can have sex (intercourse) unless your doctor tells you not to.  Go to your doctor visits. Get help if:  You feel dizzy.  You have mild cramps or pressure in your lower belly (abdomen).  You have a nagging pain in your belly area.  You continue to feel sick to your stomach (nauseous), throw up (vomit), or have watery poop (diarrhea).  You have bad smelling fluid coming from your vagina.  You have pain with peeing (urination). Get help right away if:  You have a fever.  You are leaking fluid from your vagina.  You have spotting or bleeding from your vagina.  You have  severe belly cramping or pain.  You lose or gain weight rapidly.  You have trouble catching your breath and have chest pain.  You notice sudden or extreme puffiness (swelling) of your face, hands, ankles, feet, or legs.  You have not felt the baby move in over an hour.  You have severe headaches that do not go away with medicine.  You have vision changes. This information is not intended to replace advice given to you by your health care provider. Make sure you discuss any questions you have with your health care  provider. Document Released: 09/13/2009 Document Revised: 11/25/2015 Document Reviewed: 08/20/2012 Elsevier Interactive Patient Education  2017 [employer]

## 2018-02-21 ENCOUNTER — Ambulatory Visit: Payer: Self-pay | Admitting: Family Medicine

## 2018-02-21 LAB — CYTOLOGY - PAP
CHLAMYDIA, DNA PROBE: NEGATIVE
DIAGNOSIS: NEGATIVE
NEISSERIA GONORRHEA: NEGATIVE

## 2018-02-22 LAB — URINE CULTURE, OB REFLEX

## 2018-02-22 LAB — CULTURE, OB URINE

## 2018-02-28 ENCOUNTER — Telehealth: Payer: Self-pay | Admitting: *Deleted

## 2018-02-28 LAB — HEMOGLOBINOPATHY EVALUATION
Ferritin: 45 ng/mL (ref 15–150)
HGB A2 QUANT: 2.1 % (ref 1.8–3.2)
HGB A: 97.9 % (ref 96.4–98.8)
HGB S: 0 %
HGB VARIANT: 0 %
Hgb C: 0 %
Hgb F Quant: 0 % (ref 0.0–2.0)
Hgb Solubility: NEGATIVE

## 2018-02-28 LAB — OBSTETRIC PANEL, INCLUDING HIV
Antibody Screen: NEGATIVE
BASOS: 0 %
Basophils Absolute: 0 10*3/uL (ref 0.0–0.2)
EOS (ABSOLUTE): 0 10*3/uL (ref 0.0–0.4)
EOS: 1 %
HIV Screen 4th Generation wRfx: NONREACTIVE
Hematocrit: 35.3 % (ref 34.0–46.6)
Hemoglobin: 12.9 g/dL (ref 11.1–15.9)
Hepatitis B Surface Ag: NEGATIVE
IMMATURE GRANS (ABS): 0 10*3/uL (ref 0.0–0.1)
IMMATURE GRANULOCYTES: 0 %
Lymphocytes Absolute: 0.9 10*3/uL (ref 0.7–3.1)
Lymphs: 18 %
MCH: 35.1 pg — AB (ref 26.6–33.0)
MCHC: 36.5 g/dL — AB (ref 31.5–35.7)
MCV: 96 fL (ref 79–97)
Monocytes Absolute: 0.2 10*3/uL (ref 0.1–0.9)
Monocytes: 4 %
NEUTROS PCT: 77 %
Neutrophils Absolute: 4 10*3/uL (ref 1.4–7.0)
Platelets: 261 10*3/uL (ref 150–450)
RBC: 3.68 x10E6/uL — ABNORMAL LOW (ref 3.77–5.28)
RDW: 13.5 % (ref 12.3–15.4)
RH TYPE: POSITIVE
RPR: NONREACTIVE
Rubella Antibodies, IGG: 8.75 index (ref >0.99)
WBC: 5.2 10*3/uL (ref 3.4–10.8)

## 2018-02-28 LAB — CYSTIC FIBROSIS GENE TEST

## 2018-02-28 LAB — SMN1 COPY NUMBER ANALYSIS (SMA CARRIER SCREENING)

## 2018-02-28 NOTE — Telephone Encounter (Signed)
Received a  Voicemail this pm stating she has read her results but does not understand them. States she is [redacted] wk pregnant and would like a call back.

## 2018-03-01 NOTE — Telephone Encounter (Signed)
I called Sabrina Rasmussen and left a message I am returning her call and if she still has questions she can call us back next Tuesday as we are closed for the weekend.

## 2018-03-05 ENCOUNTER — Telehealth: Payer: Self-pay | Admitting: *Deleted

## 2018-03-05 NOTE — Telephone Encounter (Signed)
Pt called x2 to go over test results.  Pt did not specify which test results but reported wanting to better understand test results and to know if meds are needed.

## 2018-03-06 ENCOUNTER — Encounter: Payer: Self-pay | Admitting: *Deleted

## 2018-03-06 NOTE — Telephone Encounter (Signed)
I called Sabrina Rasmussen back and explained her results to her ( ob panel, urine culture, hemoglobin evaluation, cystic fibroisis, pap.  I also informed her genetic screening not back yet, but should be back soon and can be reviewed at her next ob visit.

## 2018-03-06 NOTE — Telephone Encounter (Signed)
Patient called back to say she could not see all her results in her MyChart. She is requesting a call back at her number listed in Epic.

## 2018-03-06 NOTE — Telephone Encounter (Signed)
See other telephone note from 03/01/18

## 2018-03-11 ENCOUNTER — Encounter: Payer: Self-pay | Admitting: *Deleted

## 2018-03-11 ENCOUNTER — Telehealth: Payer: Self-pay | Admitting: *Deleted

## 2018-03-11 NOTE — Telephone Encounter (Signed)
Pt left message stating she is calling about test results.  Please call back.

## 2018-03-12 NOTE — Telephone Encounter (Signed)
Called patient- she wanted to know why the gender was not completed on her generic screening. I have advised patient we have added it on today and sent the paper work to [facility] to be added.

## 2018-03-15 ENCOUNTER — Inpatient Hospital Stay (HOSPITAL_COMMUNITY)
Admission: AD | Admit: 2018-03-15 | Discharge: 2018-03-15 | Disposition: A | Discharge status: Home / Self Care | Payer: BLUE CROSS/BLUE SHIELD | Source: Ambulatory Visit | Attending: Obstetrics & Gynecology | Admitting: Obstetrics & Gynecology

## 2018-03-15 ENCOUNTER — Encounter (HOSPITAL_COMMUNITY): Payer: Self-pay | Admitting: *Deleted

## 2018-03-15 ENCOUNTER — Other Ambulatory Visit: Payer: Self-pay

## 2018-03-15 DIAGNOSIS — R51 Headache: Secondary | ICD-10-CM | POA: Diagnosis not present

## 2018-03-15 DIAGNOSIS — M549 Dorsalgia, unspecified: Secondary | ICD-10-CM | POA: Insufficient documentation

## 2018-03-15 DIAGNOSIS — W19XXXA Unspecified fall, initial encounter: Secondary | ICD-10-CM | POA: Diagnosis not present

## 2018-03-15 DIAGNOSIS — Z3A15 15 weeks gestation of pregnancy: Secondary | ICD-10-CM | POA: Diagnosis not present

## 2018-03-15 DIAGNOSIS — R109 Unspecified abdominal pain: Secondary | ICD-10-CM | POA: Diagnosis not present

## 2018-03-15 DIAGNOSIS — G44319 Acute post-traumatic headache, not intractable: Secondary | ICD-10-CM | POA: Diagnosis not present

## 2018-03-15 DIAGNOSIS — Z3480 Encounter for supervision of other normal pregnancy, unspecified trimester: Secondary | ICD-10-CM

## 2018-03-15 DIAGNOSIS — O26892 Other specified pregnancy related conditions, second trimester: Secondary | ICD-10-CM | POA: Insufficient documentation

## 2018-03-15 LAB — WET PREP, GENITAL
Clue Cells Wet Prep HPF POC: NONE SEEN
SPERM: NONE SEEN
Trich, Wet Prep: NONE SEEN
YEAST WET PREP: NONE SEEN

## 2018-03-15 LAB — URINALYSIS, ROUTINE W REFLEX MICROSCOPIC
BILIRUBIN URINE: NEGATIVE
Glucose, UA: NEGATIVE mg/dL
HGB URINE DIPSTICK: NEGATIVE
KETONES UR: NEGATIVE mg/dL
Leukocytes, UA: NEGATIVE
Nitrite: NEGATIVE
Protein, ur: NEGATIVE mg/dL
SPECIFIC GRAVITY, URINE: 1.015 (ref 1.005–1.030)
pH: 7 (ref 5.0–8.0)

## 2018-03-15 MED ORDER — CYCLOBENZAPRINE HCL 10 MG PO TABS: 10.0000 mg | ORAL_TABLET | Freq: Three times a day (TID) | ORAL | 0 refills | Status: DC | PRN

## 2018-03-15 MED ORDER — BUTALBITAL-APAP-CAFFEINE 50-325-40 MG PO TABS: 1.0000 | ORAL_TABLET | Freq: Four times a day (QID) | ORAL | 0 refills | Status: DC | PRN

## 2018-03-15 MED ORDER — BUTALBITAL-APAP-CAFFEINE 50-325-40 MG PO TABS
2.0000 | ORAL_TABLET | Freq: Four times a day (QID) | ORAL | Status: DC | PRN
  Administered 2018-03-15: 2 via ORAL
  Filled 2018-03-15: qty 2
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
## 2018-03-15 NOTE — MAU Provider Note (Addendum)
History     CSN: [id number]  Arrival date and time: 03/15/18 62130905   First Provider Initiated Contact with Patient 03/15/18 1349      Chief Complaint  Patient presents with  . Abdominal Pain  . Back Pain  . Shoulder Pain   G4P1021 @15 .3 wks here with HA, back pain, and abdominal cramping. HA is temporal and started after she fell yesterday at work around 8am. Reports hitting her forehead on a pole then fell backward and passed out. When she came to she was in the break room. She felt better after a short time and proceeded to work her shift. No amnesia, she remembers her morning prior to arriving at work. Had some nausea but no vomiting. Rates 8/10. Took Tylenol but had no relief. Cramping has been ongoing during the pregnancy but worse today. Denies urinary sx. No VB but having thick white vaginal discharge. No itching.  OB History    Gravida  4   Para  1   Term  1   Preterm  0   AB  2   Living  1     SAB  2   TAB  0   Ectopic  0   Multiple  0   Live Births  1           Past Medical History:  Diagnosis Date  . Anxiety   . Depression   . Panic attack     Past Surgical History:  Procedure Laterality Date  . ABSCESS DRAINAGE    . CESAREAN SECTION      History reviewed. No pertinent family history.  Social History   Tobacco Use  . Smoking status: Former Smoker    Packs/day: 0.25    Types: Cigarettes  . Smokeless tobacco: Never Used  . Tobacco comment: 2/day  Substance Use Topics  . Alcohol use: No  . Drug use: Yes    Types: Marijuana    Comment: last marijuana use beginning of July/2019    Allergies: No Known Allergies  Medications Prior to Admission  Medication Sig Dispense Refill Last Dose  . Prenatal Vit-Fe Fumarate-FA (PRENATAL MULTIVITAMIN) TABS tablet Take 1 tablet by mouth daily at 12 noon.   03/15/2018 at Unknown time  . promethazine (PHENERGAN) 12.5 MG tablet Take 1 tablet (12.5 mg total) by mouth every 8 (eight) hours as needed  for nausea or vomiting. 30 tablet 0 Past Week at Unknown time  . [DISCONTINUED] cyclobenzaprine (FLEXERIL) 10 MG tablet Take 1 tablet (10 mg total) by mouth 2 (two) times daily as needed for muscle spasms. (Patient not taking: Reported on 03/15/2018) 30 tablet 0 Not Taking at Unknown time  . [DISCONTINUED] zolpidem (AMBIEN) 5 MG tablet Take 1 tablet (5 mg total) by mouth at bedtime as needed for sleep. 12 tablet 1 Taking    Review of Systems  HENT: Negative for rhinorrhea.   Eyes: Negative for visual disturbance.  Gastrointestinal: Positive for abdominal pain, constipation and nausea. Negative for vomiting.  Genitourinary: Positive for vaginal discharge. Negative for dysuria and vaginal bleeding.  Musculoskeletal: Positive for back pain.  Neurological: Positive for syncope and headaches. Negative for weakness.   Physical Exam   Blood pressure 109/66, pulse 75, temperature 98.6 F (37 C), temperature source Oral, resp. rate 16, weight 60.2 kg, last menstrual period 11/22/2017, SpO2 100 %, unknown if currently breastfeeding.  Physical Exam  Nursing note and vitals reviewed. Constitutional: She is oriented to person, place, and time. She appears well-developed and  well-nourished. No distress.  HENT:  Head: Normocephalic and atraumatic.  Eyes: Pupils are equal, round, and reactive to light. Conjunctivae and EOM are normal. Lids are everted and swept, no foreign bodies found.  Neck: Normal range of motion.  Cardiovascular: Normal rate.  Respiratory: No respiratory distress.  GI: Soft. She exhibits no distension and no mass. There is no tenderness. There is no rebound and no guarding.  Musculoskeletal: Normal range of motion.       Cervical back: Normal. She exhibits no tenderness and no deformity.       Thoracic back: Normal. She exhibits no tenderness and no deformity.       Lumbar back: She exhibits no tenderness and no deformity.  Neurological: She is alert and oriented to person, place,  and time. She has normal strength and normal reflexes. No cranial nerve deficit or sensory deficit. Coordination normal. GCS eye subscore is 4. GCS verbal subscore is 5. GCS motor subscore is 6.  Reflex Scores:      Patellar reflexes are 2+ on the right side and 2+ on the left side. Skin: Skin is warm and dry. No erythema.  Psychiatric: She has a normal mood and affect.  FHT: 150  Results for orders placed or performed during the hospital encounter of 03/15/18 (from the past 24 hour(s))  Urinalysis, Routine w reflex microscopic     Status: None   Collection Time: 03/15/18  2:20 PM  Result Value Ref Range   Color, Urine YELLOW YELLOW   APPearance CLEAR CLEAR   Specific Gravity, Urine 1.015 1.005 - 1.030   pH 7.0 5.0 - 8.0   Glucose, UA NEGATIVE NEGATIVE mg/dL   Hgb urine dipstick NEGATIVE NEGATIVE   Bilirubin Urine NEGATIVE NEGATIVE   Ketones, ur NEGATIVE NEGATIVE mg/dL   Protein, ur NEGATIVE NEGATIVE mg/dL   Nitrite NEGATIVE NEGATIVE   Leukocytes, UA NEGATIVE NEGATIVE  Wet prep, genital     Status: Abnormal   Collection Time: 03/15/18  3:00 PM  Result Value Ref Range   Yeast Wet Prep HPF POC NONE SEEN NONE SEEN   Trich, Wet Prep NONE SEEN NONE SEEN   Clue Cells Wet Prep HPF POC NONE SEEN NONE SEEN   WBC, Wet Prep HPF POC FEW (A) NONE SEEN   Sperm NONE SEEN    MAU Course  Procedures Fioricet  MDM Labs ordered and reviewed. No evidence of head fracture or cuncussion. Normal neuro exam. Based on [facility] head CT not indicated. HA resolved after med. Back pain still present, will try Flexeril and heat. No evidence of threatened SAB or UTI, pain likely MSK. Stable for discharge home.  Assessment and Plan  15.[redacted] weeks gestation Headache S/p fall MSK back pain  Discharge home Follow up in OB office as scheduled Strict return precautions>[facility] if HA persists Rx Flexeril Rx Fioricet  Allergies as of 03/15/2018   No Known Allergies     Medication List    STOP taking these  medications   zolpidem 5 MG tablet Commonly known as:  AMBIEN     TAKE these medications   butalbital-acetaminophen-caffeine 50-325-40 MG tablet Commonly known as:  FIORICET, ESGIC Take 1-2 tablets by mouth every 6 (six) hours as needed for headache or migraine.   cyclobenzaprine 10 MG tablet Commonly known as:  FLEXERIL Take 1 tablet (10 mg total) by mouth 3 (three) times daily as needed for muscle spasms. What changed:  when to take this   prenatal multivitamin Tabs tablet Take 1 tablet  by mouth daily at 12 noon.   promethazine 12.5 MG tablet Commonly known as:  PHENERGAN Take 1 tablet (12.5 mg total) by mouth every 8 (eight) hours as needed for nausea or vomiting.      Donette Larry, CNM 03/15/2018, 5:06 PM

## 2018-03-15 NOTE — Discharge Instructions (Signed)
Migraine Headache A migraine headache is a very strong throbbing pain on one side or both sides of your head. Migraines can also cause other symptoms. Talk with your doctor about what things may bring on (trigger) your migraine headaches. Follow these instructions at home: Medicines  Take over-the-counter and prescription medicines only as told by your doctor.  Do not drive or use heavy machinery while taking prescription pain medicine.  To prevent or treat constipation while you are taking prescription pain medicine, your doctor may recommend that you: ? Drink enough fluid to keep your pee (urine) clear or pale yellow. ? Take over-the-counter or prescription medicines. ? Eat foods that are high in fiber. These include fresh fruits and vegetables, whole grains, and beans. ? Limit foods that are high in fat and processed sugars. These include fried and sweet foods. Lifestyle  Avoid alcohol.  Do not use any products that contain nicotine or tobacco, such as cigarettes and e-cigarettes. If you need help quitting, ask your doctor.  Get at least 8 hours of sleep every night.  Limit your stress. General instructions   Keep a journal to find out what may bring on your migraines. For example, write down: ? What you eat and drink. ? How much sleep you get. ? Any change in what you eat or drink. ? Any change in your medicines.  If you have a migraine: ? Avoid things that make your symptoms worse, such as bright lights. ? It may help to lie down in a dark, quiet room. ? Do not drive or use heavy machinery. ? Ask your doctor what activities are safe for you.  Keep all follow-up visits as told by your doctor. This is important. Contact a doctor if:  You get a migraine that is different or worse than your usual migraines. Get help right away if:  Your migraine gets very bad.  You have a fever.  You have a stiff neck.  You have trouble seeing.  Your muscles feel weak or like you  cannot control them.  You start to lose your balance a lot.  You start to have trouble walking.  You pass out (faint). This information is not intended to replace advice given to you by your health care provider. Make sure you discuss any questions you have with your health care provider. Document Released: 03/28/2008 Document Revised: 01/07/2016 Document Reviewed: 12/06/2015 Elsevier Interactive Patient Education  2018 [employer]  Abdominal Pain During Pregnancy Belly (abdominal) pain is common during pregnancy. Most of the time, it is not a serious problem. Other times, it can be a sign that something is wrong with the pregnancy. Always tell your doctor if you have belly pain. Follow these instructions at home: Monitor your belly pain for any changes. The following actions may help you feel better:  Do not have sex (intercourse) or put anything in your vagina until you feel better.  Rest until your pain stops.  Drink clear fluids if you feel sick to your stomach (nauseous). Do not eat solid food until you feel better.  Only take medicine as told by your doctor.  Keep all doctor visits as told.  Get help right away if:  You are bleeding, leaking fluid, or pieces of tissue come out of your vagina.  You have more pain or cramping.  You keep throwing up (vomiting).  You have pain when you pee (urinate) or have blood in your pee.  You have a fever.  You do not feel  your baby moving as much.  You feel very weak or feel like passing out.  You have trouble breathing, with or without belly pain.  You have a very bad headache and belly pain.  You have fluid leaking from your vagina and belly pain.  You keep having watery poop (diarrhea).  Your belly pain does not go away after resting, or the pain gets worse. This information is not intended to replace advice given to you by your health care provider. Make sure you discuss any questions you have with your health care  provider. Document Released: 06/07/2009 Document Revised: 01/26/2016 Document Reviewed: 01/16/2013 Elsevier Interactive Patient Education  [employer].

## 2018-03-15 NOTE — MAU Provider Note (Addendum)
History     CSN: [id number]  Arrival date and time: 03/15/18 4098   First Provider Initiated Contact with Patient 03/15/18 1349      Chief Complaint  Patient presents with  . Abdominal Pain  . Back Pain  . Shoulder Pain    Abdominal Pain  The primary symptoms of the illness include abdominal pain and nausea. The primary symptoms of the illness do not include fever, shortness of breath, vomiting or vaginal bleeding.  Additional symptoms associated with the illness include constipation and back pain. Symptoms associated with the illness do not include chills.  Back Pain   Associated symptoms include headaches and abdominal pain. Pertinent negatives include no fever.  Shoulder Pain  Associated symptoms include abdominal pain, headaches and nausea. Pertinent negatives include no chills, fever or vomiting.      Past Medical History:  Diagnosis Date  . Anxiety   . Depression   . Panic attack     Past Surgical History:  Procedure Laterality Date  . ABSCESS DRAINAGE    . CESAREAN SECTION      History reviewed. No pertinent family history.  Social History   Tobacco Use  . Smoking status: Former Smoker    Packs/day: 0.25    Types: Cigarettes  . Smokeless tobacco: Never Used  . Tobacco comment: 2/day  Substance Use Topics  . Alcohol use: No  . Drug use: Yes    Types: Marijuana    Comment: last marijuana use beginning of July/2019    Allergies: No Known Allergies  Medications Prior to Admission  Medication Sig Dispense Refill Last Dose  . acetaminophen (TYLENOL) 500 MG tablet Take 1,000 mg by mouth every 8 (eight) hours as needed for mild pain.   Taking  . cyclobenzaprine (FLEXERIL) 10 MG tablet Take 1 tablet (10 mg total) by mouth 2 (two) times daily as needed for muscle spasms. 30 tablet 0 Taking  . Prenatal Vit-Fe Fumarate-FA (PRENATAL MULTIVITAMIN) TABS tablet Take 1 tablet by mouth daily at 12 noon.   Taking  . promethazine (PHENERGAN) 12.5 MG tablet Take 1  tablet (12.5 mg total) by mouth every 8 (eight) hours as needed for nausea or vomiting. 30 tablet 0 Taking  . zolpidem (AMBIEN) 5 MG tablet Take 1 tablet (5 mg total) by mouth at bedtime as needed for sleep. 12 tablet 1 Taking    Review of Systems  Constitutional: Negative for appetite change, chills and fever.  Eyes: Negative for pain and visual disturbance.  Respiratory: Negative for shortness of breath and wheezing.   Gastrointestinal: Positive for abdominal pain, constipation and nausea. Negative for vomiting.  Genitourinary: Negative for vaginal bleeding.  Musculoskeletal: Positive for back pain.  Neurological: Positive for headaches. Negative for speech difficulty.   Physical Exam   Blood pressure 109/66, pulse 85, temperature 98.6 F (37 C), temperature source Oral, resp. rate 16, weight 60.2 kg, last menstrual period 11/22/2017, SpO2 100 %, unknown if currently breastfeeding.  Physical Exam  Constitutional: She is cooperative. She appears distressed.  Eyes: Pupils are equal, round, and reactive to light. Right eye exhibits no discharge. Left eye exhibits no discharge.  Neck: No JVD present. Carotid bruit is not present.  Cardiovascular: Normal rate and regular rhythm.  Respiratory: Breath sounds normal. No respiratory distress. She has no wheezes. She exhibits tenderness.  GI: There is tenderness in the suprapubic area.  Musculoskeletal: She exhibits tenderness.       Right shoulder: She exhibits pain.  Lumbar back: She exhibits pain.  Neurological: She is alert. She displays no tremor.  Skin: Skin is warm and dry.  Psychiatric: Her speech is normal. Cognition and memory are normal.  Patient is tearful     MAU Course  Procedures Fioricet   MDM Labs ordered No evidence of head fracture  Neuro exam normal  HA improved after fioricet  Pain likely MSK.  Stable for discharge    Assessment and Plan  15.[redacted] weeks gestation  HA Fall MSK back pain    PLAN [facility]charge home [facility]  Rx Flexeril RX Fioricet    Darcel Zick K Arelia Volpe 03/15/2018, 2:21 PM

## 2018-03-15 NOTE — MAU Note (Addendum)
Having real bad cramps. Shoulders been hurting real bad. Having headaches. Took as much Tylenol as she can.  Pain is severe.  Had fever 101 at 0400. Took 1000mg  of Tylenol, didn't feel any better, but temp was down.  States, " I know what is wrong, body just needs some rest"

## 2018-03-18 LAB — GC/CHLAMYDIA PROBE AMP (~~LOC~~) NOT AT ARMC
Chlamydia: NEGATIVE
Neisseria Gonorrhea: NEGATIVE

## 2018-03-20 ENCOUNTER — Ambulatory Visit (INDEPENDENT_AMBULATORY_CARE_PROVIDER_SITE_OTHER): Payer: BLUE CROSS/BLUE SHIELD | Admitting: Family Medicine

## 2018-03-20 VITALS — BP 106/72 | HR 77 | Wt 135.6 lb

## 2018-03-20 DIAGNOSIS — Z3480 Encounter for supervision of other normal pregnancy, unspecified trimester: Secondary | ICD-10-CM

## 2018-03-20 DIAGNOSIS — Z720 Tobacco use: Secondary | ICD-10-CM

## 2018-03-20 DIAGNOSIS — G4700 Insomnia, unspecified: Secondary | ICD-10-CM

## 2018-03-20 DIAGNOSIS — Z23 Encounter for immunization: Secondary | ICD-10-CM
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
## 2018-03-20 NOTE — Patient Instructions (Signed)
Try 50mg  of Benadryl to help with sleeping or  Ambien

## 2018-03-21 ENCOUNTER — Encounter: Payer: Self-pay | Admitting: *Deleted

## 2018-03-22 ENCOUNTER — Ambulatory Visit: Payer: Self-pay

## 2018-03-22 DIAGNOSIS — Z720 Tobacco use: Secondary | ICD-10-CM | POA: Insufficient documentation

## 2018-03-22 MED ORDER — NICOTINE POLACRILEX 2 MG MT GUM: 2.0000 mg | CHEWING_GUM | OROMUCOSAL | 5 refills | Status: DC | PRN

## 2018-03-22 MED ORDER — NICOTINE 21 MG/24HR TD PT24: 21.0000 mg | MEDICATED_PATCH | Freq: Every day | TRANSDERMAL | 5 refills | Status: DC

## 2018-03-22 MED ORDER — ZOLPIDEM TARTRATE 5 MG PO TABS: 5.0000 mg | ORAL_TABLET | Freq: Every evening | ORAL | 0 refills | Status: DC | PRN

## 2018-04-09 ENCOUNTER — Ambulatory Visit (HOSPITAL_COMMUNITY)
Admission: RE | Admit: 2018-04-09 | Discharge: 2018-04-09 | Disposition: A | Discharge status: Home / Self Care | Payer: BLUE CROSS/BLUE SHIELD | Source: Ambulatory Visit | Attending: Obstetrics and Gynecology | Admitting: Obstetrics and Gynecology

## 2018-04-09 DIAGNOSIS — Z363 Encounter for antenatal screening for malformations: Secondary | ICD-10-CM | POA: Diagnosis not present

## 2018-04-09 DIAGNOSIS — Z3482 Encounter for supervision of other normal pregnancy, second trimester: Secondary | ICD-10-CM | POA: Diagnosis not present

## 2018-04-09 DIAGNOSIS — Z3A19 19 weeks gestation of pregnancy: Secondary | ICD-10-CM

## 2018-04-09 DIAGNOSIS — Z3480 Encounter for supervision of other normal pregnancy, unspecified trimester: Secondary | ICD-10-CM

## 2018-04-09 DIAGNOSIS — O34219 Maternal care for unspecified type scar from previous cesarean delivery: Secondary | ICD-10-CM | POA: Diagnosis not present

## 2018-04-12 ENCOUNTER — Inpatient Hospital Stay (HOSPITAL_COMMUNITY)
Admission: AD | Admit: 2018-04-12 | Discharge: 2018-04-12 | Disposition: A | Discharge status: Home / Self Care | Payer: BLUE CROSS/BLUE SHIELD | Source: Ambulatory Visit | Attending: Family Medicine | Admitting: Family Medicine

## 2018-04-12 ENCOUNTER — Inpatient Hospital Stay (HOSPITAL_COMMUNITY): Payer: BLUE CROSS/BLUE SHIELD

## 2018-04-12 ENCOUNTER — Encounter (HOSPITAL_COMMUNITY): Payer: Self-pay | Admitting: *Deleted

## 2018-04-12 DIAGNOSIS — Z87891 Personal history of nicotine dependence: Secondary | ICD-10-CM | POA: Insufficient documentation

## 2018-04-12 DIAGNOSIS — K5641 Fecal impaction: Secondary | ICD-10-CM

## 2018-04-12 DIAGNOSIS — Z3A19 19 weeks gestation of pregnancy: Secondary | ICD-10-CM

## 2018-04-12 DIAGNOSIS — R0789 Other chest pain: Secondary | ICD-10-CM

## 2018-04-12 DIAGNOSIS — Z3480 Encounter for supervision of other normal pregnancy, unspecified trimester: Secondary | ICD-10-CM

## 2018-04-12 DIAGNOSIS — O26892 Other specified pregnancy related conditions, second trimester: Secondary | ICD-10-CM

## 2018-04-12 DIAGNOSIS — R109 Unspecified abdominal pain: Secondary | ICD-10-CM

## 2018-04-12 DIAGNOSIS — R0602 Shortness of breath: Secondary | ICD-10-CM | POA: Diagnosis not present

## 2018-04-12 DIAGNOSIS — R079 Chest pain, unspecified: Secondary | ICD-10-CM

## 2018-04-12 LAB — COMPREHENSIVE METABOLIC PANEL
ALBUMIN: 3.3 g/dL — AB (ref 3.5–5.0)
ALK PHOS: 52 U/L (ref 38–126)
ALT: 16 U/L (ref 0–44)
AST: 18 U/L (ref 15–41)
Anion gap: 8 (ref 5–15)
BUN: 8 mg/dL (ref 6–20)
CHLORIDE: 106 mmol/L (ref 98–111)
CO2: 20 mmol/L — AB (ref 22–32)
CREATININE: 0.42 mg/dL — AB (ref 0.44–1.00)
Calcium: 9 mg/dL (ref 8.9–10.3)
GFR calc Af Amer: >60 mL/min (ref >60)
GFR calc non Af Amer: >60 mL/min (ref >60)
GLUCOSE: 85 mg/dL (ref 70–99)
Potassium: 3.8 mmol/L (ref 3.5–5.1)
Sodium: 134 mmol/L — ABNORMAL LOW (ref 135–145)
TOTAL PROTEIN: 6.2 g/dL — AB (ref 6.5–8.1)
Total Bilirubin: 0.6 mg/dL (ref 0.3–1.2)

## 2018-04-12 LAB — CBC
HCT: 30.8 % — ABNORMAL LOW (ref 36.0–46.0)
HEMOGLOBIN: 10.8 g/dL — AB (ref 12.0–15.0)
MCH: 33.3 pg (ref 26.0–34.0)
MCHC: 35.1 g/dL (ref 30.0–36.0)
MCV: 95.1 fL (ref 80.0–100.0)
NRBC: 0 % (ref 0.0–0.2)
PLATELETS: 218 10*3/uL (ref 150–400)
RBC: 3.24 MIL/uL — AB (ref 3.87–5.11)
RDW: 13.6 % (ref 11.5–15.5)
WBC: 6.1 10*3/uL (ref 4.0–10.5)

## 2018-04-12 LAB — URINALYSIS, ROUTINE W REFLEX MICROSCOPIC
Bilirubin Urine: NEGATIVE
GLUCOSE, UA: NEGATIVE mg/dL
Hgb urine dipstick: NEGATIVE
KETONES UR: NEGATIVE mg/dL
Leukocytes, UA: NEGATIVE
Nitrite: NEGATIVE
PH: 7 (ref 5.0–8.0)
PROTEIN: NEGATIVE mg/dL
Specific Gravity, Urine: 1.009 (ref 1.005–1.030)

## 2018-04-12 MED ORDER — POLYETHYLENE GLYCOL 3350 17 G PO PACK: 17.0000 g | PACK | Freq: Two times a day (BID) | ORAL | 0 refills | Status: DC

## 2018-04-12 MED ORDER — LACTATED RINGERS IV BOLUS
1000.0000 mL | Freq: Once | INTRAVENOUS | Status: AC
  Administered 2018-04-12: 1000 mL via INTRAVENOUS

## 2018-04-12 MED ORDER — BISACODYL 10 MG RE SUPP: 10.0000 mg | Freq: Every day | RECTAL | 1 refills | Status: DC

## 2018-04-12 NOTE — MAU Provider Note (Signed)
History     CSN: [id number]  Arrival date and time: 04/12/18 1056   First Provider Initiated Contact with Patient 04/12/18 1126      Chief Complaint  Patient presents with  . Abdominal Pain  . Emesis   HPI This is a 27 yo G4P1021 at [redacted]w[redacted]d.  Pregnancy comp located by cesarean section.  Presents with 2 days of worsening lower abdominal pain, vomiting that began as intermittent and progressed to constant pain since this morning. No radiation of pain. Is constipated and hasn't had a BM for a week. Denies vaginal discharge, bleeding, contractions.  OB History    Gravida  4   Para  1   Term  1   Preterm  0   AB  2   Living  1     SAB  2   TAB  0   Ectopic  0   Multiple  0   Live Births  1           Past Medical History:  Diagnosis Date  . Anxiety   . Depression   . Panic attack     Past Surgical History:  Procedure Laterality Date  . ABSCESS DRAINAGE    . CESAREAN SECTION      History reviewed. No pertinent family history.  Social History   Tobacco Use  . Smoking status: Former Smoker    Packs/day: 0.25    Types: Cigarettes  . Smokeless tobacco: Never Used  . Tobacco comment: 2/day  Substance Use Topics  . Alcohol use: No  . Drug use: Yes    Types: Marijuana    Comment: last marijuana use beginning of July/2019    Allergies: No Known Allergies  Medications Prior to Admission  Medication Sig Dispense Refill Last Dose  . butalbital-acetaminophen-caffeine (FIORICET, ESGIC) 50-325-40 MG tablet Take 1-2 tablets by mouth every 6 (six) hours as needed for headache or migraine. 14 tablet 0 Taking  . cyclobenzaprine (FLEXERIL) 10 MG tablet Take 1 tablet (10 mg total) by mouth 3 (three) times daily as needed for muscle spasms. 20 tablet 0 Taking  . nicotine (NICODERM CQ - DOSED IN MG/24 HOURS) 21 mg/24hr patch Place 1 patch (21 mg total) onto the skin daily. 28 patch 5   . nicotine polacrilex (NICORETTE) 2 MG gum Take 1 each (2 mg total) by mouth  as needed for smoking cessation. 100 tablet 5   . Prenatal Vit-Fe Fumarate-FA (PRENATAL MULTIVITAMIN) TABS tablet Take 1 tablet by mouth daily at 12 noon.   Taking  . promethazine (PHENERGAN) 12.5 MG tablet Take 1 tablet (12.5 mg total) by mouth every 8 (eight) hours as needed for nausea or vomiting. 30 tablet 0 Taking  . zolpidem (AMBIEN) 5 MG tablet Take 1 tablet (5 mg total) by mouth at bedtime as needed for sleep. 10 tablet 0     Review of Systems Physical Exam   Blood pressure 111/66, pulse 86, temperature 98.4 F (36.9 C), temperature source Oral, resp. rate 15, height 5\' 3"  (1.6 m), weight 63 kg, last menstrual period 11/22/2017, SpO2 100 %, unknown if currently breastfeeding.  Physical Exam  Constitutional: She is oriented to person, place, and time. She appears well-developed and well-nourished.  HENT:  Head: Normocephalic and atraumatic.  Right Ear: External ear normal.  Left Ear: External ear normal.  Eyes: Pupils are equal, round, and reactive to light.  Neck: Normal range of motion. Neck supple.  Cardiovascular: Normal rate, regular rhythm and normal heart sounds.  Respiratory: Effort normal and breath sounds normal. No respiratory distress. She has no wheezes. She has no rales.  GI: Soft. Bowel sounds are normal. She exhibits no distension. There is tenderness (LLQ). There is no rebound and no guarding.  Neurological: She is alert and oriented to person, place, and time.  Skin: Skin is warm and dry.  Psychiatric: She has a normal mood and affect. Her behavior is normal. Judgment and thought content normal.   Results for orders placed or performed during the hospital encounter of 04/12/18 (from the past 24 hour(s))  Urinalysis, Routine w reflex microscopic     Status: Abnormal   Collection Time: 04/12/18 11:28 AM  Result Value Ref Range   Color, Urine STRAW (A) YELLOW   APPearance CLEAR CLEAR   Specific Gravity, Urine 1.009 1.005 - 1.030   pH 7.0 5.0 - 8.0   Glucose,  UA NEGATIVE NEGATIVE mg/dL   Hgb urine dipstick NEGATIVE NEGATIVE   Bilirubin Urine NEGATIVE NEGATIVE   Ketones, ur NEGATIVE NEGATIVE mg/dL   Protein, ur NEGATIVE NEGATIVE mg/dL   Nitrite NEGATIVE NEGATIVE   Leukocytes, UA NEGATIVE NEGATIVE  CBC     Status: Abnormal   Collection Time: 04/12/18 12:06 PM  Result Value Ref Range   WBC 6.1 4.0 - 10.5 K/uL   RBC 3.24 (L) 3.87 - 5.11 MIL/uL   Hemoglobin 10.8 (L) 12.0 - 15.0 g/dL   HCT 16.1 (L) 09.6 - 04.5 %   MCV 95.1 80.0 - 100.0 fL   MCH 33.3 26.0 - 34.0 pg   MCHC 35.1 30.0 - 36.0 g/dL   RDW 40.9 81.1 - 91.4 %   Platelets 218 150 - 400 K/uL   nRBC 0.0 0.0 - 0.2 %  Comprehensive metabolic panel     Status: Abnormal   Collection Time: 04/12/18 12:06 PM  Result Value Ref Range   Sodium 134 (L) 135 - 145 mmol/L   Potassium 3.8 3.5 - 5.1 mmol/L   Chloride 106 98 - 111 mmol/L   CO2 20 (L) 22 - 32 mmol/L   Glucose, Bld 85 70 - 99 mg/dL   BUN 8 6 - 20 mg/dL   Creatinine, Ser 7.82 (L) 0.44 - 1.00 mg/dL   Calcium 9.0 8.9 - 95.6 mg/dL   Total Protein 6.2 (L) 6.5 - 8.1 g/dL   Albumin 3.3 (L) 3.5 - 5.0 g/dL   AST 18 15 - 41 U/L   ALT 16 0 - 44 U/L   Alkaline Phosphatase 52 38 - 126 U/L   Total Bilirubin 0.6 0.3 - 1.2 mg/dL   GFR calc non Af Amer >60 >60 mL/min   GFR calc Af Amer >60 >60 mL/min   Anion gap 8 5 - 15   Dg Chest 1 View  Result Date: 04/12/2018 CLINICAL DATA:  [redacted]wks pregnant with nausea and vomiting with last 4 hours, mid chest pain, abdominal discomfort and shortness of breath, smokes less than 1/2 pk/day, prior 2v chest x-ray 02/13/2017 EXAM: CHEST  1 VIEW COMPARISON:  A 14 18 FINDINGS: The heart size and mediastinal contours are within normal limits. Both lungs are clear. The visualized skeletal structures are unremarkable. IMPRESSION: No active disease. Electronically Signed   By: Norva Pavlov M.D.   On: 04/12/2018 14:07   Dg Abd 1 View  Result Date: 04/12/2018 CLINICAL DATA:  Pt is [redacted]wks pregnant with nausea and  vomiting which started about 4 hours ago and abd discomfort for 3days. Large artifact hidden underneath gown shows up on  Rt side of abdomen(card with visible ring and large plastic baby pin) EXAM: ABDOMEN - 1 VIEW COMPARISON:  None applicable FINDINGS: Bowel gas pattern is nonobstructed. There is a significant stool burden throughout nondilated loops of colon. The appearance is compatible with fecal impaction. No small bowel dilatation. No evidence for organomegaly. No free intraperitoneal air. Lung bases are clear. Artifact external to the patient overlies the RIGHT UPPER QUADRANT. IMPRESSION: Significant stool burden, consistent with fecal impaction. No evidence for acute abnormality. Electronically Signed   By: Norva Pavlov M.D.   On: 04/12/2018 14:07     MAU Course  Procedures   Assessment and Plan  1. Fecal impaction 2. [redacted] weeks gestation  IVF 1L bolus given with improvement. Miralax BID and bisacodyl daily. RTC for n/v, severe abdominal pain. F/u outpatient   Levie Heritage 04/12/2018, 11:41 AM

## 2018-04-12 NOTE — Discharge Instructions (Signed)

## 2018-04-12 NOTE — MAU Note (Signed)
Pt reports pain started after U/S on 04/09/18

## 2018-04-12 NOTE — MAU Note (Signed)
Pt reports she has been vomiting for the last 2 hours, states she has had lower abd pressure x 3 days but it worsened today.

## 2018-04-15 ENCOUNTER — Telehealth: Payer: Self-pay | Admitting: General Practice

## 2018-04-15 NOTE — Telephone Encounter (Signed)
Patient called & left message on nurse voicemail line requesting results from ultrasound. Called patient & informed her of normal anatomy scan. Patient verbalized understanding & had no questions.

## 2018-04-17 ENCOUNTER — Ambulatory Visit (INDEPENDENT_AMBULATORY_CARE_PROVIDER_SITE_OTHER): Payer: BLUE CROSS/BLUE SHIELD | Admitting: Nurse Practitioner

## 2018-04-17 ENCOUNTER — Encounter: Payer: Self-pay | Admitting: Nurse Practitioner

## 2018-04-17 VITALS — BP 116/70 | HR 99 | Wt 140.7 lb

## 2018-04-17 DIAGNOSIS — Z3480 Encounter for supervision of other normal pregnancy, unspecified trimester: Secondary | ICD-10-CM

## 2018-04-17 DIAGNOSIS — O34219 Maternal care for unspecified type scar from previous cesarean delivery: Secondary | ICD-10-CM

## 2018-04-17 DIAGNOSIS — K5900 Constipation, unspecified: Secondary | ICD-10-CM | POA: Insufficient documentation

## 2018-04-17 DIAGNOSIS — F411 Generalized anxiety disorder: Secondary | ICD-10-CM

## 2018-04-17 DIAGNOSIS — Z720 Tobacco use: Secondary | ICD-10-CM

## 2018-04-17 MED ORDER — BUTALBITAL-APAP-CAFFEINE 50-325-40 MG PO TABS: 1.0000 | ORAL_TABLET | Freq: Four times a day (QID) | ORAL | 0 refills | Status: DC | PRN

## 2018-04-17 NOTE — Patient Instructions (Addendum)
Drink 64 ounces or 8 one cup measures of water every day.   General Headache Without Cause A headache is pain or discomfort felt around the head or neck area. There are many causes and types of headaches. In some cases, the cause may not be found. Follow these instructions at home: Managing pain  Take over-the-counter and prescription medicines only as told by your doctor.  Lie down in a dark, quiet room when you have a headache.  If directed, apply ice to the head and neck area: ? Put ice in a plastic bag. ? Place a towel between your skin and the bag. ? Leave the ice on for 20 minutes, 2-3 times per day.  Use a heating pad or hot shower to apply heat to the head and neck area as told by your doctor.  Keep lights dim if bright lights bother you or make your headaches worse. Eating and drinking  Eat meals on a regular schedule.  Lessen how much alcohol you drink.  Lessen how much caffeine you drink, or stop drinking caffeine. General instructions  Keep all follow-up visits as told by your doctor. This is important.  Keep a journal to find out if certain things bring on headaches. For example, write down: ? What you eat and drink. ? How much sleep you get. ? Any change to your diet or medicines.  Relax by getting a massage or doing other relaxing activities.  Lessen stress.  Sit up straight. Do not tighten (tense) your muscles.  Do not use tobacco products. This includes cigarettes, chewing tobacco, or e-cigarettes. If you need help quitting, ask your doctor.  Exercise regularly as told by your doctor.  Get enough sleep. This often means 7-9 hours of sleep. Contact a doctor if:  Your symptoms are not helped by medicine.  You have a headache that feels different than the other headaches.  You feel sick to your stomach (nauseous) or you throw up (vomit).  You have a fever. Get help right away if:  Your headache becomes really bad.  You keep throwing up.  You  have a stiff neck.  You have trouble seeing.  You have trouble speaking.  You have pain in the eye or ear.  Your muscles are weak or you lose muscle control.  You lose your balance or have trouble walking.  You feel like you will pass out (faint) or you pass out.  You have confusion. This information is not intended to replace advice given to you by your health care provider. Make sure you discuss any questions you have with your health care provider. Document Released: 03/28/2008 Document Revised: 11/25/2015 Document Reviewed: 10/12/2014 Elsevier Interactive Patient Education  [employer].

## 2018-04-17 NOTE — Progress Notes (Signed)
    Subjective:  Sabrina Rasmussen is a 27 y.o. G4P1021 at [redacted]w[redacted]d being seen today for ongoing prenatal care.  She is currently monitored for the following issues for this low-risk pregnancy and has Major depressive disorder with single episode; GAD (generalized anxiety disorder); Supervision of other normal pregnancy, antepartum; Previous cesarean delivery affecting pregnancy; Tobacco use; and Constipation on their problem list.  Patient reports headaches and is pushing heavy loads at work.  Still has some constipation..  Contractions: Not present. Vag. Bleeding: None.  Movement: Present. Denies leaking of fluid.   The following portions of the patient's history were reviewed and updated as appropriate: allergies, current medications, past family history, past medical history, past social history, past surgical history and problem list. Problem list updated.  Objective:   Vitals:   04/17/18 1622  BP: 116/70  Pulse: 99  Weight: 140 lb 11.2 oz (63.8 kg)    Fetal Status: Fetal Heart Rate (bpm): 147 Fundal Height: 21 cm Movement: Present     General:  Alert, oriented and cooperative. Patient is in no acute distress.  Skin: Skin is warm and dry. No rash noted.   Cardiovascular: Normal heart rate noted  Respiratory: Normal respiratory effort, no problems with respiration noted  Abdomen: Soft, gravid, appropriate for gestational age. Pain/Pressure: Present     Pelvic:  Cervical exam deferred        Extremities: Normal range of motion.  Edema: None  Mental Status: Normal mood and affect. Normal behavior. Normal judgment and thought content.   Urinalysis:      Assessment and Plan:  Pregnancy: G4P1021 at [redacted]w[redacted]d  1. Supervision of other normal pregnancy, antepartum Is feeling the baby move.  2. Previous cesarean delivery affecting pregnancy Wants repeat CS - has not yet signed a delivery plan  3. GAD (generalized anxiety disorder) Not discussed at today's visit but has stopped seeing her  provider.  Talkative today.  4. Constipation, unspecified constipation type Using ducolax suppository but states it did not stay in.  Has not yet started Miralax.  Has had 2 hard stools since being at [facility]U.  Advised to begin Miralax daily and instructions given on how to use the Ducolax suppository tonight.   5. Headaches - has used 14 tablets of Fioricet in one month.  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  But does not drink enough water and skips breakfast.  Advised good protein for breakfast - string cheese or peanuts.  Advised 4 glasses of water by 12 noon and 4 glasses by 5 pm.  Given note for work with usual pregnancy restrictions - frequent bathroom breaks, drinking water and limited to lifting 25 pounds.    6. Smoker - smokes one pack every 4 days not - not using patches.  Will have [facility] provider see client at her next visit to help her know how to use her nicotine gum and patches.  Preterm labor symptoms and general obstetric precautions including but not limited to vaginal bleeding, contractions, leaking of fluid and fetal movement were reviewed in detail with the patient. Please refer to After Visit Summary for other counseling recommendations.  Return in about 4 weeks (around 05/15/2018).  Nolene Bernheim, RN, MSN, NP-BC Nurse Practitioner, [facility] for [employer], [facility] 04/17/2018 4:52 PM

## 2018-04-23 ENCOUNTER — Telehealth: Payer: Self-pay

## 2018-04-23 NOTE — Telephone Encounter (Signed)
Pt is calling because we put her on restrictions for her job and her job refuses to follow the requirements. She states that she has FMLA paperwork in our office that is being filled out and she doesn't know what to do to be able to actually go to work. I advised the pt that our front desk staff that takes care of that paperwork returns tomorrow and I will have her give her a call because the pt states she just needs to have a conversation about the wording in the paperwork to see if that could help. Advised pt I would make sure someone calls her back. Pt verbalized understanding.

## 2018-04-25 ENCOUNTER — Telehealth: Payer: Self-pay | Admitting: General Practice

## 2018-04-25 NOTE — Telephone Encounter (Signed)
Patient called and left message on nurse voicemail line requesting a call back regarding job restrictions that her job isn't being compliant with.

## 2018-04-26 ENCOUNTER — Encounter: Payer: Self-pay | Admitting: General Practice

## 2018-04-26 NOTE — Progress Notes (Signed)
Patient came by the office this morning to discuss concerns with FMLA paperwork. Patient states after she gave her job the restrictions letter they told her to leave work and to not come back until [employer] paperwork was completed for short term disability. Patient was very upset/emotional. Patient states her mother just died yesterday and she knows she shouldn't be worrying about all of this but she is concerned about losing her job & has bills to pay and children to take care of. Discussed with patient she has a healthy normal pregnancy and those restrictions would be advised to all pregnant women. Discussed FMLA paperwork means there is a medical reason she cannot work at all in any capacity which is not true of her at this time. Discussed her and her employer should work something out on their own to be able to accommodate those restrictions. Discussed her job cannot fire her due to her pregnancy/restrictions as that would be discrimination. Recommended she talk with her employer about the situation and discuss we are not removing her medically from work just advising of job restrictions. Discussed if something changes like a minor illness and we need to remove her from work for a couple days we can always addend her FMLA papers at that time. Patient verbalized understanding to all & appreciated support/discussion today. Patient had no questions.

## 2018-04-26 NOTE — Telephone Encounter (Signed)
Patient came by office and concerns were addressed in person.

## 2018-05-15 ENCOUNTER — Ambulatory Visit (INDEPENDENT_AMBULATORY_CARE_PROVIDER_SITE_OTHER): Payer: BLUE CROSS/BLUE SHIELD | Admitting: Obstetrics and Gynecology

## 2018-05-15 ENCOUNTER — Encounter: Payer: Self-pay | Admitting: Obstetrics and Gynecology

## 2018-05-15 ENCOUNTER — Encounter: Payer: Self-pay | Admitting: Nurse Practitioner

## 2018-05-15 VITALS — BP 121/76 | HR 88 | Wt 149.0 lb

## 2018-05-15 DIAGNOSIS — Z720 Tobacco use: Secondary | ICD-10-CM

## 2018-05-15 DIAGNOSIS — Z3480 Encounter for supervision of other normal pregnancy, unspecified trimester: Secondary | ICD-10-CM

## 2018-05-15 DIAGNOSIS — O34219 Maternal care for unspecified type scar from previous cesarean delivery: Secondary | ICD-10-CM

## 2018-05-15 DIAGNOSIS — Z3482 Encounter for supervision of other normal pregnancy, second trimester: Secondary | ICD-10-CM

## 2018-05-15 MED ORDER — NICOTINE 21 MG/24HR TD PT24: 21.0000 mg | MEDICATED_PATCH | Freq: Every day | TRANSDERMAL | 5 refills | Status: DC

## 2018-05-15 MED ORDER — VITAFOL GUMMIES 3.33-0.333-34.8 MG PO CHEW: 2.0000 | CHEWABLE_TABLET | Freq: Every day | ORAL | 6 refills | Status: AC

## 2018-05-15 MED ORDER — CYCLOBENZAPRINE HCL 10 MG PO TABS: 10.0000 mg | ORAL_TABLET | Freq: Three times a day (TID) | ORAL | 2 refills | Status: DC | PRN

## 2018-05-15 NOTE — Progress Notes (Signed)
.     PRENATAL VISIT NOTE  Subjective:  Sabrina Rasmussen is a 27 y.o. G4P1021 at 3452w1d being seen today for ongoing prenatal care.  She is currently monitored for the following issues for this low-risk pregnancy and has Major depressive disorder with single episode; GAD (generalized anxiety disorder); Supervision of other normal pregnancy, antepartum; Previous cesarean delivery affecting pregnancy; Tobacco use; and Constipation on their problem list.  Patient reports headache not relieved by fioricet.  Contractions: Not present. Vag. Bleeding: None.  Movement: Present. Denies leaking of fluid.   The following portions of the patient's history were reviewed and updated as appropriate: allergies, current medications, past family history, past medical history, past social history, past surgical history and problem list. Problem list updated.  Objective:   Vitals:   05/15/18 0932  BP: 121/76  Pulse: 88  Weight: 149 lb (67.6 kg)    Fetal Status: Fetal Heart Rate (bpm): 145 Fundal Height: 24 cm Movement: Present     General:  Alert, oriented and cooperative. Patient is in no acute distress.  Skin: Skin is warm and dry. No rash noted.   Cardiovascular: Normal heart rate noted  Respiratory: Normal respiratory effort, no problems with respiration noted  Abdomen: Soft, gravid, appropriate for gestational age.  Pain/Pressure: Present     Pelvic: Cervical exam deferred        Extremities: Normal range of motion.     Mental Status: Normal mood and affect. Normal behavior. Normal judgment and thought content.   Assessment and Plan:  Pregnancy: G4P1021 at 6152w1d  1. Supervision of other normal pregnancy, antepartum Patient is doing well Will refer to headache specialist Third trimester labs next visit  2. Previous cesarean delivery affecting pregnancy Patient desires repeat c-section  3. Tobacco use Nicotine patch provided - nicotine (NICODERM CQ - DOSED IN MG/24 HOURS) 21 mg/24hr patch;  Place 1 patch (21 mg total) onto the skin daily.  Dispense: 28 patch; Refill: 5  Preterm labor symptoms and general obstetric precautions including but not limited to vaginal bleeding, contractions, leaking of fluid and fetal movement were reviewed in detail with the patient. Please refer to After Visit Summary for other counseling recommendations.  Return in about 4 weeks (around 06/12/2018) for ROB, 2 hr glucola next visit.  Future Appointments  Date Time Provider Department Center  06/17/2018  8:00 AM [facility] None  06/17/2018  8:15 AM Yeudiel Mateo, Gigi GinPeggy, MD [facility] None    Catalina AntiguaPeggy Loretto Belinsky, MD

## 2018-05-20 ENCOUNTER — Telehealth: Payer: Self-pay

## 2018-05-20 NOTE — Telephone Encounter (Signed)
Attempted to contact about paper work that was dropped off. Both numbers disconnected, unable to leave vm.

## 2018-06-17 ENCOUNTER — Ambulatory Visit (INDEPENDENT_AMBULATORY_CARE_PROVIDER_SITE_OTHER): Payer: BLUE CROSS/BLUE SHIELD | Admitting: Obstetrics and Gynecology

## 2018-06-17 ENCOUNTER — Encounter (HOSPITAL_COMMUNITY): Payer: Self-pay

## 2018-06-17 ENCOUNTER — Other Ambulatory Visit: Payer: BLUE CROSS/BLUE SHIELD

## 2018-06-17 ENCOUNTER — Encounter: Payer: Self-pay | Admitting: Obstetrics and Gynecology

## 2018-06-17 VITALS — BP 116/78 | HR 91 | Wt 149.0 lb

## 2018-06-17 DIAGNOSIS — Z3480 Encounter for supervision of other normal pregnancy, unspecified trimester: Secondary | ICD-10-CM

## 2018-06-17 DIAGNOSIS — O34219 Maternal care for unspecified type scar from previous cesarean delivery: Secondary | ICD-10-CM

## 2018-06-17 DIAGNOSIS — Z3483 Encounter for supervision of other normal pregnancy, third trimester: Secondary | ICD-10-CM

## 2018-06-17 NOTE — Progress Notes (Signed)
   PRENATAL VISIT NOTE  Subjective:  Sabrina Rasmussen is a 27 y.o. G4P1021 at 1534w6d being seen today for ongoing prenatal care.  She is currently monitored for the following issues for this low-risk pregnancy and has Major depressive disorder with single episode; GAD (generalized anxiety disorder); Supervision of other normal pregnancy, antepartum; Previous cesarean delivery affecting pregnancy; Tobacco use; and Constipation on their problem list.  Patient reports no complaints.  Contractions: Not present. Vag. Bleeding: None.  Movement: Present. Denies leaking of fluid.   The following portions of the patient's history were reviewed and updated as appropriate: allergies, current medications, past family history, past medical history, past social history, past surgical history and problem list. Problem list updated.  Objective:   Vitals:   06/17/18 0825  BP: 116/78  Pulse: 91  Weight: 149 lb (67.6 kg)    Fetal Status: Fetal Heart Rate (bpm): 154   Movement: Present     General:  Alert, oriented and cooperative. Patient is in no acute distress.  Skin: Skin is warm and dry. No rash noted.   Cardiovascular: Normal heart rate noted  Respiratory: Normal respiratory effort, no problems with respiration noted  Abdomen: Soft, gravid, appropriate for gestational age.  Pain/Pressure: Present     Pelvic: Cervical exam deferred        Extremities: Normal range of motion.  Edema: None  Mental Status: Normal mood and affect. Normal behavior. Normal judgment and thought content.   Assessment and Plan:  Pregnancy: G4P1021 at 7634w6d  1. Supervision of other normal pregnancy, antepartum Patient is doing well without complaints Third trimester labs today - Glucose Tolerance, 2 Hours w/1 Hour - CBC - RPR - HIV Antibody (routine testing w rflx)  2. Previous cesarean delivery affecting pregnancy Patient desires repeat Will be scheduled at 39 weeks  Preterm labor symptoms and general obstetric  precautions including but not limited to vaginal bleeding, contractions, leaking of fluid and fetal movement were reviewed in detail with the patient. Please refer to After Visit Summary for other counseling recommendations.  Return in about 2 weeks (around 07/01/2018) for ROB.  Future Appointments  Date Time Provider Department Center  06/21/2018  9:30 AM Glyn Adeeague Clark, Scot JunKaren E, PA-C [facility]    Catalina AntiguaPeggy Hazelle Woollard, MD

## 2018-06-17 NOTE — Progress Notes (Signed)
Tdap declined  

## 2018-06-18 LAB — HIV ANTIBODY (ROUTINE TESTING W REFLEX): HIV Screen 4th Generation wRfx: NONREACTIVE

## 2018-06-18 LAB — CBC
HEMATOCRIT: 32.9 % — AB (ref 34.0–46.6)
HEMOGLOBIN: 11.1 g/dL (ref 11.1–15.9)
MCH: 31.7 pg (ref 26.6–33.0)
MCHC: 33.7 g/dL (ref 31.5–35.7)
MCV: 94 fL (ref 79–97)
Platelets: 270 10*3/uL (ref 150–450)
RBC: 3.5 x10E6/uL — ABNORMAL LOW (ref 3.77–5.28)
RDW: 13.1 % (ref 12.3–15.4)
WBC: 7.9 10*3/uL (ref 3.4–10.8)

## 2018-06-18 LAB — GLUCOSE TOLERANCE, 2 HOURS W/ 1HR
Glucose, 1 hour: 120 mg/dL (ref 65–179)
Glucose, 2 hour: 104 mg/dL (ref 65–152)
Glucose, Fasting: 86 mg/dL (ref 65–91)

## 2018-06-18 LAB — RPR: RPR Ser Ql: NONREACTIVE

## 2018-06-21 ENCOUNTER — Institutional Professional Consult (permissible substitution): Payer: Self-pay | Admitting: Physician Assistant

## 2018-06-24 ENCOUNTER — Other Ambulatory Visit: Payer: Self-pay | Admitting: Family Medicine

## 2018-07-01 ENCOUNTER — Ambulatory Visit (INDEPENDENT_AMBULATORY_CARE_PROVIDER_SITE_OTHER): Payer: BLUE CROSS/BLUE SHIELD | Admitting: Advanced Practice Midwife

## 2018-07-01 VITALS — BP 110/66 | HR 88 | Wt 154.6 lb

## 2018-07-01 DIAGNOSIS — Z3480 Encounter for supervision of other normal pregnancy, unspecified trimester: Secondary | ICD-10-CM

## 2018-07-01 DIAGNOSIS — F419 Anxiety disorder, unspecified: Secondary | ICD-10-CM | POA: Insufficient documentation

## 2018-07-01 DIAGNOSIS — O99343 Other mental disorders complicating pregnancy, third trimester: Secondary | ICD-10-CM

## 2018-07-01 DIAGNOSIS — Z8659 Personal history of other mental and behavioral disorders: Secondary | ICD-10-CM

## 2018-07-01 DIAGNOSIS — O34219 Maternal care for unspecified type scar from previous cesarean delivery: Secondary | ICD-10-CM

## 2018-07-01 DIAGNOSIS — G479 Sleep disorder, unspecified: Secondary | ICD-10-CM

## 2018-07-01 DIAGNOSIS — O9989 Other specified diseases and conditions complicating pregnancy, childbirth and the puerperium: Secondary | ICD-10-CM

## 2018-07-01 DIAGNOSIS — Z3483 Encounter for supervision of other normal pregnancy, third trimester: Secondary | ICD-10-CM

## 2018-07-01 DIAGNOSIS — O99891 Other specified diseases and conditions complicating pregnancy: Secondary | ICD-10-CM

## 2018-07-01 MED ORDER — HYDROXYZINE HCL 25 MG PO TABS: 25.0000 mg | ORAL_TABLET | Freq: Three times a day (TID) | ORAL | 3 refills | Status: DC | PRN

## 2018-07-01 MED ORDER — FLUOXETINE HCL 20 MG PO TABS: 20.0000 mg | ORAL_TABLET | Freq: Every day | ORAL | 3 refills | Status: DC

## 2018-07-01 NOTE — Patient Instructions (Signed)
Third Trimester of Pregnancy The third trimester is from week 28 through week 40 (months 7 through 9). The third trimester is a time when the unborn baby (fetus) is growing rapidly. At the end of the ninth month, the fetus is about 20 inches in length and weighs 6-10 pounds. Body changes during your third trimester Your body will continue to go through many changes during pregnancy. The changes vary from woman to woman. During the third trimester:  Your weight will continue to increase. You can expect to gain 25-35 pounds (11-16 kg) by the end of the pregnancy.  You may begin to get stretch marks on your hips, abdomen, and breasts.  You may urinate more often because the fetus is moving lower into your pelvis and pressing on your bladder.  You may develop or continue to have heartburn. This is caused by increased hormones that slow down muscles in the digestive tract.  You may develop or continue to have constipation because increased hormones slow digestion and cause the muscles that push waste through your intestines to relax.  You may develop hemorrhoids. These are swollen veins (varicose veins) in the rectum that can itch or be painful.  You may develop swollen, bulging veins (varicose veins) in your legs.  You may have increased body aches in the pelvis, back, or thighs. This is due to weight gain and increased hormones that are relaxing your joints.  You may have changes in your hair. These can include thickening of your hair, rapid growth, and changes in texture. Some women also have hair loss during or after pregnancy, or hair that feels dry or thin. Your hair will most likely return to normal after your baby is born.  Your breasts will continue to grow and they will continue to become tender. A yellow fluid (colostrum) may leak from your breasts. This is the first milk you are producing for your baby.  Your belly button may stick out.  You may notice more swelling in your hands,  face, or ankles.  You may have increased tingling or numbness in your hands, arms, and legs. The skin on your belly may also feel numb.  You may feel short of breath because of your expanding uterus.  You may have more problems sleeping. This can be caused by the size of your belly, increased need to urinate, and an increase in your body's metabolism.  You may notice the fetus "dropping," or moving lower in your abdomen (lightening).  You may have increased vaginal discharge.  You may notice your joints feel loose and you may have pain around your pelvic bone. What to expect at prenatal visits You will have prenatal exams every 2 weeks until week 36. Then you will have weekly prenatal exams. During a routine prenatal visit:  You will be weighed to make sure you and the baby are growing normally.  Your blood pressure will be taken.  Your abdomen will be measured to track your baby's growth.  The fetal heartbeat will be listened to.  Any test results from the previous visit will be discussed.  You may have a cervical check near your due date to see if your cervix has softened or thinned (effaced).  You will be tested for Group B streptococcus. This happens between 35 and 37 weeks. Your health care provider may ask you:  What your birth plan is.  How you are feeling.  If you are feeling the baby move.  If you have had any abnormal   symptoms, such as leaking fluid, bleeding, severe headaches, or abdominal cramping.  If you are using any tobacco products, including cigarettes, chewing tobacco, and electronic cigarettes.  If you have any questions. Other tests or screenings that may be performed during your third trimester include:  Blood tests that check for low iron levels (anemia).  Fetal testing to check the health, activity level, and growth of the fetus. Testing is done if you have certain medical conditions or if there are problems during the pregnancy.  Nonstress test  (NST). This test checks the health of your baby to make sure there are no signs of problems, such as the baby not getting enough oxygen. During this test, a belt is placed around your belly. The baby is made to move, and its heart rate is monitored during movement. What is false labor? False labor is a condition in which you feel small, irregular tightenings of the muscles in the womb (contractions) that usually go away with rest, changing position, or drinking water. These are called Braxton Hicks contractions. Contractions may last for hours, days, or even weeks before true labor sets in. If contractions come at regular intervals, become more frequent, increase in intensity, or become painful, you should see your health care provider. What are the signs of labor?  Abdominal cramps.  Regular contractions that start at 10 minutes apart and become stronger and more frequent with time.  Contractions that start on the top of the uterus and spread down to the lower abdomen and back.  Increased pelvic pressure and dull back pain.  A watery or bloody mucus discharge that comes from the vagina.  Leaking of amniotic fluid. This is also known as your "water breaking." It could be a slow trickle or a gush. Let your health care provider know if it has a color or strange odor. If you have any of these signs, call your health care provider right away, even if it is before your due date. Follow these instructions at home: Medicines  Follow your health care provider's instructions regarding medicine use. Specific medicines may be either safe or unsafe to take during pregnancy.  Take a prenatal vitamin that contains at least 600 micrograms (mcg) of folic acid.  If you develop constipation, try taking a stool softener if your health care provider approves. Eating and drinking   Eat a balanced diet that includes fresh fruits and vegetables, whole grains, good sources of protein such as meat, eggs, or tofu,  and low-fat dairy. Your health care provider will help you determine the amount of weight gain that is right for you.  Avoid raw meat and uncooked cheese. These carry germs that can cause birth defects in the baby.  If you have low calcium intake from food, talk to your health care provider about whether you should take a daily calcium supplement.  Eat four or five small meals rather than three large meals a day.  Limit foods that are high in fat and processed sugars, such as fried and sweet foods.  To prevent constipation: ? Drink enough fluid to keep your urine clear or pale yellow. ? Eat foods that are high in fiber, such as fresh fruits and vegetables, whole grains, and beans. Activity  Exercise only as directed by your health care provider. Most women can continue their usual exercise routine during pregnancy. Try to exercise for 30 minutes at least 5 days a week. Stop exercising if you experience uterine contractions.  Avoid heavy lifting.  Do   not exercise in extreme heat or humidity, or at high altitudes.  Wear low-heel, comfortable shoes.  Practice good posture.  You may continue to have sex unless your health care provider tells you otherwise. Relieving pain and discomfort  Take frequent breaks and rest with your legs elevated if you have leg cramps or low back pain.  Take warm sitz baths to soothe any pain or discomfort caused by hemorrhoids. Use hemorrhoid cream if your health care provider approves.  Wear a good support bra to prevent discomfort from breast tenderness.  If you develop varicose veins: ? Wear support pantyhose or compression stockings as told by your healthcare provider. ? Elevate your feet for 15 minutes, 3-4 times a day. Prenatal care  Write down your questions. Take them to your prenatal visits.  Keep all your prenatal visits as told by your health care provider. This is important. Safety  Wear your seat belt at all times when driving.  Make  a list of emergency phone numbers, including numbers for family, friends, the hospital, and police and fire departments. General instructions  Avoid cat litter boxes and soil used by cats. These carry germs that can cause birth defects in the baby. If you have a cat, ask someone to clean the litter box for you.  Do not travel far distances unless it is absolutely necessary and only with the approval of your health care provider.  Do not use hot tubs, steam rooms, or saunas.  Do not drink alcohol.  Do not use any products that contain nicotine or tobacco, such as cigarettes and e-cigarettes. If you need help quitting, ask your health care provider.  Do not use any medicinal herbs or unprescribed drugs. These chemicals affect the formation and growth of the baby.  Do not douche or use tampons or scented sanitary pads.  Do not cross your legs for long periods of time.  To prepare for the arrival of your baby: ? Take prenatal classes to understand, practice, and ask questions about labor and delivery. ? Make a trial run to the hospital. ? Visit the hospital and tour the maternity area. ? Arrange for maternity or paternity leave through employers. ? Arrange for family and friends to take care of pets while you are in the hospital. ? Purchase a rear-facing car seat and make sure you know how to install it in your car. ? Pack your hospital bag. ? Prepare the baby's nursery. Make sure to remove all pillows and stuffed animals from the baby's crib to prevent suffocation.  Visit your dentist if you have not gone during your pregnancy. Use a soft toothbrush to brush your teeth and be gentle when you floss. Contact a health care provider if:  You are unsure if you are in labor or if your water has broken.  You become dizzy.  You have mild pelvic cramps, pelvic pressure, or nagging pain in your abdominal area.  You have lower back pain.  You have persistent nausea, vomiting, or  diarrhea.  You have an unusual or bad smelling vaginal discharge.  You have pain when you urinate. Get help right away if:  Your water breaks before 37 weeks.  You have regular contractions less than 5 minutes apart before 37 weeks.  You have a fever.  You are leaking fluid from your vagina.  You have spotting or bleeding from your vagina.  You have severe abdominal pain or cramping.  You have rapid weight loss or weight gain.  You have   shortness of breath with chest pain.  You notice sudden or extreme swelling of your face, hands, ankles, feet, or legs.  Your baby makes fewer than 10 movements in 2 hours.  You have severe headaches that do not go away when you take medicine.  You have vision changes. Summary  The third trimester is from week 28 through week 40, months 7 through 9. The third trimester is a time when the unborn baby (fetus) is growing rapidly.  During the third trimester, your discomfort may increase as you and your baby continue to gain weight. You may have abdominal, leg, and back pain, sleeping problems, and an increased need to urinate.  During the third trimester your breasts will keep growing and they will continue to become tender. A yellow fluid (colostrum) may leak from your breasts. This is the first milk you are producing for your baby.  False labor is a condition in which you feel small, irregular tightenings of the muscles in the womb (contractions) that eventually go away. These are called Braxton Hicks contractions. Contractions may last for hours, days, or even weeks before true labor sets in.  Signs of labor can include: abdominal cramps; regular contractions that start at 10 minutes apart and become stronger and more frequent with time; watery or bloody mucus discharge that comes from the vagina; increased pelvic pressure and dull back pain; and leaking of amniotic fluid. This information is not intended to replace advice given to you by your  health care provider. Make sure you discuss any questions you have with your health care provider. Document Released: 06/13/2001 Document Revised: 07/25/2016 Document Reviewed: 07/25/2016 Elsevier Interactive Patient Education  2019 [employer]  
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
## 2018-07-04 ENCOUNTER — Other Ambulatory Visit: Payer: Self-pay | Admitting: Advanced Practice Midwife

## 2018-07-04 DIAGNOSIS — O99891 Other specified diseases and conditions complicating pregnancy: Secondary | ICD-10-CM

## 2018-07-04 DIAGNOSIS — O9989 Other specified diseases and conditions complicating pregnancy, childbirth and the puerperium: Secondary | ICD-10-CM

## 2018-07-04 DIAGNOSIS — F419 Anxiety disorder, unspecified: Secondary | ICD-10-CM

## 2018-07-04 DIAGNOSIS — Z8659 Personal history of other mental and behavioral disorders: Secondary | ICD-10-CM

## 2018-07-04 DIAGNOSIS — O99343 Other mental disorders complicating pregnancy, third trimester: Principal | ICD-10-CM

## 2018-07-04 MED ORDER — FLUOXETINE HCL 20 MG PO CAPS: 20.0000 mg | ORAL_CAPSULE | Freq: Every day | ORAL | 3 refills | Status: DC

## 2018-07-04 NOTE — Progress Notes (Signed)
Changed pt Rx for Prozac from tablets to capsules for insurance coverage.

## 2018-07-15 ENCOUNTER — Ambulatory Visit (INDEPENDENT_AMBULATORY_CARE_PROVIDER_SITE_OTHER): Payer: BLUE CROSS/BLUE SHIELD | Admitting: Advanced Practice Midwife

## 2018-07-15 VITALS — BP 129/79 | HR 104 | Wt 161.4 lb

## 2018-07-15 DIAGNOSIS — O26893 Other specified pregnancy related conditions, third trimester: Secondary | ICD-10-CM

## 2018-07-15 DIAGNOSIS — Z3A32 32 weeks gestation of pregnancy: Secondary | ICD-10-CM

## 2018-07-15 DIAGNOSIS — R51 Headache: Secondary | ICD-10-CM

## 2018-07-15 DIAGNOSIS — Z3483 Encounter for supervision of other normal pregnancy, third trimester: Secondary | ICD-10-CM

## 2018-07-15 DIAGNOSIS — O99343 Other mental disorders complicating pregnancy, third trimester: Secondary | ICD-10-CM

## 2018-07-15 DIAGNOSIS — R109 Unspecified abdominal pain: Secondary | ICD-10-CM

## 2018-07-15 DIAGNOSIS — O34219 Maternal care for unspecified type scar from previous cesarean delivery: Secondary | ICD-10-CM

## 2018-07-15 DIAGNOSIS — Z3480 Encounter for supervision of other normal pregnancy, unspecified trimester: Secondary | ICD-10-CM

## 2018-07-15 DIAGNOSIS — Z8659 Personal history of other mental and behavioral disorders: Secondary | ICD-10-CM

## 2018-07-15 DIAGNOSIS — O9989 Other specified diseases and conditions complicating pregnancy, childbirth and the puerperium: Secondary | ICD-10-CM

## 2018-07-15 DIAGNOSIS — F419 Anxiety disorder, unspecified: Secondary | ICD-10-CM

## 2018-07-15 MED ORDER — BUTALBITAL-APAP-CAFFEINE 50-325-40 MG PO TABS: 1.0000 | ORAL_TABLET | Freq: Four times a day (QID) | ORAL | 0 refills | Status: DC | PRN

## 2018-07-15 MED ORDER — COMFORT FIT MATERNITY SUPP MED MISC: 1.0000 | Freq: Every day | 0 refills | Status: DC

## 2018-07-15 NOTE — Progress Notes (Signed)
Patient reports fetal movement with irregular contractions and pressure. 

## 2018-07-15 NOTE — Progress Notes (Signed)
PRENATAL VISIT NOTE  Subjective:  Sabrina Rasmussen is a 28 y.o. G4P1021 at [redacted]w[redacted]d being seen today for ongoing prenatal care.  She is currently monitored for the following issues for this low-risk pregnancy and has Major depressive disorder with single episode; GAD (generalized anxiety disorder); Supervision of other normal pregnancy, antepartum; Previous cesarean delivery affecting pregnancy; Tobacco use; Constipation; History of postpartum depression, currently pregnant in third trimester; and Anxiety during pregnancy in third trimester, antepartum on their problem list.  Patient reports constant pelvic pain, pain at sympysis pubis.   .  .   . Denies leaking of fluid.   The following portions of the patient's history were reviewed and updated as appropriate: allergies, current medications, past family history, past medical history, past social history, past surgical history and problem list. Problem list updated.  Objective:  There were no vitals filed for this visit.  Fetal Status:           General:  Alert, oriented and cooperative. Patient is in no acute distress.  Skin: Skin is warm and dry. No rash noted.   Cardiovascular: Normal heart rate noted  Respiratory: Normal respiratory effort, no problems with respiration noted  Abdomen: Soft, gravid, appropriate for gestational age.        Pelvic: Cervical exam deferred        Extremities: Normal range of motion.     Mental Status: Normal mood and affect. Normal behavior. Normal judgment and thought content.   Assessment and Plan:  Pregnancy: G4P1021 at [redacted]w[redacted]d  1. Anxiety during pregnancy in third trimester, antepartum --Pt continues to struggle with anxiety.  She stopped taking Prozac after 4 days due to h/a.  She does not want to talk to counselor.  She reports she is managing, maybe not well but she is coping. She denies any thoughts of harm to herself or others.  She is safe at home.  2. History of postpartum depression, currently  pregnant in third trimester --Discussed Prozac again.  Pt at high risk for PP depression so recommend she start medication now.  Recommend counseling again but pt declines.  --Pt to take 1/2 tablet, 10 mg daily x 6 days then 1 tablet daily as prescribed. Fioriect for h/a, see below.  3. Supervision of other normal pregnancy, antepartum --Anticipatory guidance about next visits/weeks of pregnancy given.   4. Previous cesarean delivery affecting pregnancy --Plans repeat and consent signed . Discussed today, pt has questions about VBAC and is considering. --First C/S for NR FHR.  5. Abdominal pain during pregnancy in third trimester --Rest/ice/heat/warm bath/Tylenol/Flexeril as prescribed. Increase PO fluids. - Elastic Bandages & Supports (COMFORT FIT MATERNITY SUPP MED) MISC; 1 Device by Does not apply route daily.  Dispense: 1 each; Refill: 0 --Ordered UA and culture but pt left office without leaving sample.  6. Headache in pregnancy, antepartum, third trimester --H/A when started Prozac so stopped. Pt at high risk for PP depression.  Discussed and pt to try Fioricet, increased PO fluids, and start Prozac at half tablet daily x 6 days first to reduce side effects. - butalbital-acetaminophen-caffeine (FIORICET, ESGIC) 50-325-40 MG tablet; Take 1-2 tablets by mouth every 6 (six) hours as needed for headache or migraine.  Dispense: 14 tablet; Refill: 0  Preterm labor symptoms and general obstetric precautions including but not limited to vaginal bleeding, contractions, leaking of fluid and fetal movement were reviewed in detail with the patient. Please refer to After Visit Summary for other counseling recommendations.  No follow-ups on  file.  Future Appointments  Date Time Provider Department Center  07/15/2018 10:30 AM Leftwich-Kirby, Wilmer Floor, CNM [facility] None    Sharen Counter, CNM

## 2018-07-15 NOTE — Patient Instructions (Addendum)
PREGNANCY SUPPORT BELT: You are not alone, Seventy-five percent of women have some sort of abdominal or back pain at some point in their pregnancy. Your baby is growing at a fast pace, which means that your whole body is rapidly trying to adjust to the changes. As your uterus grows, your back may start feeling a bit under stress and this can result in back or abdominal pain that can go from mild, and therefore bearable, to severe pains that will not allow you to sit or lay down comfortably, When it comes to dealing with pregnancy-related pains and cramps, some pregnant women usually prefer natural remedies, which the market is filled with nowadays. For example, wearing a pregnancy support belt can help ease and lessen your discomfort and pain. WHAT ARE THE BENEFITS OF WEARING A PREGNANCY SUPPORT BELT? A pregnancy support belt provides support to the lower portion of the belly taking some of the weight of the growing uterus and distributing to the other parts of your body. It is designed make you comfortable and gives you extra support. Over the years, the pregnancy apparel market has been studying the needs and wants of pregnant women and they have come up with the most comfortable pregnancy support belts that woman could ever ask for. In fact, you will no longer have to wear a stretched-out or bulky pregnancy belt that is visible underneath your clothes and makes you feel even more uncomfortable. Nowadays, a pregnancy support belt is made of comfortable and stretchy materials that will not irritate your skin but will actually make you feel at ease and you will not even notice you are wearing it. They are easy to put on and adjust during the day and can be worn at night for additional support.  BENEFITS: . Relives Back pain . Relieves Abdominal Muscle and Leg Pain . Stabilizes the Pelvic Ring . Offers a Cushioned Abdominal Lift Pad . Relieves pressure on the Sciatic Nerve Within Minutes  WHERE TO GET  YOUR PREGNANCY BELT: [employer] [phone] @[phone]  [address] [place], [place] [address]    Perinatal Anxiety When a woman feels excessive tension or worry (anxiety) during pregnancy or during the first 12 months after she gives birth, she has a condition called perinatal anxiety. Anxiety can interfere with work, school, relationships, and other everyday activities. If it is not managed properly, it can also cause problems in the mother and her baby.  If you are pregnant and you have symptoms of an anxiety disorder, it is important to talk with your health care provider. What are the causes? The exact cause of this condition is not known. Hormonal changes during and after pregnancy may play a role in causing perinatal anxiety. What increases the risk? You are more likely to develop this condition if:  You have a personal or family history of depression, anxiety, or mood disorders.  You experience a stressful life event during pregnancy, such as the death of a loved one.  You have a lot of regular life stress, such as being a single parent.  You have thyroid problems. What are the signs or symptoms? Perinatal anxiety can be different for everyone. It may include:  Panic attacks (panic disorder). These are intense episodes of fear or discomfort that may also cause sweating, nausea, shortness of breath, or fear of dying. They usually last 5-15 minutes.  Reliving an upsetting (traumatic) event through distressing thoughts, dreams, or flashbacks (post-traumatic stress disorder, or PTSD).  Excessive worry  about multiple problems (generalized anxiety disorder).  Fear and stress about leaving certain people or loved ones (separation anxiety).  Performing repetitive tasks (compulsions) to relieve stress or worry (obsessive compulsive disorder, or OCD).  Fear of certain objects or situations (phobias).  Excessive worrying, such as a constant feeling that something bad is  going to happen.  Inability to relax.  Difficulty concentrating.  Sleep problems.  Frequent nightmares or disturbing thoughts. How is this diagnosed? This condition is diagnosed based on a physical exam and mental evaluation. In some cases, your health care provider may use an anxiety screening tool. These tools include a list of questions that can help a health care provider diagnose anxiety. Your health care provider may refer you to a mental health expert who specializes in anxiety. How is this treated? This condition may be treated with:  Medicines. Your health care provider will only give you medicines that have been proven safe for pregnancy and breastfeeding.  Talk therapy with a mental health professional to help change your patterns of thinking (cognitive behavioral therapy).  Mindfulness-based stress reduction.  Other relaxation therapies, such as deep breathing or guided muscle relaxation.  Support groups. Follow these instructions at home: Lifestyle  Do not use any products that contain nicotine or tobacco, such as cigarettes and e-cigarettes. If you need help quitting, ask your health care provider.  Do not use alcohol when you are pregnant. After your baby is born, limit alcohol intake to no more than 1 drink a day. One drink equals 12 oz of beer, 5 oz of wine, or 1 oz of hard liquor.  Consider joining a support group for new mothers. Ask your health care provider for recommendations.  Take good care of yourself. Make sure you: ? Get plenty of sleep. If you are having trouble sleeping, talk with your health care provider. ? Eat a healthy diet. This includes plenty of fruits and vegetables, whole grains, and lean proteins. ? Exercise regularly, as told by your health care provider. Ask your health care provider what exercises are safe for you. General instructions  Take over-the-counter and prescription medicines only as told by your health care provider.  Talk  with your partner or family members about your feelings during pregnancy. Share any concerns or fears that you may have.  Ask for help with tasks or chores when you need it. Ask friends and family members to provide meals, watch your children, or help with cleaning.  Keep all follow-up visits as told by your health care provider. This is important. Contact a health care provider if:  You (or people close to you) notice that you have any symptoms of anxiety or depression.  You have anxiety and your symptoms get worse.  You experience side effects from medicines, such as nausea or sleep problems. Get help right away if:  You feel like hurting yourself, your baby, or someone else. If you ever feel like you may hurt yourself or others, or have thoughts about taking your own life, get help right away. You can go to your nearest emergency department or call:  Your local emergency services (911 in the [place]  A suicide crisis helpline, such as the National Suicide Prevention Lifeline at (418)410-16561-(586)422-9396. This is open 24 hours a day. Summary  Perinatal anxiety is when a woman feels excessive tension or worry during pregnancy or during the first 12 months after she gives birth.  Perinatal anxiety may include panic attacks, post-traumatic stress disorder, separation anxiety, phobias, or  generalized anxiety.  Perinatal anxiety can cause physical health problems in the mother and baby if not properly managed.  This condition is treated with medicines, talk therapy, stress reduction therapies, or a combination of two or more treatments.  Talk with your partner or family members about your concerns or fears. Do not be afraid to ask for help. This information is not intended to replace advice given to you by your health care provider. Make sure you discuss any questions you have with your health care provider. Document Released: 08/16/2016 Document Revised: 08/16/2016 Document Reviewed:  08/16/2016 Elsevier Interactive Patient Education  [employer].

## 2018-07-25 ENCOUNTER — Encounter (HOSPITAL_COMMUNITY): Payer: Self-pay

## 2018-07-25 ENCOUNTER — Inpatient Hospital Stay (HOSPITAL_COMMUNITY)
Admission: AD | Admit: 2018-07-25 | Discharge: 2018-07-25 | Disposition: A | Discharge status: Home / Self Care | Payer: BLUE CROSS/BLUE SHIELD | Source: Ambulatory Visit | Attending: Obstetrics & Gynecology | Admitting: Obstetrics & Gynecology

## 2018-07-25 DIAGNOSIS — O4703 False labor before 37 completed weeks of gestation, third trimester: Secondary | ICD-10-CM | POA: Diagnosis not present

## 2018-07-25 DIAGNOSIS — Z3A34 34 weeks gestation of pregnancy: Secondary | ICD-10-CM | POA: Diagnosis not present

## 2018-07-25 DIAGNOSIS — O26893 Other specified pregnancy related conditions, third trimester: Secondary | ICD-10-CM | POA: Diagnosis not present

## 2018-07-25 DIAGNOSIS — R109 Unspecified abdominal pain: Secondary | ICD-10-CM | POA: Diagnosis not present

## 2018-07-25 DIAGNOSIS — Z87891 Personal history of nicotine dependence: Secondary | ICD-10-CM | POA: Insufficient documentation

## 2018-07-25 DIAGNOSIS — R102 Pelvic and perineal pain: Secondary | ICD-10-CM | POA: Diagnosis not present

## 2018-07-25 LAB — URINALYSIS, ROUTINE W REFLEX MICROSCOPIC
BILIRUBIN URINE: NEGATIVE
GLUCOSE, UA: NEGATIVE mg/dL
HGB URINE DIPSTICK: NEGATIVE
Ketones, ur: 5 mg/dL — AB
Leukocytes, UA: NEGATIVE
Nitrite: NEGATIVE
PROTEIN: NEGATIVE mg/dL
Specific Gravity, Urine: 1.009 (ref 1.005–1.030)
pH: 7 (ref 5.0–8.0)

## 2018-07-25 LAB — WET PREP, GENITAL
Clue Cells Wet Prep HPF POC: NONE SEEN
SPERM: NONE SEEN
Trich, Wet Prep: NONE SEEN
Yeast Wet Prep HPF POC: NONE SEEN

## 2018-07-25 MED ORDER — ACETAMINOPHEN 325 MG PO TABS
650.0000 mg | ORAL_TABLET | Freq: Once | ORAL | Status: AC
  Administered 2018-07-25: 650 mg via ORAL
  Filled 2018-07-25: qty 2

## 2018-07-25 MED ORDER — NIFEDIPINE 10 MG PO CAPS
10.0000 mg | ORAL_CAPSULE | Freq: Once | ORAL | Status: AC
  Administered 2018-07-25: 10 mg via ORAL
  Filled 2018-07-25: qty 1

## 2018-07-25 NOTE — Discharge Instructions (Signed)
Preventing Preterm Birth  Preterm birth is when your baby is delivered between 20 weeks and 37 weeks of pregnancy. A full-term pregnancy lasts for at least 37 weeks. Preterm birth can be dangerous for your baby because the last few weeks of pregnancy are an important time for your baby's brain and lungs to grow. Many things can cause a baby to be born early. Sometimes the cause is not known. There are certain factors that make you more likely to experience preterm birth, such as:  · Having a previous baby born preterm.  · Being pregnant with twins or other multiples.  · Having had fertility treatment.  · Being overweight or underweight at the start of your pregnancy.  · Having any of the following during pregnancy:  ? An infection, including a urinary tract infection (UTI) or an STI (sexually transmitted infection).  ? High blood pressure.  ? Diabetes.  ? Vaginal bleeding.  · Being age 35 or older.  · Being age 18 or younger.  · Getting pregnant within 6 months of a previous pregnancy.  · Suffering extreme stress or physical or emotional abuse during pregnancy.  · Standing for long periods of time during pregnancy, such as working at a job that requires standing.  What are the risks?  The most serious risk of preterm birth is that the baby may not survive. This is more likely to happen if a baby is born before 34 weeks. Other risks and complications of preterm birth may include your baby having:  · Breathing problems.  · Brain damage that affects movement and coordination (cerebral palsy).  · Feeding difficulties.  · Vision or hearing problems.  · Infections or inflammation of the digestive tract (colitis).  · Developmental delays.  · Learning disabilities.  · Higher risk for diabetes, heart disease, and high blood pressure later in life.  What can I do to lower my risk?    Medical care  The most important thing you can do to lower your risk for preterm birth is to get routine medical care during pregnancy (prenatal  care). If you have a high risk of preterm birth, you may be referred to a health care provider who specializes in managing high-risk pregnancies (perinatologist). You may be given medicine to help prevent preterm birth.  Lifestyle changes  Certain lifestyle changes can also lower your risk of preterm birth:  · Wait at least 6 months after a pregnancy to become pregnant again.  · Try to plan pregnancy for when you are between 19 and 35 years old.  · Get to a healthy weight before getting pregnant. If you are overweight, work with your health care provider to safely lose weight.  · Do not use any products that contain nicotine or tobacco, such as cigarettes and e-cigarettes. If you need help quitting, ask your health care provider.  · Do not drink alcohol.  · Do not use drugs.  Where to find support  For more support, consider:  · Talking with your health care provider.  · Talking with a therapist or substance abuse counselor, if you need help quitting.  · Working with a diet and nutrition specialist (dietitian) or a personal trainer to maintain a healthy weight.  · Joining a support group.  Where to find more information  Learn more about preventing preterm birth from:  · Centers for Disease Control and Prevention: cdc.gov/reproductivehealth/maternalinfanthealth/pretermbirth.htm  · March of Dimes: marchofdimes.org/complications/premature-babies.aspx  · American Pregnancy Association: americanpregnancy.org/labor-and-birth/premature-labor  Contact a health care   provider if:  · You have any of the following signs of preterm labor before 37 weeks:  ? A change or increase in vaginal discharge.  ? Fluid leaking from your vagina.  ? Pressure or cramps in your lower abdomen.  ? A backache that does not go away or gets worse.  ? Regular tightening (contractions) in your lower abdomen.  Summary  · Preterm birth means having your baby during weeks 20-37 of pregnancy.  · Preterm birth may put your baby at risk for physical and  mental problems.  · Getting good prenatal care can help prevent preterm birth.  · You can lower your risk of preterm birth by making certain lifestyle changes, such as not smoking and not using alcohol.  This information is not intended to replace advice given to you by your health care provider. Make sure you discuss any questions you have with your health care provider.  Document Released: 08/03/2015 Document Revised: 02/26/2016 Document Reviewed: 02/26/2016  Elsevier Interactive Patient Education © 2019 Elsevier Inc.

## 2018-07-25 NOTE — MAU Provider Note (Signed)
Obstetric Attending MAU Note  Chief Complaint:  Abdominal Pain   First Provider Initiated Contact with Patient 07/25/18 1914     HPI: Sabrina Rasmussen is a 28 y.o. [id number] at 8266w2d who presents to maternity admissions reporting Here with pelvic pain and pressure starting today. Worsened in last few hours. Reports pain comes and goes and is every 5-10 mins. Pelvic pressure is more constant. Notes vaginal discharge which looks like snot. No vaginal bleeding. No leakage of fluid. No h/o PTB. Has tried resting today without much in the way of relief. Denies contractions, leakage of fluid or vaginal bleeding. Good fetal movement.   Pregnancy Course: [facility] Patient Active Problem List   Diagnosis Date Noted  . History of postpartum depression, currently pregnant in third trimester 07/01/2018  . Anxiety during pregnancy in third trimester, antepartum 07/01/2018  . Constipation 04/17/2018  . Tobacco use 03/22/2018  . Supervision of other normal pregnancy, antepartum 02/20/2018  . Previous cesarean delivery affecting pregnancy 02/20/2018  . Major depressive disorder with single episode 03/17/2017  . GAD (generalized anxiety disorder) 03/17/2017    Past Medical History:  Diagnosis Date  . Anxiety   . Depression   . Panic attack     OB History  Gravida Para Term Preterm AB Living  4 1 1  0 2 1  SAB TAB Ectopic Multiple Live Births  2 0 0 0 1    # Outcome Date GA Lbr Len/2nd Weight Sex Delivery Anes PTL Lv  4 Current           3 SAB 08/13/17          2 SAB 12/2016          1 Term 04/16/11 2972w0d   F CS-LTranv   LIV     Birth Comments: System Generated. Please review and update pregnancy details.    Past Surgical History:  Procedure Laterality Date  . ABSCESS DRAINAGE    . CESAREAN SECTION      Family History: No family history on file.  Social History: Social History   Tobacco Use  . Smoking status: Former Smoker    Packs/day: 0.25    Types: Cigarettes  .  Smokeless tobacco: Never Used  . Tobacco comment: 2/day  Substance Use Topics  . Alcohol use: No  . Drug use: Yes    Types: Marijuana    Comment: last marijuana use beginning of July/2019    Allergies:  Allergies  Allergen Reactions  . Buspirone Other (See Comments)    Causes nightmares    Medications Prior to Admission  Medication Sig Dispense Refill Last Dose  . acetaminophen (TYLENOL) 325 MG tablet Take 650 mg by mouth every 6 (six) hours as needed.   Taking  . bisacodyl (DULCOLAX) 10 MG suppository Place 1 suppository (10 mg total) rectally daily. (Patient not taking: Reported on 07/15/2018) 30 suppository 1 Not Taking  . butalbital-acetaminophen-caffeine (FIORICET, ESGIC) 50-325-40 MG tablet Take 1-2 tablets by mouth every 6 (six) hours as needed for headache or migraine. 14 tablet 0   . cyclobenzaprine (FLEXERIL) 10 MG tablet Take 1 tablet (10 mg total) by mouth 3 (three) times daily as needed for muscle spasms. 30 tablet 2 Taking  . Elastic Bandages & Supports (COMFORT FIT MATERNITY SUPP MED) MISC 1 Device by Does not apply route daily. 1 each 0   . FLUoxetine (PROZAC) 20 MG capsule Take 1 capsule (20 mg total) by mouth daily. 30 capsule 3 Taking  .  hydrOXYzine (ATARAX/VISTARIL) 25 MG tablet Take 1 tablet (25 mg total) by mouth 3 (three) times daily as needed. 90 tablet 3 Taking  . nicotine (NICODERM CQ - DOSED IN MG/24 HOURS) 21 mg/24hr patch Place 1 patch (21 mg total) onto the skin daily. (Patient not taking: Reported on 07/01/2018) 28 patch 5 Not Taking  . nicotine polacrilex (NICORETTE) 2 MG gum Take 1 each (2 mg total) by mouth as needed for smoking cessation. (Patient not taking: Reported on 07/01/2018) 100 tablet 5 Not Taking  . polyethylene glycol (MIRALAX) packet Take 17 g by mouth 2 (two) times daily. (Patient not taking: Reported on 07/15/2018) 14 each 0 Not Taking  . Prenatal Vit-Fe Fumarate-FA (PRENATAL MULTIVITAMIN) TABS tablet Take 1 tablet by mouth daily at 12 noon.    Taking  . promethazine (PHENERGAN) 12.5 MG tablet Take 1 tablet (12.5 mg total) by mouth every 8 (eight) hours as needed for nausea or vomiting. 30 tablet 0 Taking    ROS: Pertinent findings in history of present illness.  Physical Exam  Blood pressure 133/78, pulse 96, temperature 97.8 F (36.6 C), resp. rate 19, height 5\' 3"  (1.6 m), weight 70.8 kg, last menstrual period 11/22/2017, SpO2 99 %, unknown if currently breastfeeding. CONSTITUTIONAL: Well-developed, well-nourished female in no acute distress.  HENT:  Normocephalic, atraumatic, External right and left ear normal. Oropharynx is clear and moist EYES: Conjunctivae and EOM are normal. No scleral icterus.  NECK: Normal range of motion, supple, no masses SKIN: Skin is warm and dry. No rash noted. Not diaphoretic. No erythema. No pallor. NEUROLGIC: Alert and oriented to person, place, and time. Marland Kitchen. PSYCHIATRIC: Normal mood and affect. Normal behavior. Normal judgment and thought content. CARDIOVASCULAR: Normal heart rate noted, regular rhythm RESPIRATORY: Effort and breath sounds normal, no problems with respiration noted ABDOMEN: Soft, nontender, nondistended, gravid appropriate for gestational age MUSCULOSKELETAL: Normal range of motion. No edema and no tenderness. 2+ distal pulses.  SPECULUM EXAM: NEFG, physiologic discharge, no blood, cervix clean Dilation: Fingertip(Closed at internal os) Effacement (%): 50 Station: -2 Exam by:: Tinnie Gensanya Elise Knobloch, MD  FHT:  Baseline 145 , moderate variability, accelerations present, no decelerations Contractions: q 5 mins   Labs: Results for orders placed or performed during the hospital encounter of 07/25/18 (from the past 24 hour(s))  Urinalysis, Routine w reflex microscopic     Status: Abnormal   Collection Time: 07/25/18  6:40 PM  Result Value Ref Range   Color, Urine YELLOW YELLOW   APPearance CLEAR CLEAR   Specific Gravity, Urine 1.009 1.005 - 1.030   pH 7.0 5.0 - 8.0   Glucose, UA  NEGATIVE NEGATIVE mg/dL   Hgb urine dipstick NEGATIVE NEGATIVE   Bilirubin Urine NEGATIVE NEGATIVE   Ketones, ur 5 (A) NEGATIVE mg/dL   Protein, ur NEGATIVE NEGATIVE mg/dL   Nitrite NEGATIVE NEGATIVE   Leukocytes, UA NEGATIVE NEGATIVE  Wet prep, genital     Status: Abnormal   Collection Time: 07/25/18  7:27 PM  Result Value Ref Range   Yeast Wet Prep HPF POC NONE SEEN NONE SEEN   Trich, Wet Prep NONE SEEN NONE SEEN   Clue Cells Wet Prep HPF POC NONE SEEN NONE SEEN   WBC, Wet Prep HPF POC FEW (A) NONE SEEN   Sperm NONE SEEN     Imaging:  No results found.  MAU Course: Given Procardia Tylenol Contractions improved as did pain No evidence of labor  Assessment: 1. Pelvic pain in pregnancy, antepartum, third trimester   2.  Preterm uterine contractions in third trimester, antepartum     Plan: Discharge home Labor precautions and fetal kick counts reviewed Follow up with OB provider  Follow-up Information    [facility].   Specialty:  Obstetrics and Gynecology Contact information: [address] [place] [address] [phone]          Allergies as of 07/25/2018      Reactions   Buspirone Other (See Comments)   Causes nightmares      Medication List    TAKE these medications   acetaminophen 325 MG tablet Commonly known as:  TYLENOL Take 650 mg by mouth every 6 (six) hours as needed.   bisacodyl 10 MG suppository Commonly known as:  DULCOLAX Place 1 suppository (10 mg total) rectally daily.   butalbital-acetaminophen-caffeine 50-325-40 MG tablet Commonly known as:  FIORICET, ESGIC Take 1-2 tablets by mouth every 6 (six) hours as needed for headache or migraine.   COMFORT FIT MATERNITY SUPP MED Misc 1 Device by Does not apply route daily.   cyclobenzaprine 10 MG tablet Commonly known as:  FLEXERIL Take 1 tablet (10 mg total) by mouth 3 (three) times daily as needed for muscle spasms.   FLUoxetine 20  MG capsule Commonly known as:  PROZAC Take 1 capsule (20 mg total) by mouth daily.   hydrOXYzine 25 MG tablet Commonly known as:  ATARAX/VISTARIL Take 1 tablet (25 mg total) by mouth 3 (three) times daily as needed.   nicotine 21 mg/24hr patch Commonly known as:  NICODERM CQ - dosed in mg/24 hours Place 1 patch (21 mg total) onto the skin daily.   nicotine polacrilex 2 MG gum Commonly known as:  NICORETTE Take 1 each (2 mg total) by mouth as needed for smoking cessation.   polyethylene glycol packet Commonly known as:  MIRALAX Take 17 g by mouth 2 (two) times daily.   prenatal multivitamin Tabs tablet Take 1 tablet by mouth daily at 12 noon.   promethazine 12.5 MG tablet Commonly known as:  PHENERGAN Take 1 tablet (12.5 mg total) by mouth every 8 (eight) hours as needed for nausea or vomiting.       Reva Bores, MD 07/25/2018 9:02 PM

## 2018-07-25 NOTE — MAU Note (Signed)
Severe abdominal pain that started 4 hours ago-has laid in the bed since then but has only gotten worse.  No LOF/VB.  States she does feel like her abdomen is tight with the pain but isn't sure whether it's contractions or not.  + FM.  Reports no complications with her pregnancy.

## 2018-07-26 LAB — GC/CHLAMYDIA PROBE AMP (~~LOC~~) NOT AT ARMC
Chlamydia: NEGATIVE
Neisseria Gonorrhea: NEGATIVE

## 2018-07-29 ENCOUNTER — Ambulatory Visit (INDEPENDENT_AMBULATORY_CARE_PROVIDER_SITE_OTHER): Payer: BLUE CROSS/BLUE SHIELD | Admitting: Advanced Practice Midwife

## 2018-07-29 VITALS — BP 123/83 | HR 97 | Wt 162.6 lb

## 2018-07-29 DIAGNOSIS — O99343 Other mental disorders complicating pregnancy, third trimester: Secondary | ICD-10-CM

## 2018-07-29 DIAGNOSIS — O0990 Supervision of high risk pregnancy, unspecified, unspecified trimester: Secondary | ICD-10-CM

## 2018-07-29 DIAGNOSIS — O0993 Supervision of high risk pregnancy, unspecified, third trimester: Secondary | ICD-10-CM

## 2018-07-29 DIAGNOSIS — F419 Anxiety disorder, unspecified: Secondary | ICD-10-CM

## 2018-07-29 DIAGNOSIS — O34219 Maternal care for unspecified type scar from previous cesarean delivery: Secondary | ICD-10-CM

## 2018-07-29 MED ORDER — CONCEPT OB 130-92.4-1 MG PO CAPS: 1.0000 | ORAL_CAPSULE | Freq: Every day | ORAL | 11 refills | Status: AC

## 2018-07-29 MED ORDER — FLUOXETINE HCL 40 MG PO CAPS: 40.0000 mg | ORAL_CAPSULE | Freq: Every day | ORAL | 5 refills | Status: DC

## 2018-07-29 NOTE — Progress Notes (Signed)
Patient requesting to change from Prenatal Gummies to Prenatal Pills.

## 2018-07-29 NOTE — Addendum Note (Signed)
Addended by: Sharen Counter A on: 07/29/2018 05:20 PM   Modules accepted: Orders

## 2018-07-29 NOTE — Progress Notes (Signed)
   PRENATAL VISIT NOTE  Subjective:  Sabrina Rasmussen is a 28 y.o. G4P1021 at [redacted]w[redacted]d being seen today for ongoing prenatal care.  She is currently monitored for the following issues for this high-risk pregnancy and has Major depressive disorder with single episode; GAD (generalized anxiety disorder); Supervision of other normal pregnancy, antepartum; Previous cesarean delivery affecting pregnancy; Tobacco use; Constipation; History of postpartum depression, currently pregnant in third trimester; and Anxiety during pregnancy in third trimester, antepartum on their problem list.  Patient reports pelvic pain and pressure, pain in legs, ankles.   .  .   . Denies leaking of fluid.   The following portions of the patient's history were reviewed and updated as appropriate: allergies, current medications, past family history, past medical history, past social history, past surgical history and problem list. Problem list updated.  Objective:  There were no vitals filed for this visit.  Fetal Status:           General:  Alert, oriented and cooperative. Patient is in no acute distress.  Skin: Skin is warm and dry. No rash noted.   Cardiovascular: Normal heart rate noted  Respiratory: Normal respiratory effort, no problems with respiration noted  Abdomen: Soft, gravid, appropriate for gestational age.        Pelvic: Cervical exam deferred        Extremities: Normal range of motion.     Mental Status: Normal mood and affect. Normal behavior. Normal judgment and thought content.   Assessment and Plan:  Pregnancy: G4P1021 at [redacted]w[redacted]d  1. Anxiety during pregnancy in third trimester, antepartum --Pt continues to struggle with anxiety.  She became tearful in the office reporting fear of the severe PP depression she had with her daughter.   --On Prozac 20 mg daily, sometimes taking 40 mg as discussed at previous visit but not consistently.  Increase to 40 mg daily. Pt to take daily without missing a dose.     --Recommend counseling, pt given contact information for [facility] the [place].  --Pt denies any thoughts of harm to herself or her children, just extreme anxiety about her postpartum time since she had severe PP depression and did not bond with her daughter in the first few weeks. --Pt to seek help, come to MAU/ED with any thoughts of harm or if she feels like she needs immediate help for her symptoms.  3. Supervision of high risk pregnancy, antepartum --Due to depression/anxiety, severe PP depression hx.  4.  Previous C/S --Considering VBAC vs RLTCS  Preterm labor symptoms and general obstetric precautions including but not limited to vaginal bleeding, contractions, leaking of fluid and fetal movement were reviewed in detail with the patient. Please refer to After Visit Summary for other counseling recommendations.  No follow-ups on file.  Future Appointments  Date Time Provider Department Center  07/29/2018  1:30 PM Leftwich-Kirby, Wilmer Floor, CNM [facility] None    Sharen Counter, CNM

## 2018-07-29 NOTE — Patient Instructions (Signed)
Perinatal Anxiety °When a woman feels excessive tension or worry (anxiety) during pregnancy or during the first 12 months after she gives birth, she has a condition called perinatal anxiety. Anxiety can interfere with work, school, relationships, and other everyday activities. If it is not managed properly, it can also cause problems in the mother and her baby.  °If you are pregnant and you have symptoms of an anxiety disorder, it is important to talk with your health care provider. °What are the causes? °The exact cause of this condition is not known. Hormonal changes during and after pregnancy may play a role in causing perinatal anxiety. °What increases the risk? °You are more likely to develop this condition if: °· You have a personal or family history of depression, anxiety, or mood disorders. °· You experience a stressful life event during pregnancy, such as the death of a loved one. °· You have a lot of regular life stress, such as being a single parent. °· You have thyroid problems. °What are the signs or symptoms? °Perinatal anxiety can be different for everyone. It may include: °· Panic attacks (panic disorder). These are intense episodes of fear or discomfort that may also cause sweating, nausea, shortness of breath, or fear of dying. They usually last 5-15 minutes. °· Reliving an upsetting (traumatic) event through distressing thoughts, dreams, or flashbacks (post-traumatic stress disorder, or PTSD). °· Excessive worry about multiple problems (generalized anxiety disorder). °· Fear and stress about leaving certain people or loved ones (separation anxiety). °· Performing repetitive tasks (compulsions) to relieve stress or worry (obsessive compulsive disorder, or OCD). °· Fear of certain objects or situations (phobias). °· Excessive worrying, such as a constant feeling that something bad is going to happen. °· Inability to relax. °· Difficulty concentrating. °· Sleep problems. °· Frequent nightmares or  disturbing thoughts. °How is this diagnosed? °This condition is diagnosed based on a physical exam and mental evaluation. In some cases, your health care provider may use an anxiety screening tool. These tools include a list of questions that can help a health care provider diagnose anxiety. Your health care provider may refer you to a mental health expert who specializes in anxiety. °How is this treated? °This condition may be treated with: °· Medicines. Your health care provider will only give you medicines that have been proven safe for pregnancy and breastfeeding. °· Talk therapy with a mental health professional to help change your patterns of thinking (cognitive behavioral therapy). °· Mindfulness-based stress reduction. °· Other relaxation therapies, such as deep breathing or guided muscle relaxation. °· Support groups. °Follow these instructions at home: °Lifestyle °· Do not use any products that contain nicotine or tobacco, such as cigarettes and e-cigarettes. If you need help quitting, ask your health care provider. °· Do not use alcohol when you are pregnant. After your baby is born, limit alcohol intake to no more than 1 drink a day. One drink equals 12 oz of beer, 5 oz of wine, or 1½ oz of hard liquor. °· Consider joining a support group for new mothers. Ask your health care provider for recommendations. °· Take good care of yourself. Make sure you: °? Get plenty of sleep. If you are having trouble sleeping, talk with your health care provider. °? Eat a healthy diet. This includes plenty of fruits and vegetables, whole grains, and lean proteins. °? Exercise regularly, as told by your health care provider. Ask your health care provider what exercises are safe for you. °General instructions °· Take over-the-counter   and prescription medicines only as told by your health care provider. °· Talk with your partner or family members about your feelings during pregnancy. Share any concerns or fears that you may  have. °· Ask for help with tasks or chores when you need it. Ask friends and family members to provide meals, watch your children, or help with cleaning. °· Keep all follow-up visits as told by your health care provider. This is important. °Contact a health care provider if: °· You (or people close to you) notice that you have any symptoms of anxiety or depression. °· You have anxiety and your symptoms get worse. °· You experience side effects from medicines, such as nausea or sleep problems. °Get help right away if: °· You feel like hurting yourself, your baby, or someone else. °If you ever feel like you may hurt yourself or others, or have thoughts about taking your own life, get help right away. You can go to your nearest emergency department or call: °· Your local emergency services (911 in the [place] °· A suicide crisis helpline, such as the National Suicide Prevention Lifeline at 1-800-273-8255. This is open 24 hours a day. °Summary °· Perinatal anxiety is when a woman feels excessive tension or worry during pregnancy or during the first 12 months after she gives birth. °· Perinatal anxiety may include panic attacks, post-traumatic stress disorder, separation anxiety, phobias, or generalized anxiety. °· Perinatal anxiety can cause physical health problems in the mother and baby if not properly managed. °· This condition is treated with medicines, talk therapy, stress reduction therapies, or a combination of two or more treatments. °· Talk with your partner or family members about your concerns or fears. Do not be afraid to ask for help. °This information is not intended to replace advice given to you by your health care provider. Make sure you discuss any questions you have with your health care provider. °Document Released: 08/16/2016 Document Revised: 08/16/2016 Document Reviewed: 08/16/2016 °Elsevier Interactive Patient Education © 2019 [employer] ° °

## 2018-08-12 ENCOUNTER — Ambulatory Visit (INDEPENDENT_AMBULATORY_CARE_PROVIDER_SITE_OTHER): Payer: BLUE CROSS/BLUE SHIELD | Admitting: Advanced Practice Midwife

## 2018-08-12 VITALS — BP 130/86 | HR 89 | Wt 170.0 lb

## 2018-08-12 DIAGNOSIS — O99343 Other mental disorders complicating pregnancy, third trimester: Secondary | ICD-10-CM

## 2018-08-12 DIAGNOSIS — O99891 Other specified diseases and conditions complicating pregnancy: Secondary | ICD-10-CM

## 2018-08-12 DIAGNOSIS — Z8659 Personal history of other mental and behavioral disorders: Secondary | ICD-10-CM

## 2018-08-12 DIAGNOSIS — O34219 Maternal care for unspecified type scar from previous cesarean delivery: Secondary | ICD-10-CM

## 2018-08-12 DIAGNOSIS — O9989 Other specified diseases and conditions complicating pregnancy, childbirth and the puerperium: Secondary | ICD-10-CM

## 2018-08-12 DIAGNOSIS — O0990 Supervision of high risk pregnancy, unspecified, unspecified trimester: Secondary | ICD-10-CM | POA: Diagnosis not present

## 2018-08-12 DIAGNOSIS — R51 Headache: Secondary | ICD-10-CM

## 2018-08-12 DIAGNOSIS — O0993 Supervision of high risk pregnancy, unspecified, third trimester: Secondary | ICD-10-CM

## 2018-08-12 DIAGNOSIS — O26893 Other specified pregnancy related conditions, third trimester: Secondary | ICD-10-CM

## 2018-08-12 DIAGNOSIS — R519 Headache, unspecified: Secondary | ICD-10-CM

## 2018-08-12 DIAGNOSIS — F419 Anxiety disorder, unspecified: Secondary | ICD-10-CM

## 2018-08-12 NOTE — Patient Instructions (Signed)
Third Trimester of Pregnancy The third trimester is from week 28 through week 40 (months 7 through 9). The third trimester is a time when the unborn baby (fetus) is growing rapidly. At the end of the ninth month, the fetus is about 20 inches in length and weighs 6-10 pounds. Body changes during your third trimester Your body will continue to go through many changes during pregnancy. The changes vary from woman to woman. During the third trimester:  Your weight will continue to increase. You can expect to gain 25-35 pounds (11-16 kg) by the end of the pregnancy.  You may begin to get stretch marks on your hips, abdomen, and breasts.  You may urinate more often because the fetus is moving lower into your pelvis and pressing on your bladder.  You may develop or continue to have heartburn. This is caused by increased hormones that slow down muscles in the digestive tract.  You may develop or continue to have constipation because increased hormones slow digestion and cause the muscles that push waste through your intestines to relax.  You may develop hemorrhoids. These are swollen veins (varicose veins) in the rectum that can itch or be painful.  You may develop swollen, bulging veins (varicose veins) in your legs.  You may have increased body aches in the pelvis, back, or thighs. This is due to weight gain and increased hormones that are relaxing your joints.  You may have changes in your hair. These can include thickening of your hair, rapid growth, and changes in texture. Some women also have hair loss during or after pregnancy, or hair that feels dry or thin. Your hair will most likely return to normal after your baby is born.  Your breasts will continue to grow and they will continue to become tender. A yellow fluid (colostrum) may leak from your breasts. This is the first milk you are producing for your baby.  Your belly button may stick out.  You may notice more swelling in your hands,  face, or ankles.  You may have increased tingling or numbness in your hands, arms, and legs. The skin on your belly may also feel numb.  You may feel short of breath because of your expanding uterus.  You may have more problems sleeping. This can be caused by the size of your belly, increased need to urinate, and an increase in your body's metabolism.  You may notice the fetus "dropping," or moving lower in your abdomen (lightening).  You may have increased vaginal discharge.  You may notice your joints feel loose and you may have pain around your pelvic bone. What to expect at prenatal visits You will have prenatal exams every 2 weeks until week 36. Then you will have weekly prenatal exams. During a routine prenatal visit:  You will be weighed to make sure you and the baby are growing normally.  Your blood pressure will be taken.  Your abdomen will be measured to track your baby's growth.  The fetal heartbeat will be listened to.  Any test results from the previous visit will be discussed.  You may have a cervical check near your due date to see if your cervix has softened or thinned (effaced).  You will be tested for Group B streptococcus. This happens between 35 and 37 weeks. Your health care provider may ask you:  What your birth plan is.  How you are feeling.  If you are feeling the baby move.  If you have had any abnormal   symptoms, such as leaking fluid, bleeding, severe headaches, or abdominal cramping.  If you are using any tobacco products, including cigarettes, chewing tobacco, and electronic cigarettes.  If you have any questions. Other tests or screenings that may be performed during your third trimester include:  Blood tests that check for low iron levels (anemia).  Fetal testing to check the health, activity level, and growth of the fetus. Testing is done if you have certain medical conditions or if there are problems during the pregnancy.  Nonstress test  (NST). This test checks the health of your baby to make sure there are no signs of problems, such as the baby not getting enough oxygen. During this test, a belt is placed around your belly. The baby is made to move, and its heart rate is monitored during movement. What is false labor? False labor is a condition in which you feel small, irregular tightenings of the muscles in the womb (contractions) that usually go away with rest, changing position, or drinking water. These are called Braxton Hicks contractions. Contractions may last for hours, days, or even weeks before true labor sets in. If contractions come at regular intervals, become more frequent, increase in intensity, or become painful, you should see your health care provider. What are the signs of labor?  Abdominal cramps.  Regular contractions that start at 10 minutes apart and become stronger and more frequent with time.  Contractions that start on the top of the uterus and spread down to the lower abdomen and back.  Increased pelvic pressure and dull back pain.  A watery or bloody mucus discharge that comes from the vagina.  Leaking of amniotic fluid. This is also known as your "water breaking." It could be a slow trickle or a gush. Let your health care provider know if it has a color or strange odor. If you have any of these signs, call your health care provider right away, even if it is before your due date. Follow these instructions at home: Medicines  Follow your health care provider's instructions regarding medicine use. Specific medicines may be either safe or unsafe to take during pregnancy.  Take a prenatal vitamin that contains at least 600 micrograms (mcg) of folic acid.  If you develop constipation, try taking a stool softener if your health care provider approves. Eating and drinking   Eat a balanced diet that includes fresh fruits and vegetables, whole grains, good sources of protein such as meat, eggs, or tofu,  and low-fat dairy. Your health care provider will help you determine the amount of weight gain that is right for you.  Avoid raw meat and uncooked cheese. These carry germs that can cause birth defects in the baby.  If you have low calcium intake from food, talk to your health care provider about whether you should take a daily calcium supplement.  Eat four or five small meals rather than three large meals a day.  Limit foods that are high in fat and processed sugars, such as fried and sweet foods.  To prevent constipation: ? Drink enough fluid to keep your urine clear or pale yellow. ? Eat foods that are high in fiber, such as fresh fruits and vegetables, whole grains, and beans. Activity  Exercise only as directed by your health care provider. Most women can continue their usual exercise routine during pregnancy. Try to exercise for 30 minutes at least 5 days a week. Stop exercising if you experience uterine contractions.  Avoid heavy lifting.  Do   not exercise in extreme heat or humidity, or at high altitudes.  Wear low-heel, comfortable shoes.  Practice good posture.  You may continue to have sex unless your health care provider tells you otherwise. Relieving pain and discomfort  Take frequent breaks and rest with your legs elevated if you have leg cramps or low back pain.  Take warm sitz baths to soothe any pain or discomfort caused by hemorrhoids. Use hemorrhoid cream if your health care provider approves.  Wear a good support bra to prevent discomfort from breast tenderness.  If you develop varicose veins: ? Wear support pantyhose or compression stockings as told by your healthcare provider. ? Elevate your feet for 15 minutes, 3-4 times a day. Prenatal care  Write down your questions. Take them to your prenatal visits.  Keep all your prenatal visits as told by your health care provider. This is important. Safety  Wear your seat belt at all times when driving.  Make  a list of emergency phone numbers, including numbers for family, friends, the hospital, and police and fire departments. General instructions  Avoid cat litter boxes and soil used by cats. These carry germs that can cause birth defects in the baby. If you have a cat, ask someone to clean the litter box for you.  Do not travel far distances unless it is absolutely necessary and only with the approval of your health care provider.  Do not use hot tubs, steam rooms, or saunas.  Do not drink alcohol.  Do not use any products that contain nicotine or tobacco, such as cigarettes and e-cigarettes. If you need help quitting, ask your health care provider.  Do not use any medicinal herbs or unprescribed drugs. These chemicals affect the formation and growth of the baby.  Do not douche or use tampons or scented sanitary pads.  Do not cross your legs for long periods of time.  To prepare for the arrival of your baby: ? Take prenatal classes to understand, practice, and ask questions about labor and delivery. ? Make a trial run to the hospital. ? Visit the hospital and tour the maternity area. ? Arrange for maternity or paternity leave through employers. ? Arrange for family and friends to take care of pets while you are in the hospital. ? Purchase a rear-facing car seat and make sure you know how to install it in your car. ? Pack your hospital bag. ? Prepare the baby's nursery. Make sure to remove all pillows and stuffed animals from the baby's crib to prevent suffocation.  Visit your dentist if you have not gone during your pregnancy. Use a soft toothbrush to brush your teeth and be gentle when you floss. Contact a health care provider if:  You are unsure if you are in labor or if your water has broken.  You become dizzy.  You have mild pelvic cramps, pelvic pressure, or nagging pain in your abdominal area.  You have lower back pain.  You have persistent nausea, vomiting, or  diarrhea.  You have an unusual or bad smelling vaginal discharge.  You have pain when you urinate. Get help right away if:  Your water breaks before 37 weeks.  You have regular contractions less than 5 minutes apart before 37 weeks.  You have a fever.  You are leaking fluid from your vagina.  You have spotting or bleeding from your vagina.  You have severe abdominal pain or cramping.  You have rapid weight loss or weight gain.  You have   shortness of breath with chest pain.  You notice sudden or extreme swelling of your face, hands, ankles, feet, or legs.  Your baby makes fewer than 10 movements in 2 hours.  You have severe headaches that do not go away when you take medicine.  You have vision changes. Summary  The third trimester is from week 28 through week 40, months 7 through 9. The third trimester is a time when the unborn baby (fetus) is growing rapidly.  During the third trimester, your discomfort may increase as you and your baby continue to gain weight. You may have abdominal, leg, and back pain, sleeping problems, and an increased need to urinate.  During the third trimester your breasts will keep growing and they will continue to become tender. A yellow fluid (colostrum) may leak from your breasts. This is the first milk you are producing for your baby.  False labor is a condition in which you feel small, irregular tightenings of the muscles in the womb (contractions) that eventually go away. These are called Braxton Hicks contractions. Contractions may last for hours, days, or even weeks before true labor sets in.  Signs of labor can include: abdominal cramps; regular contractions that start at 10 minutes apart and become stronger and more frequent with time; watery or bloody mucus discharge that comes from the vagina; increased pelvic pressure and dull back pain; and leaking of amniotic fluid. This information is not intended to replace advice given to you by your  health care provider. Make sure you discuss any questions you have with your health care provider. Document Released: 06/13/2001 Document Revised: 07/25/2016 Document Reviewed: 07/25/2016 Elsevier Interactive Patient Education  2019 [employer]  

## 2018-08-12 NOTE — Progress Notes (Signed)
   PRENATAL VISIT NOTE  Subjective:  Sabrina Rasmussen is a 28 y.o. G4P1021 at [redacted]w[redacted]d being seen today for ongoing prenatal care.  She is currently monitored for the following issues for this high-risk pregnancy and has Major depressive disorder with single episode; GAD (generalized anxiety disorder); Supervision of other normal pregnancy, antepartum; Previous cesarean delivery affecting pregnancy; Tobacco use; Constipation; History of postpartum depression, currently pregnant in third trimester; and Anxiety during pregnancy in third trimester, antepartum on their problem list.  Patient reports headache.  Contractions: Irregular. Vag. Bleeding: None.  Movement: Present. Denies leaking of fluid.   The following portions of the patient's history were reviewed and updated as appropriate: allergies, current medications, past family history, past medical history, past social history, past surgical history and problem list. Problem list updated.  Objective:   Vitals:   08/12/18 1128  BP: 130/86  Pulse: 89  Weight: 77.1 kg    Fetal Status: Fetal Heart Rate (bpm): 142 Fundal Height: 36 cm Movement: Present  Presentation: Vertex  General:  Alert, oriented and cooperative. Patient is in no acute distress.  Skin: Skin is warm and dry. No rash noted.   Cardiovascular: Normal heart rate noted  Respiratory: Normal respiratory effort, no problems with respiration noted  Abdomen: Soft, gravid, appropriate for gestational age.  Pain/Pressure: Present     Pelvic: Cervical exam performed Dilation: Fingertip Effacement (%): 60 Station: -3  Extremities: Normal range of motion.  Edema: None  Mental Status: Normal mood and affect. Normal behavior. Normal judgment and thought content.   Assessment and Plan:  Pregnancy: G4P1021 at [redacted]w[redacted]d  1. Anxiety during pregnancy in third trimester, antepartum --Doing better now, anxious about delivery and worried about PP depression, which was severe last time.  2.  Previous cesarean delivery affecting pregnancy --Plans repeat  3. Supervision of high risk pregnancy, antepartum --GC negative on 1/23 at MAU visit - Strep Gp B NAA --Vertex by Leopolds/exam today  4. History of postpartum depression, currently pregnant in third trimester --On Prozac, now taking consistently as prescribed.    5. Headache in pregnancy, third trimester --Intermittent, sometimes resolve with Tylenol, sometimes not.  Does not like Fioricet, which was prescribed previously. --Try Flexeril, prescribed previously, which may help with h/a.   --Pt normotensive today --PEC s/sx/reasons to seek care reviewed.   Term labor symptoms and general obstetric precautions including but not limited to vaginal bleeding, contractions, leaking of fluid and fetal movement were reviewed in detail with the patient. Please refer to After Visit Summary for other counseling recommendations.  Return in about 1 week (around 08/19/2018).  No future appointments.  Sharen Counter, CNM

## 2018-08-14 ENCOUNTER — Encounter (HOSPITAL_COMMUNITY): Payer: Self-pay

## 2018-08-14 LAB — STREP GP B NAA: STREP GROUP B AG: NEGATIVE

## 2018-08-20 ENCOUNTER — Other Ambulatory Visit: Payer: Self-pay

## 2018-08-20 ENCOUNTER — Ambulatory Visit (INDEPENDENT_AMBULATORY_CARE_PROVIDER_SITE_OTHER): Payer: BLUE CROSS/BLUE SHIELD | Admitting: Obstetrics and Gynecology

## 2018-08-20 ENCOUNTER — Encounter: Payer: Self-pay | Admitting: Obstetrics and Gynecology

## 2018-08-20 VITALS — BP 119/77 | HR 93 | Wt 170.0 lb

## 2018-08-20 DIAGNOSIS — Z3483 Encounter for supervision of other normal pregnancy, third trimester: Secondary | ICD-10-CM

## 2018-08-20 DIAGNOSIS — Z3A38 38 weeks gestation of pregnancy: Secondary | ICD-10-CM

## 2018-08-20 DIAGNOSIS — Z8659 Personal history of other mental and behavioral disorders: Secondary | ICD-10-CM

## 2018-08-20 DIAGNOSIS — O9989 Other specified diseases and conditions complicating pregnancy, childbirth and the puerperium: Secondary | ICD-10-CM

## 2018-08-20 DIAGNOSIS — O99891 Other specified diseases and conditions complicating pregnancy: Secondary | ICD-10-CM

## 2018-08-20 DIAGNOSIS — O34219 Maternal care for unspecified type scar from previous cesarean delivery: Secondary | ICD-10-CM

## 2018-08-20 DIAGNOSIS — Z3480 Encounter for supervision of other normal pregnancy, unspecified trimester: Secondary | ICD-10-CM

## 2018-08-20 NOTE — Patient Instructions (Signed)

## 2018-08-20 NOTE — Progress Notes (Signed)
ROB.  C/o pressure.  Patient wants to have CS.

## 2018-08-20 NOTE — Progress Notes (Signed)
Subjective:  Sabrina Rasmussen is a 28 y.o. G4P1021 at [redacted]w[redacted]d being seen today for ongoing prenatal care.  She is currently monitored for the following issues for this high-risk pregnancy and has Major depressive disorder with single episode; GAD (generalized anxiety disorder); Supervision of other normal pregnancy, antepartum; Previous cesarean delivery affecting pregnancy; Tobacco use; Constipation; History of postpartum depression, currently pregnant in third trimester; and Anxiety during pregnancy in third trimester, antepartum on their problem list.  Patient reports general discomforts of pregnancy.  Contractions: Irregular. Vag. Bleeding: None.  Movement: Present. Denies leaking of fluid.   The following portions of the patient's history were reviewed and updated as appropriate: allergies, current medications, past family history, past medical history, past social history, past surgical history and problem list. Problem list updated.  Objective:   Vitals:   08/20/18 1056  BP: 119/77  Pulse: 93  Weight: 170 lb (77.1 kg)    Fetal Status: Fetal Heart Rate (bpm): 143   Movement: Present     General:  Alert, oriented and cooperative. Patient is in no acute distress.  Skin: Skin is warm and dry. No rash noted.   Cardiovascular: Normal heart rate noted  Respiratory: Normal respiratory effort, no problems with respiration noted  Abdomen: Soft, gravid, appropriate for gestational age. Pain/Pressure: Present     Pelvic:  Cervical exam deferred        Extremities: Normal range of motion.  Edema: Trace  Mental Status: Normal mood and affect. Normal behavior. Normal judgment and thought content.   Urinalysis:      Assessment and Plan:  Pregnancy: G4P1021 at [redacted]w[redacted]d  1. Supervision of other normal pregnancy, antepartum Stable  2. Previous cesarean delivery affecting pregnancy Repeat on 08/27/18  3. History of postpartum depression, currently pregnant in third trimester Stable Continue with  Prozac  Term labor symptoms and general obstetric precautions including but not limited to vaginal bleeding, contractions, leaking of fluid and fetal movement were reviewed in detail with the patient. Please refer to After Visit Summary for other counseling recommendations.  Return in about 2 weeks (around 09/03/2018).   Hermina Staggers, MD

## 2018-08-22 ENCOUNTER — Encounter (HOSPITAL_COMMUNITY): Payer: Self-pay | Admitting: *Deleted

## 2018-08-22 ENCOUNTER — Inpatient Hospital Stay (HOSPITAL_COMMUNITY)
Admission: AD | Admit: 2018-08-22 | Discharge: 2018-08-22 | Disposition: A | Discharge status: Home / Self Care | Payer: Medicaid Other | Attending: Obstetrics and Gynecology | Admitting: Obstetrics and Gynecology

## 2018-08-22 DIAGNOSIS — Z3689 Encounter for other specified antenatal screening: Secondary | ICD-10-CM

## 2018-08-22 DIAGNOSIS — Z3A38 38 weeks gestation of pregnancy: Secondary | ICD-10-CM | POA: Insufficient documentation

## 2018-08-22 DIAGNOSIS — R109 Unspecified abdominal pain: Secondary | ICD-10-CM | POA: Diagnosis present

## 2018-08-22 DIAGNOSIS — O479 False labor, unspecified: Secondary | ICD-10-CM | POA: Diagnosis not present

## 2018-08-22 DIAGNOSIS — R0689 Other abnormalities of breathing: Secondary | ICD-10-CM | POA: Diagnosis not present

## 2018-08-22 DIAGNOSIS — O471 False labor at or after 37 completed weeks of gestation: Secondary | ICD-10-CM | POA: Diagnosis not present

## 2018-08-22 DIAGNOSIS — Z3480 Encounter for supervision of other normal pregnancy, unspecified trimester: Secondary | ICD-10-CM

## 2018-08-22 DIAGNOSIS — R52 Pain, unspecified: Secondary | ICD-10-CM | POA: Diagnosis not present

## 2018-08-22 LAB — URINALYSIS, ROUTINE W REFLEX MICROSCOPIC
Bilirubin Urine: NEGATIVE
GLUCOSE, UA: NEGATIVE mg/dL
Hgb urine dipstick: NEGATIVE
Ketones, ur: NEGATIVE mg/dL
Leukocytes,Ua: NEGATIVE
Nitrite: NEGATIVE
Protein, ur: NEGATIVE mg/dL
Specific Gravity, Urine: 1.01 (ref 1.005–1.030)
pH: 6.5 (ref 5.0–8.0)

## 2018-08-22 LAB — RAPID URINE DRUG SCREEN, HOSP PERFORMED
Amphetamines: NOT DETECTED
Barbiturates: NOT DETECTED
Benzodiazepines: NOT DETECTED
Cocaine: NOT DETECTED
Opiates: NOT DETECTED
TETRAHYDROCANNABINOL: NOT DETECTED

## 2018-08-22 MED ORDER — ACETAMINOPHEN 500 MG PO TABS: 1000.0000 mg | ORAL_TABLET | Freq: Once | ORAL | Status: DC

## 2018-08-22 MED ORDER — OXYCODONE-ACETAMINOPHEN 5-325 MG PO TABS
2.0000 | ORAL_TABLET | Freq: Once | ORAL | Status: AC
  Administered 2018-08-22: 2 via ORAL
  Filled 2018-08-22: qty 2

## 2018-08-22 NOTE — Discharge Instructions (Signed)

## 2018-08-22 NOTE — MAU Note (Signed)
Pt arrived EMS ctx q 4 min. Uncomfortable .Denies any vag bleeding or leaking.

## 2018-08-22 NOTE — Progress Notes (Signed)
E signature pad not working in room 6.  Patient signed paper copy of AVS.
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
## 2018-08-22 NOTE — Progress Notes (Signed)
2 Percocet given at 1827.  Pt states pain has improved and would like to go home.  No longer c/o visual changes or pain under right breast.  Headache down to 5 from 9/10.  No change in SVE.  Pt to be discharged home.  IV removed.  Patient verbalized understanding of dc instructions.

## 2018-08-24 ENCOUNTER — Inpatient Hospital Stay (HOSPITAL_COMMUNITY)
Admission: AD | Admit: 2018-08-24 | Discharge: 2018-08-26 | DRG: 787 | Disposition: A | Discharge status: Home / Self Care | Payer: Medicaid Other | Attending: Obstetrics & Gynecology | Admitting: Obstetrics & Gynecology

## 2018-08-24 ENCOUNTER — Inpatient Hospital Stay (HOSPITAL_COMMUNITY): Payer: Medicaid Other | Admitting: Anesthesiology

## 2018-08-24 ENCOUNTER — Encounter (HOSPITAL_COMMUNITY): Payer: Self-pay

## 2018-08-24 ENCOUNTER — Encounter (HOSPITAL_COMMUNITY)
Admission: AD | Disposition: A | Discharge status: Home / Self Care | Payer: Self-pay | Source: Home / Self Care | Attending: Obstetrics & Gynecology

## 2018-08-24 DIAGNOSIS — O26893 Other specified pregnancy related conditions, third trimester: Secondary | ICD-10-CM | POA: Diagnosis present

## 2018-08-24 DIAGNOSIS — Z87891 Personal history of nicotine dependence: Secondary | ICD-10-CM

## 2018-08-24 DIAGNOSIS — O99324 Drug use complicating childbirth: Secondary | ICD-10-CM | POA: Diagnosis present

## 2018-08-24 DIAGNOSIS — Z8659 Personal history of other mental and behavioral disorders: Secondary | ICD-10-CM

## 2018-08-24 DIAGNOSIS — F419 Anxiety disorder, unspecified: Secondary | ICD-10-CM | POA: Diagnosis present

## 2018-08-24 DIAGNOSIS — Z3480 Encounter for supervision of other normal pregnancy, unspecified trimester: Secondary | ICD-10-CM

## 2018-08-24 DIAGNOSIS — O34219 Maternal care for unspecified type scar from previous cesarean delivery: Secondary | ICD-10-CM | POA: Diagnosis present

## 2018-08-24 DIAGNOSIS — Z720 Tobacco use: Secondary | ICD-10-CM | POA: Diagnosis present

## 2018-08-24 DIAGNOSIS — O99891 Other specified diseases and conditions complicating pregnancy: Secondary | ICD-10-CM

## 2018-08-24 DIAGNOSIS — O34211 Maternal care for low transverse scar from previous cesarean delivery: Secondary | ICD-10-CM | POA: Diagnosis not present

## 2018-08-24 DIAGNOSIS — O9989 Other specified diseases and conditions complicating pregnancy, childbirth and the puerperium: Secondary | ICD-10-CM

## 2018-08-24 DIAGNOSIS — F1290 Cannabis use, unspecified, uncomplicated: Secondary | ICD-10-CM | POA: Diagnosis present

## 2018-08-24 DIAGNOSIS — Z98891 History of uterine scar from previous surgery: Secondary | ICD-10-CM

## 2018-08-24 DIAGNOSIS — O99343 Other mental disorders complicating pregnancy, third trimester: Secondary | ICD-10-CM

## 2018-08-24 DIAGNOSIS — Z3A38 38 weeks gestation of pregnancy: Secondary | ICD-10-CM | POA: Diagnosis not present

## 2018-08-24 LAB — TYPE AND SCREEN
ABO/RH(D): O POS
Antibody Screen: NEGATIVE

## 2018-08-24 LAB — CBC
HCT: 32.1 % — ABNORMAL LOW (ref 36.0–46.0)
Hemoglobin: 11.1 g/dL — ABNORMAL LOW (ref 12.0–15.0)
MCH: 33.1 pg (ref 26.0–34.0)
MCHC: 34.6 g/dL (ref 30.0–36.0)
MCV: 95.8 fL (ref 80.0–100.0)
PLATELETS: 236 10*3/uL (ref 150–400)
RBC: 3.35 MIL/uL — AB (ref 3.87–5.11)
RDW: 14.2 % (ref 11.5–15.5)
WBC: 8.3 10*3/uL (ref 4.0–10.5)
nRBC: 0.4 % — ABNORMAL HIGH (ref 0.0–0.2)

## 2018-08-24 LAB — POCT FERN TEST: POCT Fern Test: POSITIVE

## 2018-08-24 SURGERY — Surgical Case
Anesthesia: Spinal

## 2018-08-24 SURGERY — Surgical Case
Anesthesia: Choice

## 2018-08-24 MED ORDER — OXYTOCIN 10 UNIT/ML IJ SOLN
INTRAVENOUS | Status: DC | PRN
  Administered 2018-08-24: 40 [IU] via INTRAVENOUS

## 2018-08-24 MED ORDER — LACTATED RINGERS IV SOLN: INTRAVENOUS | Status: DC

## 2018-08-24 MED ORDER — DIPHENHYDRAMINE HCL 25 MG PO CAPS: 25.0000 mg | ORAL_CAPSULE | Freq: Four times a day (QID) | ORAL | Status: DC | PRN

## 2018-08-24 MED ORDER — SCOPOLAMINE 1 MG/3DAYS TD PT72
1.0000 | MEDICATED_PATCH | Freq: Once | TRANSDERMAL | Status: DC
  Filled 2018-08-24: qty 1

## 2018-08-24 MED ORDER — OXYCODONE HCL 5 MG PO TABS
5.0000 mg | ORAL_TABLET | ORAL | Status: DC | PRN
  Administered 2018-08-25 – 2018-08-26 (×7): 10 mg via ORAL
  Filled 2018-08-24: qty 2
  Filled 2018-08-24: qty 1
  Filled 2018-08-24 (×3): qty 2
  Filled 2018-08-24: qty 1
  Filled 2018-08-24 (×2): qty 2

## 2018-08-24 MED ORDER — SODIUM CHLORIDE 0.9 % IR SOLN
Status: DC | PRN
  Administered 2018-08-24: 1000 mL
  Administered 2018-08-24: 200 mL

## 2018-08-24 MED ORDER — MORPHINE SULFATE (PF) 0.5 MG/ML IJ SOLN
INTRAMUSCULAR | Status: AC
  Filled 2018-08-24: qty 10

## 2018-08-24 MED ORDER — NALBUPHINE HCL 10 MG/ML IJ SOLN
5.0000 mg | INTRAMUSCULAR | Status: DC | PRN
  Administered 2018-08-25: 5 mg via INTRAVENOUS
  Filled 2018-08-24: qty 1

## 2018-08-24 MED ORDER — ONDANSETRON HCL 4 MG/2ML IJ SOLN: 4.0000 mg | Freq: Three times a day (TID) | INTRAMUSCULAR | Status: DC | PRN

## 2018-08-24 MED ORDER — CEFAZOLIN SODIUM-DEXTROSE 2-4 GM/100ML-% IV SOLN: 2.0000 g | INTRAVENOUS | Status: DC

## 2018-08-24 MED ORDER — PRENATAL MULTIVITAMIN CH
1.0000 | ORAL_TABLET | Freq: Every day | ORAL | Status: DC
  Administered 2018-08-24 – 2018-08-26 (×3): 1 via ORAL
  Filled 2018-08-24 (×3): qty 1

## 2018-08-24 MED ORDER — SOD CITRATE-CITRIC ACID 500-334 MG/5ML PO SOLN
ORAL | Status: AC
  Administered 2018-08-24: 30 mL
  Filled 2018-08-24: qty 15

## 2018-08-24 MED ORDER — NALBUPHINE HCL 10 MG/ML IJ SOLN
5.0000 mg | INTRAMUSCULAR | Status: DC | PRN
  Filled 2018-08-24: qty 1

## 2018-08-24 MED ORDER — CEFAZOLIN SODIUM-DEXTROSE 2-4 GM/100ML-% IV SOLN
2.0000 g | Freq: Once | INTRAVENOUS | Status: AC
  Administered 2018-08-24: 2 g via INTRAVENOUS

## 2018-08-24 MED ORDER — NALBUPHINE HCL 10 MG/ML IJ SOLN
5.0000 mg | Freq: Once | INTRAMUSCULAR | Status: AC | PRN
  Administered 2018-08-24: 5 mg via INTRAVENOUS

## 2018-08-24 MED ORDER — IBUPROFEN 800 MG PO TABS
800.0000 mg | ORAL_TABLET | Freq: Four times a day (QID) | ORAL | Status: DC
  Administered 2018-08-25 – 2018-08-26 (×3): 800 mg via ORAL
  Filled 2018-08-24 (×4): qty 1

## 2018-08-24 MED ORDER — ONDANSETRON HCL 4 MG/2ML IJ SOLN
INTRAMUSCULAR | Status: AC
  Filled 2018-08-24: qty 2

## 2018-08-24 MED ORDER — FENTANYL CITRATE (PF) 100 MCG/2ML IJ SOLN
INTRAMUSCULAR | Status: AC
  Administered 2018-08-24: 25 ug via INTRAVENOUS
  Filled 2018-08-24: qty 2

## 2018-08-24 MED ORDER — WITCH HAZEL-GLYCERIN EX PADS: 1.0000 "application " | MEDICATED_PAD | CUTANEOUS | Status: DC | PRN

## 2018-08-24 MED ORDER — LACTATED RINGERS IV SOLN
INTRAVENOUS | Status: DC
  Administered 2018-08-24 (×2): via INTRAVENOUS

## 2018-08-24 MED ORDER — NALBUPHINE HCL 10 MG/ML IJ SOLN: 5.0000 mg | Freq: Once | INTRAMUSCULAR | Status: AC | PRN

## 2018-08-24 MED ORDER — LACTATED RINGERS IV SOLN
INTRAVENOUS | Status: DC | PRN
  Administered 2018-08-24 (×3): via INTRAVENOUS

## 2018-08-24 MED ORDER — SCOPOLAMINE 1 MG/3DAYS TD PT72
MEDICATED_PATCH | TRANSDERMAL | Status: DC | PRN
  Administered 2018-08-24: 1 via TRANSDERMAL

## 2018-08-24 MED ORDER — SENNOSIDES-DOCUSATE SODIUM 8.6-50 MG PO TABS
2.0000 | ORAL_TABLET | ORAL | Status: DC
  Administered 2018-08-24 – 2018-08-25 (×2): 2 via ORAL
  Filled 2018-08-24 (×2): qty 2

## 2018-08-24 MED ORDER — GABAPENTIN 100 MG PO CAPS
100.0000 mg | ORAL_CAPSULE | Freq: Two times a day (BID) | ORAL | Status: DC
  Administered 2018-08-24 – 2018-08-26 (×3): 100 mg via ORAL
  Filled 2018-08-24 (×8): qty 1

## 2018-08-24 MED ORDER — MEASLES, MUMPS & RUBELLA VAC IJ SOLR
0.5000 mL | Freq: Once | INTRAMUSCULAR | Status: DC
  Filled 2018-08-24: qty 0.5

## 2018-08-24 MED ORDER — ZOLPIDEM TARTRATE 5 MG PO TABS
5.0000 mg | ORAL_TABLET | Freq: Every evening | ORAL | Status: DC | PRN
  Administered 2018-08-24: 5 mg via ORAL
  Filled 2018-08-24 (×2): qty 1

## 2018-08-24 MED ORDER — DEXAMETHASONE SODIUM PHOSPHATE 4 MG/ML IJ SOLN
INTRAMUSCULAR | Status: DC | PRN
  Administered 2018-08-24: 4 mg via INTRAVENOUS

## 2018-08-24 MED ORDER — FENTANYL CITRATE (PF) 100 MCG/2ML IJ SOLN
INTRAMUSCULAR | Status: AC
  Filled 2018-08-24: qty 2

## 2018-08-24 MED ORDER — SIMETHICONE 80 MG PO CHEW
80.0000 mg | CHEWABLE_TABLET | ORAL | Status: DC
  Administered 2018-08-24 – 2018-08-25 (×2): 80 mg via ORAL
  Filled 2018-08-24 (×2): qty 1

## 2018-08-24 MED ORDER — KETOROLAC TROMETHAMINE 30 MG/ML IJ SOLN: 30.0000 mg | Freq: Four times a day (QID) | INTRAMUSCULAR | Status: AC | PRN

## 2018-08-24 MED ORDER — PHENYLEPHRINE 8 MG IN D5W 100 ML (0.08MG/ML) PREMIX OPTIME
INJECTION | INTRAVENOUS | Status: DC | PRN
  Administered 2018-08-24: 60 ug/min via INTRAVENOUS

## 2018-08-24 MED ORDER — SCOPOLAMINE 1 MG/3DAYS TD PT72
MEDICATED_PATCH | TRANSDERMAL | Status: AC
  Filled 2018-08-24: qty 1

## 2018-08-24 MED ORDER — CELECOXIB 400 MG PO CAPS
400.0000 mg | ORAL_CAPSULE | Freq: Once | ORAL | Status: AC
  Administered 2018-08-24: 400 mg via ORAL
  Filled 2018-08-24: qty 1

## 2018-08-24 MED ORDER — TETANUS-DIPHTH-ACELL PERTUSSIS 5-2.5-18.5 LF-MCG/0.5 IM SUSP: 0.5000 mL | Freq: Once | INTRAMUSCULAR | Status: DC

## 2018-08-24 MED ORDER — MORPHINE SULFATE (PF) 0.5 MG/ML IJ SOLN
INTRAMUSCULAR | Status: DC | PRN
  Administered 2018-08-24: .15 mg via INTRATHECAL

## 2018-08-24 MED ORDER — FENTANYL CITRATE (PF) 100 MCG/2ML IJ SOLN
INTRAMUSCULAR | Status: DC | PRN
  Administered 2018-08-24: 15 ug via INTRATHECAL

## 2018-08-24 MED ORDER — KETOROLAC TROMETHAMINE 30 MG/ML IJ SOLN
30.0000 mg | Freq: Four times a day (QID) | INTRAMUSCULAR | Status: AC
  Administered 2018-08-24 – 2018-08-25 (×4): 30 mg via INTRAVENOUS
  Filled 2018-08-24 (×4): qty 1

## 2018-08-24 MED ORDER — DIBUCAINE 1 % RE OINT: 1.0000 "application " | TOPICAL_OINTMENT | RECTAL | Status: DC | PRN

## 2018-08-24 MED ORDER — DEXAMETHASONE SODIUM PHOSPHATE 4 MG/ML IJ SOLN
INTRAMUSCULAR | Status: AC
  Filled 2018-08-24: qty 1

## 2018-08-24 MED ORDER — NALOXONE HCL 4 MG/10ML IJ SOLN
1.0000 ug/kg/h | INTRAVENOUS | Status: DC | PRN
  Filled 2018-08-24: qty 5

## 2018-08-24 MED ORDER — GABAPENTIN 300 MG PO CAPS
900.0000 mg | ORAL_CAPSULE | Freq: Once | ORAL | Status: AC
  Administered 2018-08-24: 900 mg via ORAL
  Filled 2018-08-24: qty 3

## 2018-08-24 MED ORDER — BUPIVACAINE IN DEXTROSE 0.75-8.25 % IT SOLN
INTRATHECAL | Status: DC | PRN
  Administered 2018-08-24: 1.4 mg via INTRATHECAL

## 2018-08-24 MED ORDER — PHENYLEPHRINE 8 MG IN D5W 100 ML (0.08MG/ML) PREMIX OPTIME
INJECTION | INTRAVENOUS | Status: AC
  Filled 2018-08-24: qty 100

## 2018-08-24 MED ORDER — ONDANSETRON HCL 4 MG/2ML IJ SOLN
INTRAMUSCULAR | Status: DC | PRN
  Administered 2018-08-24: 4 mg via INTRAVENOUS

## 2018-08-24 MED ORDER — MEPERIDINE HCL 25 MG/ML IJ SOLN: 6.2500 mg | INTRAMUSCULAR | Status: DC | PRN

## 2018-08-24 MED ORDER — COCONUT OIL OIL: 1.0000 "application " | TOPICAL_OIL | Status: DC | PRN

## 2018-08-24 MED ORDER — DIPHENHYDRAMINE HCL 50 MG/ML IJ SOLN: 12.5000 mg | INTRAMUSCULAR | Status: DC | PRN

## 2018-08-24 MED ORDER — ENOXAPARIN SODIUM 40 MG/0.4ML ~~LOC~~ SOLN: 40.0000 mg | SUBCUTANEOUS | Status: DC

## 2018-08-24 MED ORDER — SODIUM CHLORIDE 0.9% FLUSH: 3.0000 mL | INTRAVENOUS | Status: DC | PRN

## 2018-08-24 MED ORDER — ACETAMINOPHEN 500 MG PO TABS
1000.0000 mg | ORAL_TABLET | Freq: Four times a day (QID) | ORAL | Status: AC
  Administered 2018-08-24 (×3): 1000 mg via ORAL
  Filled 2018-08-24 (×3): qty 2

## 2018-08-24 MED ORDER — OXYTOCIN 40 UNITS IN NORMAL SALINE INFUSION - SIMPLE MED: 2.5000 [IU]/h | INTRAVENOUS | Status: AC

## 2018-08-24 MED ORDER — OXYTOCIN 10 UNIT/ML IJ SOLN
INTRAMUSCULAR | Status: AC
  Filled 2018-08-24: qty 4

## 2018-08-24 MED ORDER — FENTANYL CITRATE (PF) 100 MCG/2ML IJ SOLN
25.0000 ug | INTRAMUSCULAR | Status: DC | PRN
  Administered 2018-08-24 (×2): 25 ug via INTRAVENOUS

## 2018-08-24 MED ORDER — MENTHOL 3 MG MT LOZG: 1.0000 | LOZENGE | OROMUCOSAL | Status: DC | PRN

## 2018-08-24 MED ORDER — METOCLOPRAMIDE HCL 5 MG/ML IJ SOLN: 10.0000 mg | Freq: Once | INTRAMUSCULAR | Status: DC | PRN

## 2018-08-24 MED ORDER — GABAPENTIN 300 MG PO CAPS: 900.0000 mg | ORAL_CAPSULE | ORAL | Status: DC

## 2018-08-24 MED ORDER — DIPHENHYDRAMINE HCL 25 MG PO CAPS: 25.0000 mg | ORAL_CAPSULE | ORAL | Status: DC | PRN

## 2018-08-24 MED ORDER — NALOXONE HCL 0.4 MG/ML IJ SOLN: 0.4000 mg | INTRAMUSCULAR | Status: DC | PRN

## 2018-08-24 MED ORDER — SIMETHICONE 80 MG PO CHEW
80.0000 mg | CHEWABLE_TABLET | Freq: Three times a day (TID) | ORAL | Status: DC
  Administered 2018-08-24 – 2018-08-26 (×5): 80 mg via ORAL
  Filled 2018-08-24 (×5): qty 1

## 2018-08-24 MED ORDER — CELECOXIB 400 MG PO CAPS: 400.0000 mg | ORAL_CAPSULE | ORAL | Status: DC

## 2018-08-24 MED ORDER — SIMETHICONE 80 MG PO CHEW: 80.0000 mg | CHEWABLE_TABLET | ORAL | Status: DC | PRN

## 2018-08-24 SURGICAL SUPPLY — 38 items
CHLORAPREP W/TINT 26ML (MISCELLANEOUS) ×3 IMPLANT
CLAMP CORD UMBIL (MISCELLANEOUS) IMPLANT
CLOSURE STERI STRIP 1/2 X4 (GAUZE/BANDAGES/DRESSINGS) ×3 IMPLANT
CLOTH BEACON ORANGE TIMEOUT ST (SAFETY) ×3 IMPLANT
DERMABOND ADVANCED (GAUZE/BANDAGES/DRESSINGS) ×4
DERMABOND ADVANCED .7 DNX12 (GAUZE/BANDAGES/DRESSINGS) ×2 IMPLANT
DRSG OPSITE POSTOP 4X10 (GAUZE/BANDAGES/DRESSINGS) ×3 IMPLANT
ELECT REM PT RETURN 9FT ADLT (ELECTROSURGICAL) ×3
ELECTRODE REM PT RTRN 9FT ADLT (ELECTROSURGICAL) ×1 IMPLANT
EXTRACTOR VACUUM BELL STYLE (SUCTIONS) IMPLANT
GLOVE BIOGEL PI IND STRL 7.0 (GLOVE) ×1 IMPLANT
GLOVE BIOGEL PI IND STRL 8 (GLOVE) ×1 IMPLANT
GLOVE BIOGEL PI INDICATOR 7.0 (GLOVE) ×2
GLOVE BIOGEL PI INDICATOR 8 (GLOVE) ×2
GLOVE ECLIPSE 8.0 STRL XLNG CF (GLOVE) ×3 IMPLANT
GOWN STRL REUS W/TWL LRG LVL3 (GOWN DISPOSABLE) ×6 IMPLANT
KIT ABG SYR 3ML LUER SLIP (SYRINGE) ×3 IMPLANT
NEEDLE HYPO 18GX1.5 BLUNT FILL (NEEDLE) ×3 IMPLANT
NEEDLE HYPO 22GX1.5 SAFETY (NEEDLE) ×3 IMPLANT
NEEDLE HYPO 25X5/8 SAFETYGLIDE (NEEDLE) ×3 IMPLANT
NS IRRIG 1000ML POUR BTL (IV SOLUTION) ×3 IMPLANT
PACK C SECTION WH (CUSTOM PROCEDURE TRAY) ×3 IMPLANT
PAD OB MATERNITY 4.3X12.25 (PERSONAL CARE ITEMS) ×3 IMPLANT
PENCIL SMOKE EVAC W/HOLSTER (ELECTROSURGICAL) ×3 IMPLANT
RTRCTR C-SECT PINK 25CM LRG (MISCELLANEOUS) IMPLANT
SUT CHROMIC 0 CT 1 (SUTURE) ×3 IMPLANT
SUT MNCRL 0 VIOLET CTX 36 (SUTURE) ×2 IMPLANT
SUT MONOCRYL 0 CTX 36 (SUTURE) ×4
SUT PLAIN 2 0 (SUTURE)
SUT PLAIN 2 0 XLH (SUTURE) IMPLANT
SUT PLAIN ABS 2-0 CT1 27XMFL (SUTURE) IMPLANT
SUT VIC AB 0 CTX 36 (SUTURE) ×2
SUT VIC AB 0 CTX36XBRD ANBCTRL (SUTURE) ×1 IMPLANT
SUT VIC AB 4-0 KS 27 (SUTURE) IMPLANT
SYR 20CC LL (SYRINGE) ×6 IMPLANT
TOWEL OR 17X24 6PK STRL BLUE (TOWEL DISPOSABLE) ×3 IMPLANT
TRAY FOLEY W/BAG SLVR 14FR LF (SET/KITS/TRAYS/PACK) IMPLANT
WATER STERILE IRR 1000ML POUR (IV SOLUTION) ×3 IMPLANT

## 2018-08-24 NOTE — Anesthesia Preprocedure Evaluation (Signed)
Anesthesia Evaluation  Patient identified by MRN, date of birth, ID band Patient awake    Reviewed: Allergy & Precautions, NPO status , Patient's Chart, lab work & pertinent test results  Airway Mallampati: II  TM Distance: >3 FB Neck ROM: Full    Dental no notable dental hx.    Pulmonary neg pulmonary ROS, former smoker,    Pulmonary exam normal breath sounds clear to auscultation       Cardiovascular negative cardio ROS Normal cardiovascular exam Rhythm:Regular Rate:Normal     Neuro/Psych negative neurological ROS  negative psych ROS   GI/Hepatic negative GI ROS, Neg liver ROS,   Endo/Other  negative endocrine ROS  Renal/GU negative Renal ROS  negative genitourinary   Musculoskeletal negative musculoskeletal ROS (+)   Abdominal   Peds negative pediatric ROS (+)  Hematology negative hematology ROS (+)   Anesthesia Other Findings   Reproductive/Obstetrics (+) Pregnancy                             Anesthesia Physical Anesthesia Plan  ASA: II and emergent  Anesthesia Plan: Spinal   Post-op Pain Management:    Induction:   PONV Risk Score and Plan: 3 and Ondansetron, Treatment may vary due to age or medical condition and Scopolamine patch - Pre-op  Airway Management Planned: Natural Airway  Additional Equipment:   Intra-op Plan:   Post-operative Plan:   Informed Consent: I have reviewed the patients History and Physical, chart, labs and discussed the procedure including the risks, benefits and alternatives for the proposed anesthesia with the patient or authorized representative who has indicated his/her understanding and acceptance.     Dental advisory given  Plan Discussed with:   Anesthesia Plan Comments:         Anesthesia Quick Evaluation

## 2018-08-24 NOTE — Anesthesia Postprocedure Evaluation (Signed)
Anesthesia Post Note  Patient: Sabrina Rasmussen  Procedure(s) Performed: CESAREAN SECTION (N/A )     Patient location during evaluation: Mother Baby Anesthesia Type: Spinal Level of consciousness: awake and alert Pain management: pain level controlled Vital Signs Assessment: post-procedure vital signs reviewed and stable Respiratory status: spontaneous breathing Cardiovascular status: stable Postop Assessment: spinal receding, able to ambulate, no apparent nausea or vomiting and no headache Anesthetic complications: no Comments: Pain score 4.    Last Vitals:  Vitals:   08/24/18 1512 08/24/18 1515  BP: 132/75 114/84  Pulse: 91 100  Resp:    Temp:    SpO2: 98% 98%    Last Pain:  Vitals:   08/24/18 1530  TempSrc:   PainSc: 4    Pain Goal: Patients Stated Pain Goal: 0 (08/24/18 0233)                 Merrilyn Puma

## 2018-08-24 NOTE — Op Note (Signed)
Cesarean Section Operative Report  Sabrina Rasmussen  PROCEDURE DATE: 08/24/2018  PREOPERATIVE DIAGNOSES: Intrauterine pregnancy at [redacted]w[redacted]d weeks gestation; patient declines vag del attempt after SROM   POSTOPERATIVE DIAGNOSES: The same  PROCEDURE: Repeat Low Transverse Cesarean Section  SURGEON:   Surgeon(s) and Role:    * Eure, Amaryllis Dyke, MD - Primary    * Arvilla Market, DO - Fellow  INDICATIONS: Sabrina Rasmussen is a 29 y.o. [phone] at [redacted]w[redacted]d here for cesarean section secondary to the indications listed under preoperative diagnoses; please see preoperative note for further details.  The risks of cesarean section were discussed with the patient including but were not limited to: bleeding which may require transfusion or reoperation; infection which may require antibiotics; injury to bowel, bladder, ureters or other surrounding organs; injury to the fetus; need for additional procedures including hysterectomy in the event of a life-threatening hemorrhage; placental abnormalities wth subsequent pregnancies, incisional problems, thromboembolic phenomenon and other postoperative/anesthesia complications.   The patient concurred with the proposed plan, giving informed written consent for the procedure.    FINDINGS:  Viable female infant in cephalic presentation.  Apgars 8 and 9.  Clear amniotic fluid.  Intact placenta, three vessel cord.  Normal uterus, fallopian tubes and ovaries bilaterally. Very thin lower uterine segment.   ANESTHESIA: Spinal INTRAVENOUS FLUIDS: 2200 mL  ESTIMATED BLOOD LOSS: 103 mL URINE OUTPUT:  250 ml SPECIMENS: Placenta sent to L&D COMPLICATIONS: None immediate  PROCEDURE IN DETAIL:  The patient preoperatively received intravenous antibiotics and had sequential compression devices applied to her lower extremities.  She was then taken to the operating room where spinal anesthesia was administered and was found to be adequate. She was then placed in a dorsal supine  position with a leftward tilt, and prepped and draped in a sterile manner.  A foley catheter was placed into her bladder and attached to constant gravity.     A time out was held and the above information confirmed.  A transverse was made and carried down through the subcutaneous tissue to the fascia. Fascial incision was made and extended transversely. The fascia was separated from the underlying rectus tissue superiorly and inferiorly. The peritoneum was identified and entered. Peritoneal incision was extended longitudinally.  A low transverse hysterotomy was made with a scalpel and extended bilaterally bluntly.  The infant was successfully delivered vertex, the cord was clamped and cut after one minute, and the infant was handed over to the awaiting neonatology team. Cord blood was obtained for evaluation. The placenta was delivered intact and appeared normal. The uterine outline, tubes and ovaries appeared normal. The uterine incision was closed with running locked sutures of 0 Monocryl. Hemostasis was observed. Lavage was carried out until clear. Two interrupted sutures with 0 chromic were placed to re-approximate peritoneum and rectus muscles.The fascia was then closed using 0 Vicryl in a running fashion.  The subcutaneous layer was irrigated, and  the skin was closed with a 4-0 Vicryl subcuticular stitch.    Disposition: PACU - hemodynamically stable.   Maternal Condition: stable   Marcy Siren, D.O. OB Fellow  08/24/2018, 5:43 AM

## 2018-08-24 NOTE — Lactation Note (Signed)
This note was copied from a baby's chart. Lactation Consultation Note  Patient Name: Sabrina Rasmussen [id number] Date: 08/24/2018 Reason for consult: Initial assessment  Visited with P2 Mom of ET infant at 8 hrs post cesarean delivery.  Mom suffers from mood disorder and history of PPD with last baby.  Mom appears anxious and uncomfortable.    Discussed what Mom's plan is with regards to breastfeeding her baby.  Baby latched on 30 mins for initial feeding, but has fed baby 2 bottles of formula.  Mom uncertain what she wants to do as she is concerned about the medication she is taking, and also she doesn't feel like she has any milk for baby.  Reviewed basics.  Baby fed 10 ml of formula a little over an hour ago.  Baby spit medium amount, and Aunt of baby changing baby's clothing.    Offered to assist with postioning and latching.  Reviewed breast massage and hand expression.  Colostrum easily expressed from both breasts.  Talked about supply meets demand.  Baby undressed and placed STS across chest.  Tried in football hold, but baby not interested in latching currently.  Recommended she keep baby STS on her chest, and watch for feeding cues.  Advised to ask for help with latching, prior to giving formula at next feeding.    Mom concerned about her discomfort and RN notified of need for pain medication.    Lactation brochure left with Mom.  Mom aware of IP and OP lactation support offered.   Consult Status Consult Status: Follow-up Date: 08/25/18 Follow-up type: In-patient    Judee Clara 08/24/2018, 1:19 PM

## 2018-08-24 NOTE — Anesthesia Postprocedure Evaluation (Deleted)
Anesthesia Post Note  Patient: Sabrina Rasmussen  Procedure(s) Performed: CESAREAN SECTION (N/A )     Patient location during evaluation: Mother Baby Anesthesia Type: Spinal Level of consciousness: awake and alert, oriented and patient cooperative Pain management: pain level controlled Vital Signs Assessment: post-procedure vital signs reviewed and stable Respiratory status: spontaneous breathing Cardiovascular status: stable Postop Assessment: no headache, epidural receding, patient able to bend at knees and no signs of nausea or vomiting Anesthetic complications: no Comments: Pain score 4.    Last Vitals:  Vitals:   08/24/18 1512 08/24/18 1515  BP: 132/75 114/84  Pulse: 91 100  Resp:    Temp:    SpO2: 98% 98%    Last Pain:  Vitals:   08/24/18 1530  TempSrc:   PainSc: 4    Pain Goal: Patients Stated Pain Goal: 0 (08/24/18 0233)                 Merrilyn Puma

## 2018-08-24 NOTE — MAU Note (Signed)
About ago while in bed heard a pop and stated leaking clear fld. Thinks is still leaking fld. Having some ctxs and feels the pain in lower back.

## 2018-08-24 NOTE — Anesthesia Procedure Notes (Signed)
Spinal  Patient location during procedure: OR Staffing Anesthesiologist: Jaikob Borgwardt, MD Performed: anesthesiologist  Preanesthetic Checklist Completed: patient identified, site marked, surgical consent, pre-op evaluation, timeout performed, IV checked, risks and benefits discussed and monitors and equipment checked Spinal Block Patient position: sitting Prep: DuraPrep Patient monitoring: heart rate, continuous pulse ox and blood pressure Approach: right paramedian Location: L3-4 Injection technique: single-shot Needle Needle type: Sprotte  Needle gauge: 24 G Needle length: 9 cm Additional Notes Expiration date of kit checked and confirmed. Patient tolerated procedure well, without complications.       

## 2018-08-24 NOTE — Progress Notes (Signed)
CSW received consult for hx of marijuana use.  Referral was screened out due to the following: ~MOB had no documented substance use after initial prenatal visit/+UPT. ~MOB had no positive drug screens after initial prenatal visit/+UPT. ~Baby's UDS is negative.  Please consult CSW if current concerns arise or by MOB's request.  CSW will monitor CDS results and make report to Child Protective Services if warranted.  MOB was referred for history of depression/anxiety. * Referral screened out by Clinical Social Worker because none of the following criteria appear to apply: ~ History of anxiety/depression during this pregnancy, or of post-partum depression following prior delivery. ~ Diagnosis of anxiety and/or depression within last 3 years OR * MOB's symptoms currently being treated with medication and/or therapy. MOB has as active Rx for Vistaril.  Please contact the Clinical Social Worker if needs arise, by [facility] request, or if MOB scores greater than 9/yes to question 10 on Edinburgh Postpartum Depression Screen.  Blaine Hamper, MSW, LCSW Clinical Social Work [phone]
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
## 2018-08-24 NOTE — Transfer of Care (Signed)
Immediate Anesthesia Transfer of Care Note  Patient: Sabrina Rasmussen  Procedure(s) Performed: CESAREAN SECTION (N/A )  Patient Location: PACU  Anesthesia Type:Spinal  Level of Consciousness: awake  Airway & Oxygen Therapy: Patient Spontanous Breathing  Post-op Assessment: Report given to RN and Post -op Vital signs reviewed and stable  Post vital signs: stable  Last Vitals:  Vitals Value Taken Time  BP 128/83 08/24/2018  5:40 AM  Temp    Pulse 94 08/24/2018  5:43 AM  Resp 21 08/24/2018  5:43 AM  SpO2 100 % 08/24/2018  5:43 AM  Vitals shown include unvalidated device data.  Last Pain:  Vitals:   08/24/18 0247  TempSrc: Oral  PainSc:       Patients Stated Pain Goal: 0 (08/24/18 0233)  Complications: No apparent anesthesia complications

## 2018-08-25 LAB — CBC
HCT: 26.4 % — ABNORMAL LOW (ref 36.0–46.0)
Hemoglobin: 9 g/dL — ABNORMAL LOW (ref 12.0–15.0)
MCH: 32.6 pg (ref 26.0–34.0)
MCHC: 34.1 g/dL (ref 30.0–36.0)
MCV: 95.7 fL (ref 80.0–100.0)
NRBC: 0 % (ref 0.0–0.2)
Platelets: 198 10*3/uL (ref 150–400)
RBC: 2.76 MIL/uL — ABNORMAL LOW (ref 3.87–5.11)
RDW: 14.6 % (ref 11.5–15.5)
WBC: 9.6 10*3/uL (ref 4.0–10.5)

## 2018-08-25 LAB — ABO/RH: ABO/RH(D): O POS

## 2018-08-25 LAB — RPR: RPR Ser Ql: NONREACTIVE

## 2018-08-25 NOTE — Progress Notes (Signed)
POSTPARTUM PROGRESS NOTE  POD #1  Subjective:  Sabrina Rasmussen is a 28 y.o. [id number] s/p rLTCS at [redacted]w[redacted]d.  She reports she doing well. No acute events overnight. She reports she is doing well. She denies any problems with ambulating, voiding or po intake. Denies nausea or vomiting. She has passed flatus. Pain is well controlled.  Lochia is appropriate.  Objective: Blood pressure 127/80, pulse 90, temperature (!) 97.4 F (36.3 C), temperature source Oral, resp. rate 18, height 5\' 3"  (1.6 m), weight 78 kg, last menstrual period 11/22/2017, SpO2 100 %, unknown if currently breastfeeding.  Physical Exam:  General: alert, cooperative and no distress Chest: no respiratory distress Heart:regular rate, distal pulses intact Abdomen: soft, nontender,  Uterine Fundus: firm, appropriately tender DVT Evaluation: No calf swelling or tenderness Extremities: No LE edema Skin: warm, dry; incision clean/dry/intact w/ honeycomb dressing in place  Recent Labs    08/24/18 0320  HGB 11.1*  HCT 32.1*    Assessment/Plan: Sabrina Rasmussen is a 28 y.o. [id number] s/p rLTCS at [redacted]w[redacted]d for SROM.  POD#1 - Doing welll; pain well controlled. H/H pending.  Routine postpartum care  OOB, ambulated  Lovenox for VTE prophylaxis Contraception: Nexplanon  Feeding: Breast  Dispo: Plan for discharge POD#2 or #3. [facility] today.    LOS: 1 day   Marcy Siren, D.O. OB Fellow  08/25/2018, 5:48 AM

## 2018-08-25 NOTE — Progress Notes (Signed)
Initial visit with Claudine, baby boy, and FOB.  Pt shared that she is settling in well since the transfer to [facility] and [facility].  She is attempting to nurse, but the pain breastfeeding is causing during contractions is overwhelming.  FOB had questions about how to order a visitor tray, which I answered for him.  Pt and family are doing well.  Please page as further needs arise.  Maryanna Shape. Carley Hammed, M.Div. [facility] Chaplain Pager [phone] Office [phone]
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
## 2018-08-26 ENCOUNTER — Encounter (HOSPITAL_COMMUNITY): Payer: Self-pay

## 2018-08-26 ENCOUNTER — Encounter (HOSPITAL_COMMUNITY)
Admission: RE | Admit: 2018-08-26 | Discharge: 2018-08-26 | Disposition: A | Discharge status: Home / Self Care | Payer: Medicaid Other | Source: Ambulatory Visit | Attending: Family Medicine | Admitting: Family Medicine

## 2018-08-26 HISTORY — DX: Burn of unspecified body region, unspecified degree: T30.0

## 2018-08-26 LAB — TYPE AND SCREEN
ABO/RH(D): O POS
Antibody Screen: NEGATIVE

## 2018-08-26 MED ORDER — TETANUS-DIPHTH-ACELL PERTUSSIS 5-2.5-18.5 LF-MCG/0.5 IM SUSP: 0.5000 mL | Freq: Once | INTRAMUSCULAR | 0 refills | Status: AC

## 2018-08-26 MED ORDER — SENNOSIDES-DOCUSATE SODIUM 8.6-50 MG PO TABS: 2.0000 | ORAL_TABLET | ORAL | 0 refills | Status: AC

## 2018-08-26 MED ORDER — IBUPROFEN 800 MG PO TABS: 800.0000 mg | ORAL_TABLET | Freq: Four times a day (QID) | ORAL | 0 refills | Status: DC

## 2018-08-26 MED ORDER — OXYCODONE-ACETAMINOPHEN 5-325 MG PO TABS: 1.0000 | ORAL_TABLET | ORAL | 0 refills | Status: DC | PRN

## 2018-08-26 NOTE — Discharge Instructions (Signed)

## 2018-08-26 NOTE — Discharge Summary (Addendum)
Postpartum Discharge Summary     Patient Name: Sabrina Rasmussen DOB: 1991-05-14 MRN: [id number]  Date of admission: 08/24/2018 Delivering Provider: Lazaro Arms   Date of discharge: 08/26/2018  Admitting diagnosis: 38 wk water broke and ctx and back pain Intrauterine pregnancy: [redacted]w[redacted]d     Secondary diagnosis:  Active Problems:   Previous cesarean delivery affecting pregnancy   Tobacco use   History of postpartum depression, currently pregnant in third trimester   Anxiety during pregnancy in third trimester, antepartum   Status post cesarean delivery   Status post repeat low transverse cesarean section  Additional problems: None     Discharge diagnosis: Term Pregnancy Delivered                                                                                                Post partum procedures:None  Augmentation: None  Complications: None  Hospital course:  Onset of Labor With Unplanned C/S  28 y.o. yo [id number] at [redacted]w[redacted]d was admitted in Latent Labor on 08/24/2018. Patient had a labor course significant for SROM at Time/Date: 2:22 AM ,08/24/2018   The patient went for cesarean section due to Elective Repeat, and delivered a Viable infant,08/24/2018. Details of operation can be found in separate operative note. Patient had an uncomplicated postpartum course.  She is ambulating,tolerating a regular diet, passing flatus, and urinating well.  Patient is discharged home in stable condition 08/26/18. Mental health addressed and currently denying SI/HI.  Magnesium Sulfate recieved: No BMZ received: No  Physical exam  Vitals:   08/25/18 1038 08/25/18 1320 08/25/18 2100 08/26/18 0617  BP: (!) 125/93 126/88 125/83 109/76  Pulse: 79 87 89 96  Resp: 18  16 18   Temp: 98.2 F (36.8 C) (!) 97.5 F (36.4 C) 98.3 F (36.8 C) 97.7 F (36.5 C)  TempSrc: Oral Oral Oral   SpO2: 100%  100% 100%  Weight:      Height:       General: alert, cooperative and no distress Lochia:  appropriate Uterine Fundus: firm Incision: Healing well with no significant drainage DVT Evaluation: No evidence of DVT seen on physical exam. Labs: Lab Results  Component Value Date   WBC 9.6 08/25/2018   HGB 9.0 (L) 08/25/2018   HCT 26.4 (L) 08/25/2018   MCV 95.7 08/25/2018   PLT 198 08/25/2018   CMP Latest Ref Rng & Units 04/12/2018  Glucose 70 - 99 mg/dL 85  BUN 6 - 20 mg/dL 8  Creatinine 8.37 - 7.93 mg/dL 9.68(G)  Sodium 648 - 472 mmol/L 134(L)  Potassium 3.5 - 5.1 mmol/L 3.8  Chloride 98 - 111 mmol/L 106  CO2 22 - 32 mmol/L 20(L)  Calcium 8.9 - 10.3 mg/dL 9.0  Total Protein 6.5 - 8.1 g/dL 6.2(L)  Total Bilirubin 0.3 - 1.2 mg/dL 0.6  Alkaline Phos 38 - 126 U/L 52  AST 15 - 41 U/L 18  ALT 0 - 44 U/L 16    Discharge instruction: per After Visit Summary and "Baby and Me Booklet".  After visit meds:  Allergies as of 08/26/2018  Reactions   Buspirone Other (See Comments)   Causes nightmares      Medication List    STOP taking these medications   acetaminophen 325 MG tablet Commonly known as:  TYLENOL   bisacodyl 10 MG suppository Commonly known as:  DULCOLAX   butalbital-acetaminophen-caffeine 50-325-40 MG tablet Commonly known as:  FIORICET, ESGIC   COMFORT FIT MATERNITY SUPP MED Misc   hydrOXYzine 25 MG tablet Commonly known as:  ATARAX/VISTARIL   nicotine 21 mg/24hr patch Commonly known as:  NICODERM CQ - dosed in mg/24 hours   promethazine 12.5 MG tablet Commonly known as:  PHENERGAN     TAKE these medications   CONCEPT OB 130-92.4-1 MG Caps Take 1 capsule by mouth daily.   cyclobenzaprine 10 MG tablet Commonly known as:  FLEXERIL Take 1 tablet (10 mg total) by mouth 3 (three) times daily as needed for muscle spasms.   FLUoxetine 40 MG capsule Commonly known as:  PROZAC Take 1 capsule (40 mg total) by mouth daily.   ibuprofen 800 MG tablet Commonly known as:  ADVIL,MOTRIN Take 1 tablet (800 mg total) by mouth every 6 (six) hours.    oxyCODONE-acetaminophen 5-325 MG tablet Commonly known as:  PERCOCET Take 1 tablet by mouth every 4 (four) hours as needed for severe pain.   senna-docusate 8.6-50 MG tablet Commonly known as:  Senokot-S Take 2 tablets by mouth daily. Start taking on:  August 27, 2018   Tdap 5-2.5-18.5 LF-MCG/0.5 injection Commonly known as:  BOOSTRIX Inject 0.5 mLs into the muscle once for 1 dose.       Diet: routine diet  Activity: Advance as tolerated. Pelvic rest for 6 weeks.   Outpatient follow up:2 weeks for incision check, 4-6 weeks for routine post-partum visit Follow up Appt:No future appointments. Follow up Visit: Follow-up Information    [facility]. Schedule an appointment as soon as possible for a visit in 2 week(s).   Specialty:  Obstetrics and Gynecology Why:  For incision check Please also make a 4 week postpartum appointment Contact information: [address] [place] [address] [phone]           Please schedule this patient for Postpartum visit in: 4 weeks with the following provider: Any provider For C/S patients schedule nurse incision check in weeks 2 weeks: yes Low risk pregnancy complicated by: None Delivery mode:  CS Anticipated Birth Control:  Nexplanon PP Procedures needed: Nexplanon insertion   Schedule Integrated BH visit: yes - H/o severe anxiety/depression, currently on Fluoxitine that is not controlling symptoms  Newborn Data: Live born female  Birth Weight: 6 lb 0.5 oz (2735 g) APGAR: 8, 9  Newborn Delivery   Birth date/time:  08/24/2018 04:59:00 Delivery type:  C-Section, Low Transverse Trial of labor:  No C-section categorization:  Repeat     Baby Feeding: Breast Disposition:home with mother   08/26/2018 Joana Reamer, DO  OB FELLOW DISCHARGE ATTESTATION  I have seen and examined this patient and agree with above documentation in the resident's note.   Marcy Siren, D.O. OB Fellow  08/26/2018, 10:36 AM

## 2018-08-26 NOTE — Lactation Note (Signed)
This note was copied from a baby's chart. Lactation Consultation Note  Patient Name: Boy Kamyiah Mershon [id number] Date: 2/24/220  Late entry from 2/23  [facility] visited mom in her room / she was sound asleep/ check in with dad and he updated  I/O's. 11-7 baby received bottles.  Mom / baby will be transferred to [facility] this am and LC mentioned to dad will seen later by a LC at [facility].       Maternal Data    Feeding    LATCH Score                   Interventions    Lactation Tools Discussed/Used     Consult Status      Matilde Sprang Freda Jaquith 08/26/2018, 2:00 PM

## 2018-08-27 ENCOUNTER — Inpatient Hospital Stay (HOSPITAL_COMMUNITY): Admission: RE | Admit: 2018-08-27 | Payer: Medicaid Other | Source: Home / Self Care | Admitting: Family Medicine

## 2018-09-03 ENCOUNTER — Ambulatory Visit: Payer: Medicaid Other | Admitting: Obstetrics & Gynecology

## 2018-09-04 ENCOUNTER — Encounter: Payer: Self-pay | Admitting: Obstetrics & Gynecology

## 2018-09-04 ENCOUNTER — Telehealth: Payer: Self-pay | Admitting: Obstetrics & Gynecology

## 2018-09-04 NOTE — Telephone Encounter (Signed)
Attempted to call to reschedule postpartum and incision check appointment. Phone rang then hung up.  Mailed letter to call office to reschedule appointment.

## 2018-09-09 ENCOUNTER — Ambulatory Visit (INDEPENDENT_AMBULATORY_CARE_PROVIDER_SITE_OTHER): Payer: Medicaid Other | Admitting: Obstetrics and Gynecology

## 2018-09-09 ENCOUNTER — Encounter: Payer: Self-pay | Admitting: Obstetrics and Gynecology

## 2018-09-09 VITALS — BP 125/86 | HR 93 | Wt 155.2 lb

## 2018-09-09 DIAGNOSIS — G44209 Tension-type headache, unspecified, not intractable: Secondary | ICD-10-CM

## 2018-09-09 DIAGNOSIS — Z98891 History of uterine scar from previous surgery: Secondary | ICD-10-CM

## 2018-09-09 MED ORDER — IBUPROFEN 800 MG PO TABS: 800.0000 mg | ORAL_TABLET | Freq: Three times a day (TID) | ORAL | 1 refills | Status: DC

## 2018-09-09 MED ORDER — BUTALBITAL-APAP-CAFFEINE 50-325-40 MG PO CAPS: 1.0000 | ORAL_CAPSULE | Freq: Four times a day (QID) | ORAL | 0 refills | Status: AC | PRN

## 2018-09-09 NOTE — Progress Notes (Signed)
Sabrina Rasmussen is here for incision check. She had RLTCS on 08/24/18. Post op course was unremarkable. Her only compliant today is a frontal headache for the last week. Motrin helps a little She denies any visual changes or Epigastric pain.  PE AF VSS Lungs clear Heart RRR Abd soft + Bs incision CDI and healing well  A/P Post op incision check        Tension Ha Do not think PEC d/t normal BP but will check labs and urine. Refill Motrin and Fioricet for HA. Return for routine PP visit.

## 2018-09-09 NOTE — Progress Notes (Signed)
Pt is in the office for pp incision check, c section on 08-24-18. Pt denies any abnormal symptoms with incision. PP score=6.

## 2018-09-10 LAB — COMPREHENSIVE METABOLIC PANEL
ALT: 7 IU/L (ref 0–32)
AST: 14 IU/L (ref 0–40)
Albumin/Globulin Ratio: 1.5 (ref 1.2–2.2)
Albumin: 4.1 g/dL (ref 3.9–5.0)
Alkaline Phosphatase: 93 IU/L (ref 39–117)
BUN/Creatinine Ratio: 22 (ref 9–23)
BUN: 14 mg/dL (ref 6–20)
Bilirubin Total: 0.4 mg/dL (ref 0.0–1.2)
CO2: 22 mmol/L (ref 20–29)
Calcium: 9.3 mg/dL (ref 8.7–10.2)
Chloride: 102 mmol/L (ref 96–106)
Creatinine, Ser: 0.64 mg/dL (ref 0.57–1.00)
GFR calc Af Amer: 139 mL/min/{1.73_m2} (ref >59)
GFR calc non Af Amer: 121 mL/min/{1.73_m2} (ref >59)
Globulin, Total: 2.7 g/dL (ref 1.5–4.5)
Glucose: 88 mg/dL (ref 65–99)
Potassium: 4.2 mmol/L (ref 3.5–5.2)
Sodium: 139 mmol/L (ref 134–144)
TOTAL PROTEIN: 6.8 g/dL (ref 6.0–8.5)

## 2018-09-10 LAB — CBC
Hematocrit: 36 % (ref 34.0–46.6)
Hemoglobin: 11.9 g/dL (ref 11.1–15.9)
MCH: 32.3 pg (ref 26.6–33.0)
MCHC: 33.1 g/dL (ref 31.5–35.7)
MCV: 98 fL — ABNORMAL HIGH (ref 79–97)
Platelets: 427 10*3/uL (ref 150–450)
RBC: 3.68 x10E6/uL — AB (ref 3.77–5.28)
RDW: 13.7 % (ref 11.7–15.4)
WBC: 3.4 10*3/uL (ref 3.4–10.8)

## 2018-09-10 LAB — PROTEIN / CREATININE RATIO, URINE
Creatinine, Urine: 116.3 mg/dL
Protein, Ur: 10.9 mg/dL
Protein/Creat Ratio: 94 mg/g creat (ref 0–200)

## 2018-09-24 ENCOUNTER — Ambulatory Visit (INDEPENDENT_AMBULATORY_CARE_PROVIDER_SITE_OTHER): Payer: Medicaid Other | Admitting: Obstetrics and Gynecology

## 2018-09-24 ENCOUNTER — Other Ambulatory Visit: Payer: Self-pay

## 2018-09-24 ENCOUNTER — Encounter: Payer: Self-pay | Admitting: Obstetrics and Gynecology

## 2018-09-24 DIAGNOSIS — Z3202 Encounter for pregnancy test, result negative: Secondary | ICD-10-CM

## 2018-09-24 DIAGNOSIS — Z3042 Encounter for surveillance of injectable contraceptive: Secondary | ICD-10-CM | POA: Diagnosis not present

## 2018-09-24 LAB — POCT URINE PREGNANCY: Preg Test, Ur: NEGATIVE

## 2018-09-24 MED ORDER — MEDROXYPROGESTERONE ACETATE 150 MG/ML IM SUSP
150.0000 mg | INTRAMUSCULAR | Status: AC
  Administered 2018-09-24 – 2019-03-24 (×2): 150 mg via INTRAMUSCULAR

## 2018-09-24 MED ORDER — MEDROXYPROGESTERONE ACETATE 150 MG/ML IM SUSP: 150.0000 mg | INTRAMUSCULAR | 5 refills | Status: AC

## 2018-09-24 NOTE — Progress Notes (Signed)
Administered depo in LUOQ and pt tolerated well .Marland Kitchen Administrations This Visit    medroxyPROGESTERone (DEPO-PROVERA) injection 150 mg    Admin Date 09/24/2018 Action Given Dose 150 mg Route Intramuscular Administered By Katrina Stack, RN

## 2018-09-24 NOTE — Addendum Note (Signed)
Addended by: Natale Milch D on: 09/24/2018 04:04 PM   Modules accepted: Orders

## 2018-09-24 NOTE — Progress Notes (Signed)
Pt is interested in Depo for [facility]  .Marland KitchenPost Partum Exam  Sabrina Rasmussen is a 28 y.o. [id number] female who presents for a postpartum visit. She is 4 weeks postpartum following a low cervical transverse Cesarean section. I have fully reviewed the prenatal and intrapartum course. The delivery was at 38.4 gestational weeks.  Anesthesia: spinal. Postpartum course has been good. Baby's course has been good. Baby is feeding by both breast and bottle - Gerber. Bleeding no bleeding. Bowel function is normal. Bladder function is normal. Patient is not sexually active. Contraception method is Depo-Provera injections. Postpartum depression screening:neg    Last pap smear done 01/2018 and was Normal  Review of Systems Pertinent items are noted in HPI.    Objective:  Blood pressure 130/73, pulse 73, weight 156 lb 3.2 oz (70.9 kg).  General:  alert, cooperative and no distress   Breasts:  inspection negative, no nipple discharge or bleeding, no masses or nodularity palpable  Lungs: clear to auscultation bilaterally  Heart:  regular rate and rhythm, S1, S2 normal, no murmur, click, rub or gallop  Abdomen: soft, non-tender; bowel sounds normal; no masses,  no organomegaly incision: healed well without erythema, induration or drainage   Vulva:  normal  Vagina: normal vagina  Cervix:  multiparous appearance  Corpus: normal size, contour, position, consistency, mobility, non-tender  Adnexa:  normal adnexa  Rectal Exam: Not performed.        Assessment:    Normal postpartum exam. Pap smear not done at today's visit.   Plan:   1. Contraception: Depo-Provera injections 2. Patient is medically cleared to resume all activities 3. Follow up in: 6 months for annual exam or as needed.

## 2018-10-24 ENCOUNTER — Telehealth: Payer: Self-pay | Admitting: *Deleted

## 2018-10-24 ENCOUNTER — Other Ambulatory Visit: Payer: Self-pay | Admitting: Obstetrics and Gynecology

## 2018-10-24 MED ORDER — FLUOXETINE HCL 40 MG PO CAPS: 80.0000 mg | ORAL_CAPSULE | Freq: Every day | ORAL | 5 refills | Status: AC

## 2018-10-24 NOTE — Telephone Encounter (Signed)
Pt called to office with questions bout her medication and PP depression. Return call to pt.  She states that she has been on Prozac during pregnancy and continues to take but doesn't feel that it is helping her.  Pt states she is having problems with both anxiety and depression. Pt states it didn't help her much during pregnancy but she didn't want to keep changing meds. Pt states she has talked with someone in the past to help with her feelings. Pt advised she may want to call for appt.    Pt made msg to go to provider for ?change in Rx and other recommendations. Please advise.

## 2018-10-25 ENCOUNTER — Other Ambulatory Visit: Payer: Self-pay | Admitting: *Deleted

## 2018-10-25 DIAGNOSIS — F329 Major depressive disorder, single episode, unspecified: Secondary | ICD-10-CM

## 2018-10-25 DIAGNOSIS — F419 Anxiety disorder, unspecified: Principal | ICD-10-CM

## 2018-10-25 DIAGNOSIS — F32A Depression, unspecified: Secondary | ICD-10-CM

## 2018-10-25 NOTE — Telephone Encounter (Signed)
Pt made aware of Rx change and would like referral at [facility]. Order has been placed for referral.

## 2018-10-25 NOTE — Progress Notes (Signed)
Referral for Int Behavior health ordered per Dr Jolayne Panther. See phone note.

## 2018-10-30 NOTE — BH Specialist Note (Signed)
First attempt at [facility] visit, pt requested to reschedule, as she was not home, and she had not seen her MyChart message about downloading [employer]. Pt agreed to download Web[employer] app and move appointment to 3:00pm this afternoon.   Second attempt at [facility] visit, pt did not arrive to [facility] visit; pt's voicemail is full, so no message left. Second MyChart message sent.   [facility]al [facility] Initial Visit  MRN: [id number] Name: Shaketa Ervine Vonne Mcdanel, LCSW

## 2018-10-31 ENCOUNTER — Ambulatory Visit: Payer: Self-pay | Admitting: Clinical

## 2018-10-31 ENCOUNTER — Other Ambulatory Visit: Payer: Self-pay

## 2018-11-02 IMAGING — US US OB < 14 WEEKS - US OB TV
1 series · 15 of 28 positions shown · non-contrast
Comparison: None.

CLINICAL DATA: Pelvic pain. Patient is 4 weeks pregnant based on
her last menstrual period.

EXAM:
OBSTETRIC <14 WK US AND TRANSVAGINAL OB US
TECHNIQUE: Both transabdominal and transvaginal ultrasound examinations were
performed for complete evaluation of the gestation as well as the
maternal uterus, adnexal regions, and pelvic cul-de-sac.
Transvaginal technique was performed to assess early pregnancy.

[Series 1: us ob < 14 weeks - us ob tv · 15 of 55 slices shown]
[im 1/55]
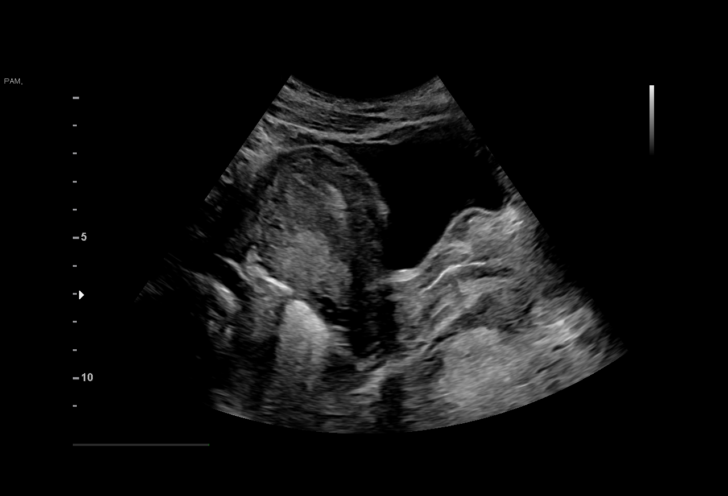
[im 5/55]
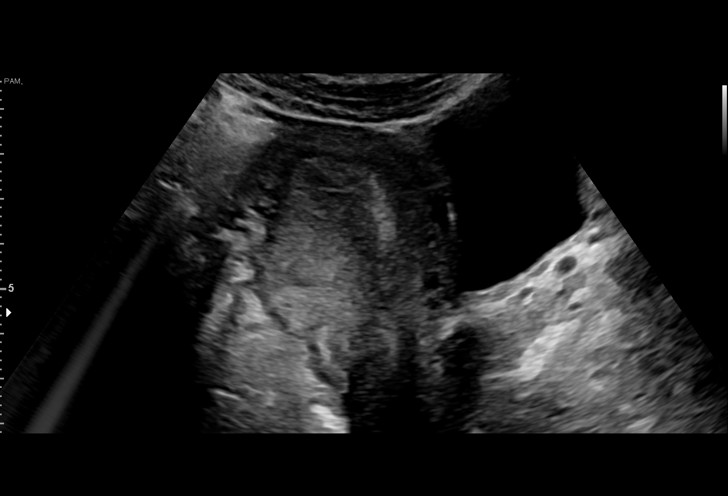
[im 9/55]
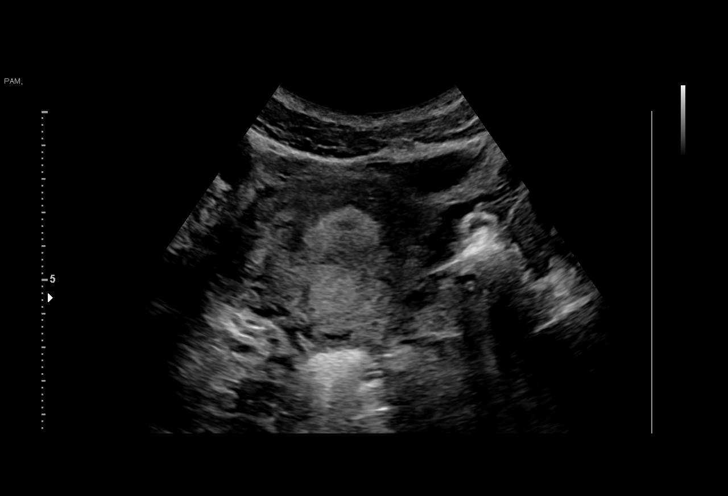
[im 13/55]
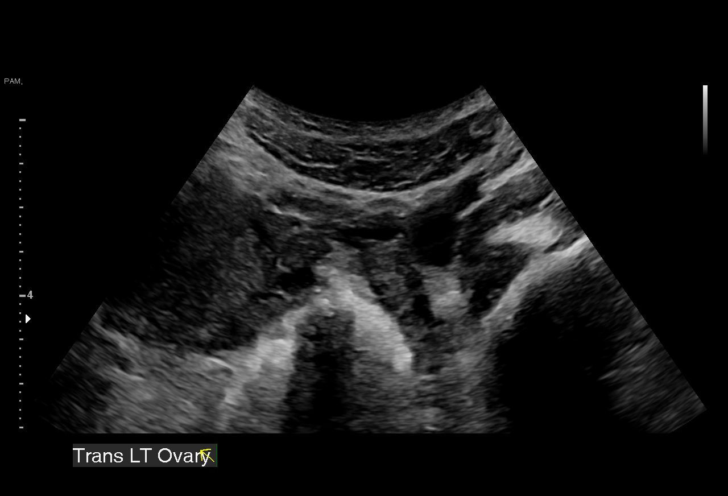
[im 17/55]
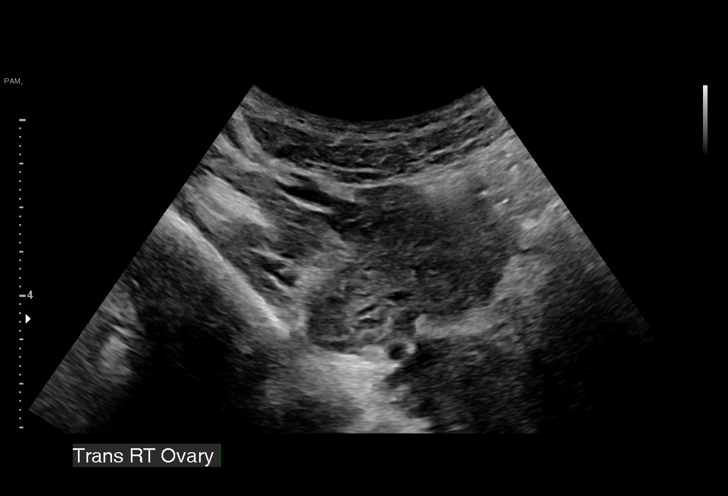
[im 21/55]
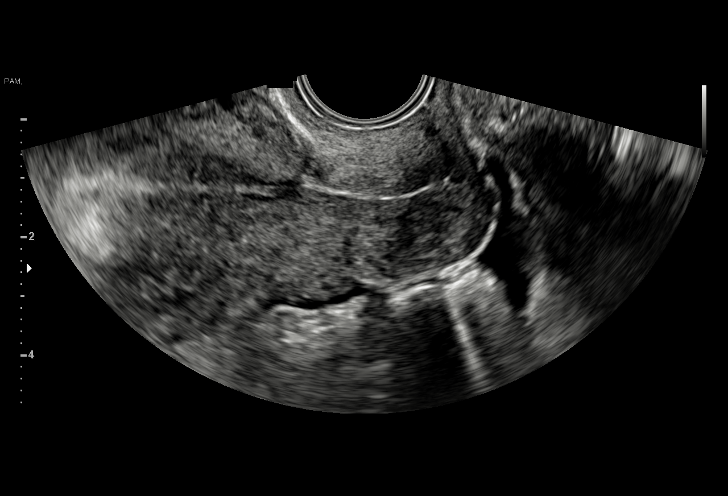
[im 25/55]
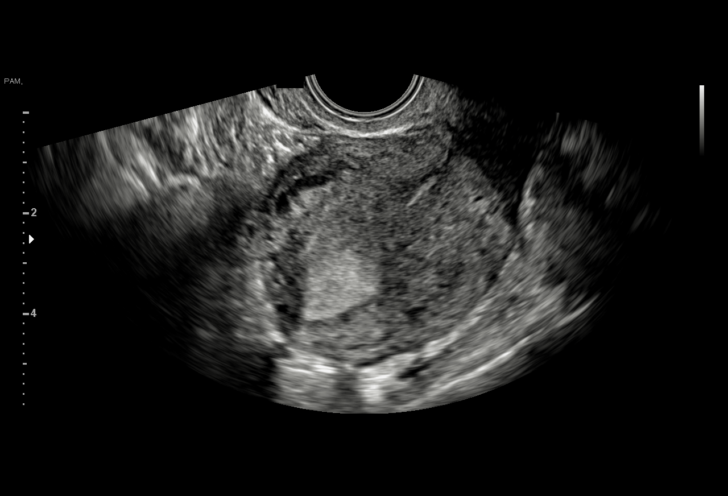
[im 29/55]
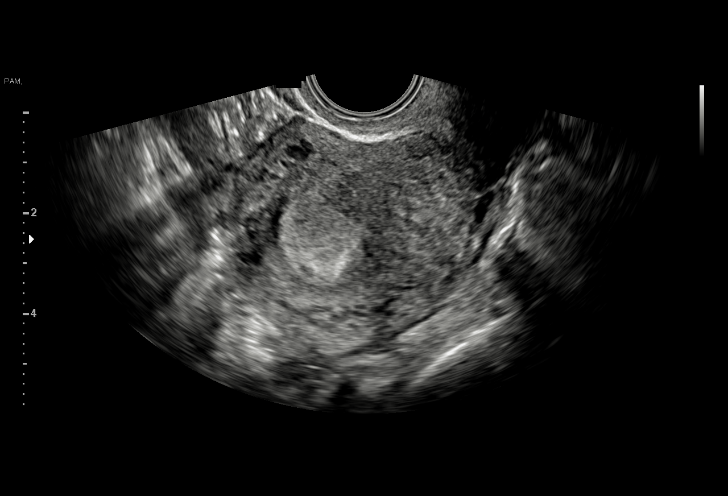
[im 31/55]
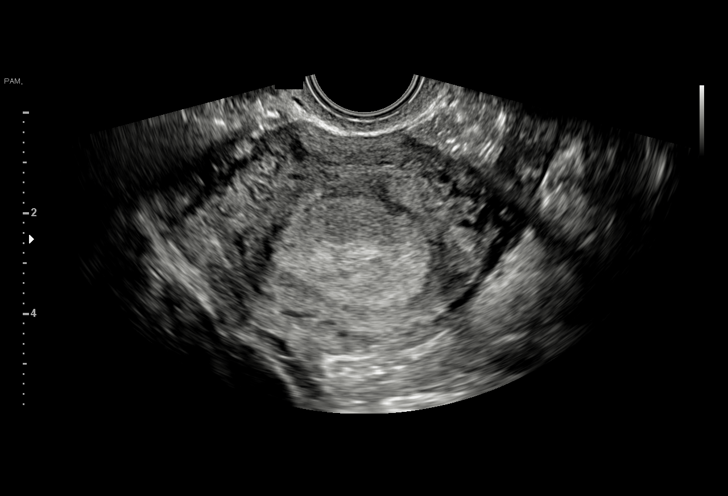
[im 35/55]
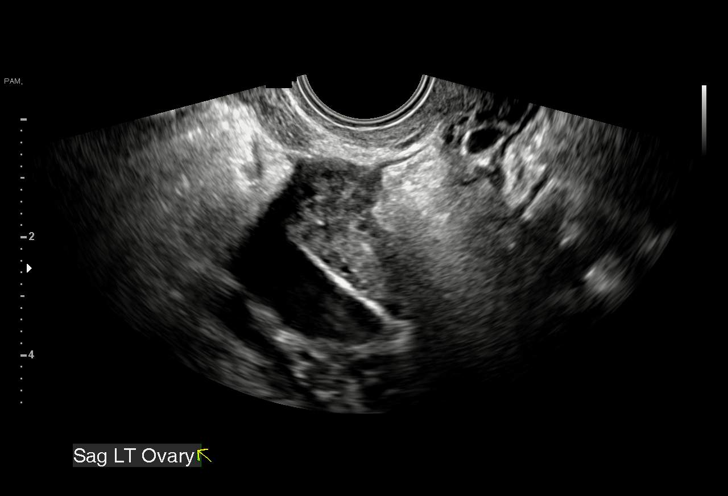
[im 39/55]
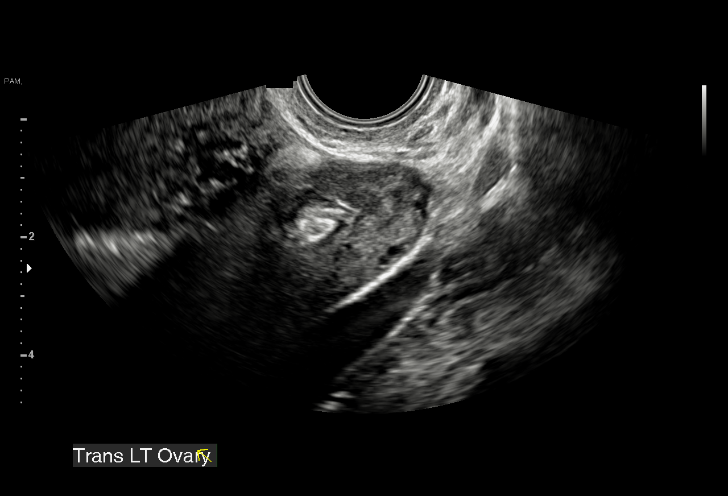
[im 43/55]
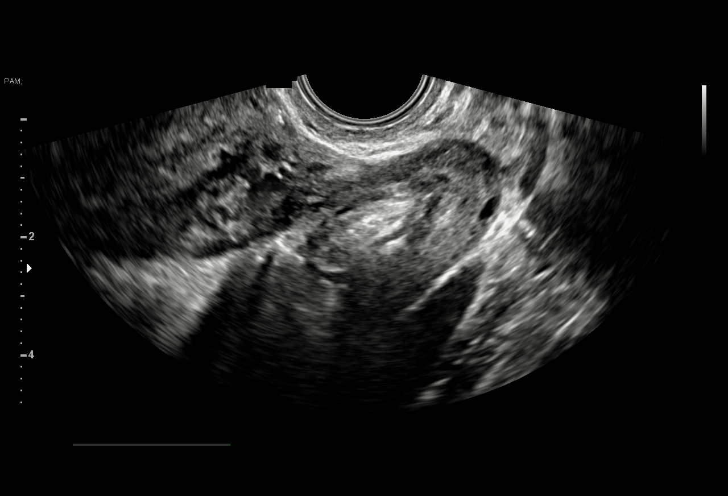
[im 47/55]
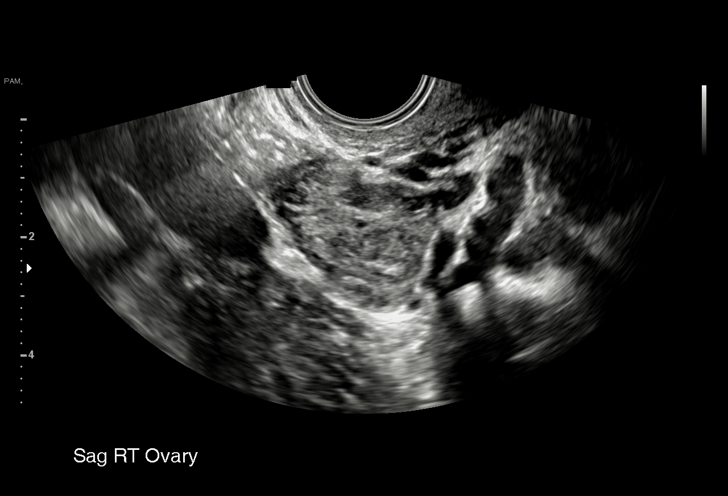
[im 51/55]
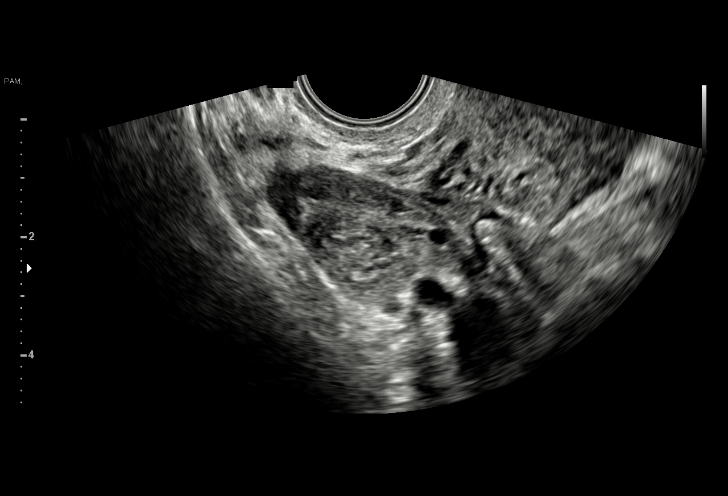
[im 55/55]
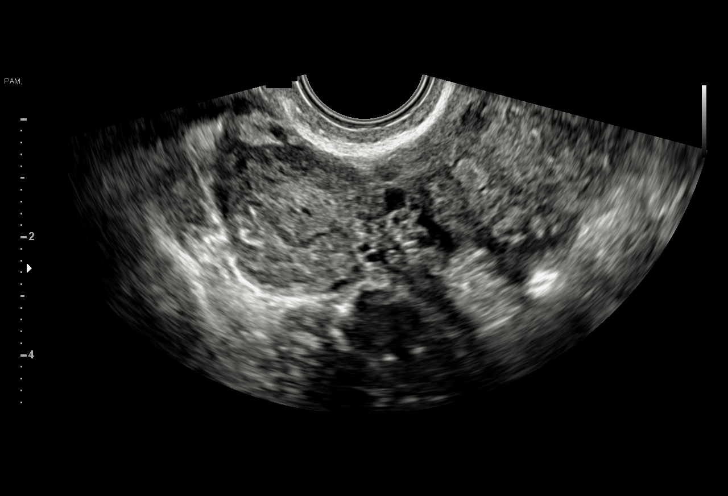

[15 of 28 positions shown; findings below may reference images not displayed]

FINDINGS: Intrauterine gestational sac: None

Yolk sac:  Not Visualized.

Embryo:  Not Visualized.

Maternal uterus/adnexae: No uterine masses. Cervix is closed.
Endometrium is thickened to 19 mm. No ovarian or adnexal
abnormality. Trace physiologic, pelvic free fluid.
IMPRESSION: 1. No intrauterine gestational sac, yolk sac or embryo. The
endometrium is thickened. Findings may reflect an early intrauterine
pregnancy, too early to visualize the gestational sac. Recommend
follow-up ultrasound in 10-14 days and serial beta HCG levels to
document normal pregnancy progression.
2. No acute findings. Normal ovaries and adnexa. No findings to
account for pelvic pain.

## 2018-11-26 ENCOUNTER — Telehealth: Payer: Self-pay | Admitting: *Deleted

## 2018-11-26 NOTE — Telephone Encounter (Signed)
Pt called office stating she feels like she may have a STD, no specific symptoms.  Pt states "just don't feel right". Pt made aware for proper tx, she would need to have testing done.  Pt has been scheduled.

## 2018-12-03 ENCOUNTER — Other Ambulatory Visit: Payer: Self-pay

## 2018-12-03 ENCOUNTER — Ambulatory Visit (INDEPENDENT_AMBULATORY_CARE_PROVIDER_SITE_OTHER): Payer: Medicaid Other

## 2018-12-03 ENCOUNTER — Other Ambulatory Visit (HOSPITAL_COMMUNITY)
Admission: RE | Admit: 2018-12-03 | Discharge: 2018-12-03 | Disposition: A | Discharge status: Home / Self Care | Payer: Medicaid Other | Source: Ambulatory Visit | Attending: Obstetrics and Gynecology | Admitting: Obstetrics and Gynecology

## 2018-12-03 VITALS — BP 117/76 | HR 86 | Ht 63.0 in | Wt 154.0 lb

## 2018-12-03 DIAGNOSIS — N898 Other specified noninflammatory disorders of vagina: Secondary | ICD-10-CM | POA: Diagnosis not present

## 2018-12-03 NOTE — Progress Notes (Signed)
SUBJECTIVE:  28 y.o. female complains of brown vaginal discharge for 7 day(s). Denies abnormal vaginal bleeding or significant pelvic pain or fever. No UTI symptoms. Denies history of known exposure to STD.  No LMP recorded. Patient on DEPO  OBJECTIVE:  She appears well, afebrile. Urine dipstick: not done.  ASSESSMENT:  Vaginal Discharge  Abdominal Pain 3/10 x 1 week   PLAN:  GC, chlamydia, trichomonas, BVAG, CVAG probe sent to lab. Treatment: To be determined once lab results are received ROV prn if symptoms persist or worsen.

## 2018-12-04 LAB — CERVICOVAGINAL ANCILLARY ONLY
Bacterial vaginitis: NEGATIVE
Candida vaginitis: NEGATIVE
Chlamydia: NEGATIVE
Neisseria Gonorrhea: NEGATIVE
Trichomonas: NEGATIVE

## 2018-12-16 ENCOUNTER — Ambulatory Visit: Payer: Medicaid Other

## 2018-12-23 ENCOUNTER — Ambulatory Visit (INDEPENDENT_AMBULATORY_CARE_PROVIDER_SITE_OTHER): Payer: Medicaid Other

## 2018-12-23 ENCOUNTER — Other Ambulatory Visit: Payer: Self-pay

## 2018-12-23 DIAGNOSIS — Z3042 Encounter for surveillance of injectable contraceptive: Secondary | ICD-10-CM

## 2018-12-23 MED ORDER — MEDROXYPROGESTERONE ACETATE 150 MG/ML IM SUSP
150.0000 mg | Freq: Once | INTRAMUSCULAR | Status: AC
  Administered 2018-12-23: 150 mg via INTRAMUSCULAR

## 2018-12-23 NOTE — Progress Notes (Signed)
Pt presents for depo inj. Pt is within her window. Inj given in . Pt tolerated well. Next depo due 9/7-9/21.

## 2018-12-23 NOTE — Progress Notes (Signed)
I have reviewed the chart and agree with nursing staff's documentation of this patient's encounter.  Mora Bellman, MD 12/23/2018 11:06 AM

## 2019-02-17 IMAGING — US US MFM OB COMP +14 WKS
1 series · 13 of 28 positions shown · non-contrast
Comparison: none

[Series 1: us mfm ob comp +14 wks · 13 of 111 slices shown]
[im 5/111]
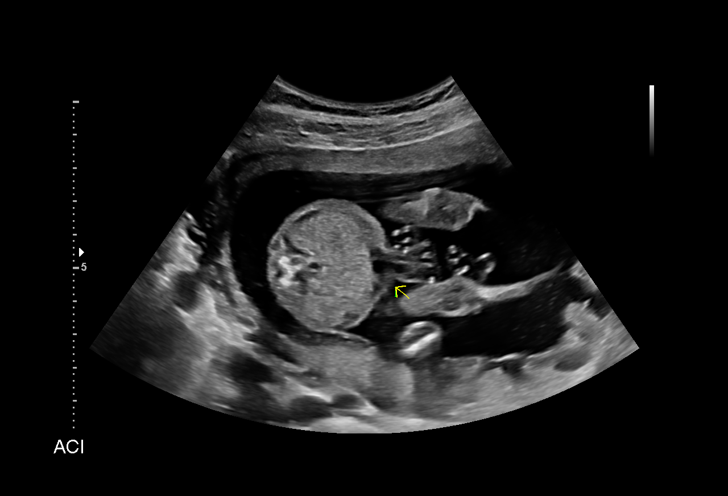
[im 13/111]
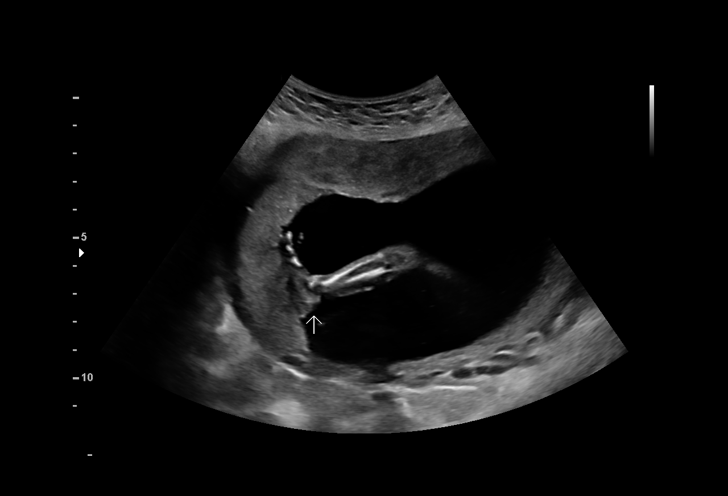
[im 21/111]
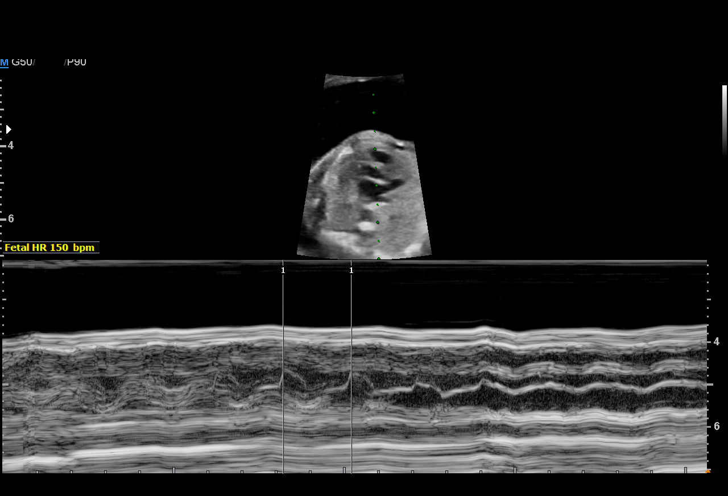
[im 29/111]
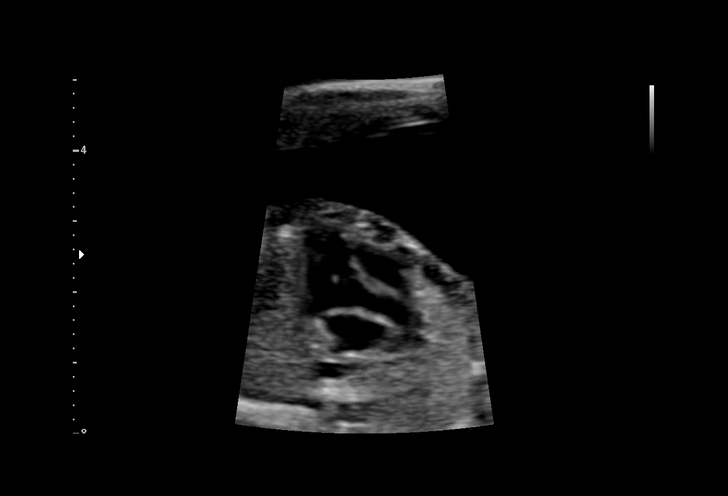
[im 37/111]
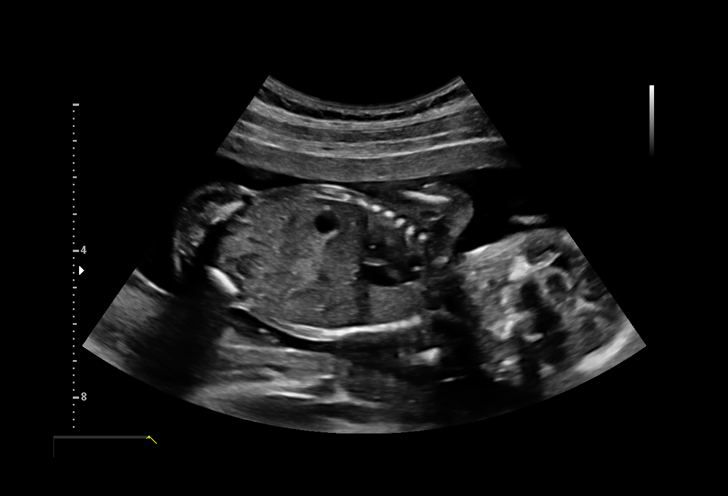
[im 45/111]
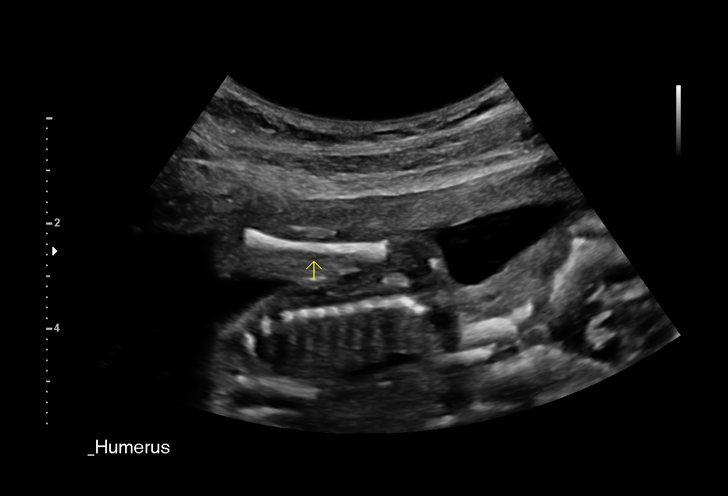
[im 58/111]
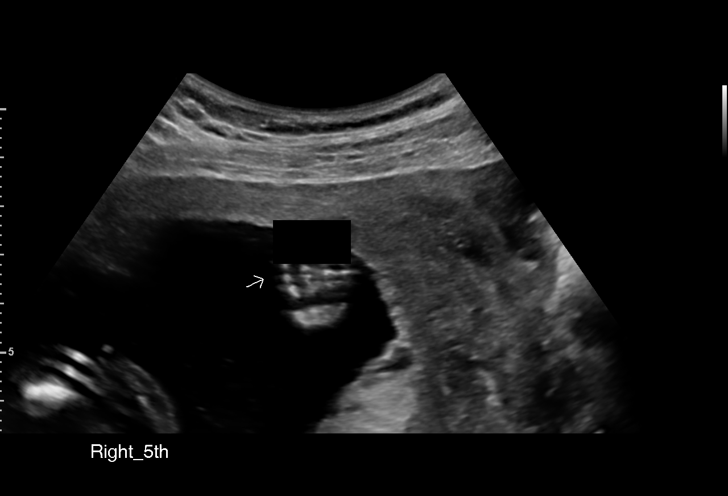
[im 66/111]
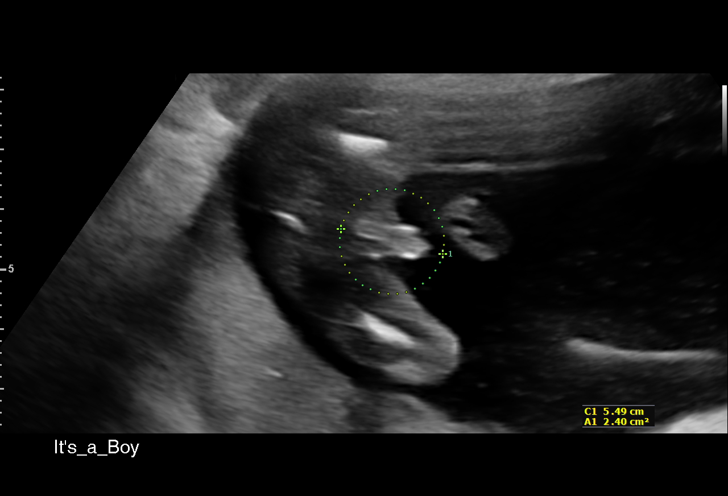
[im 74/111]
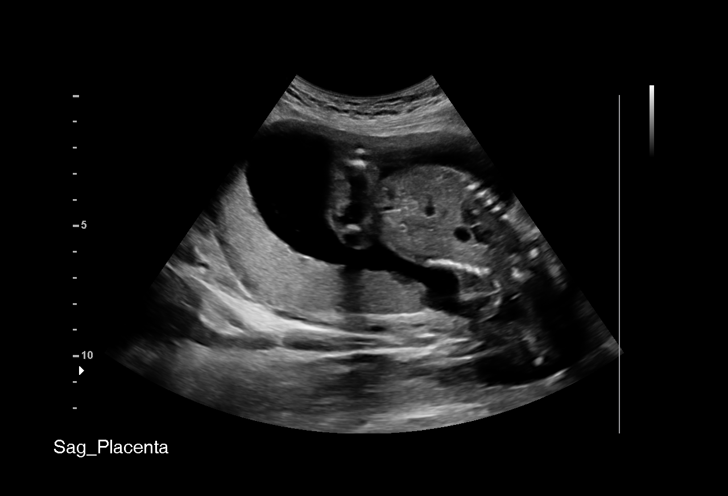
[im 82/111]
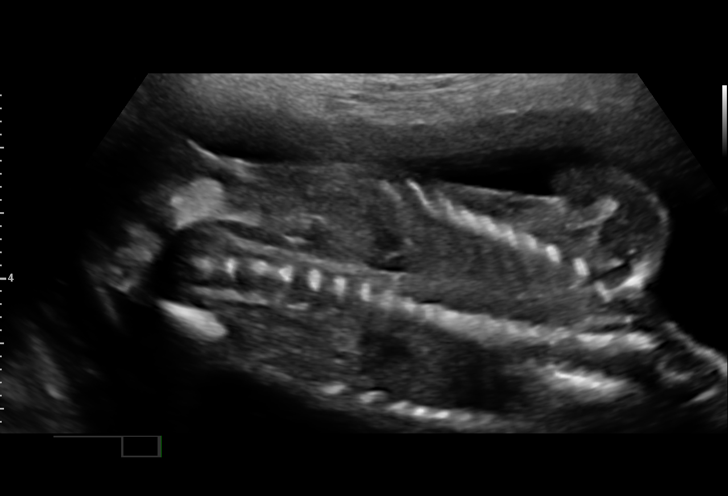
[im 90/111]
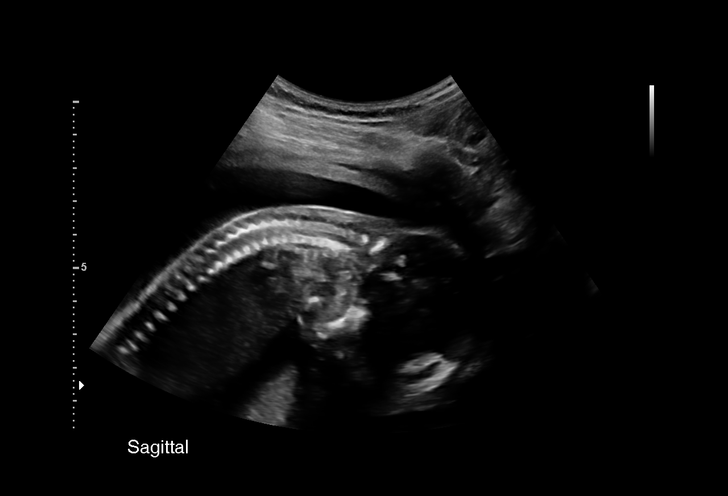
[im 98/111]
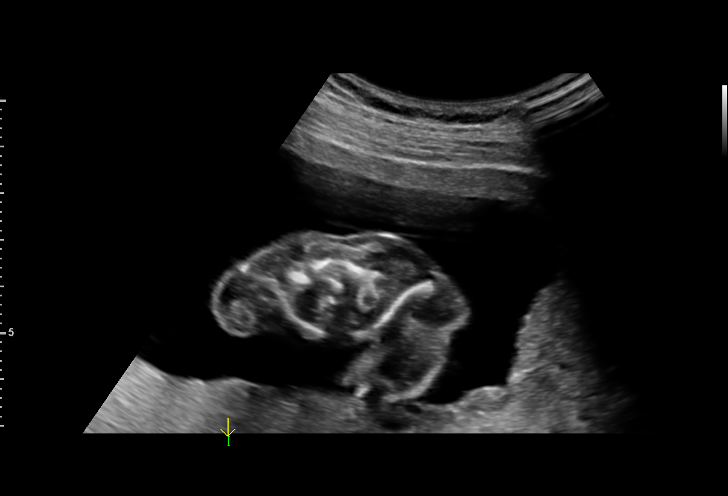
[im 106/111]
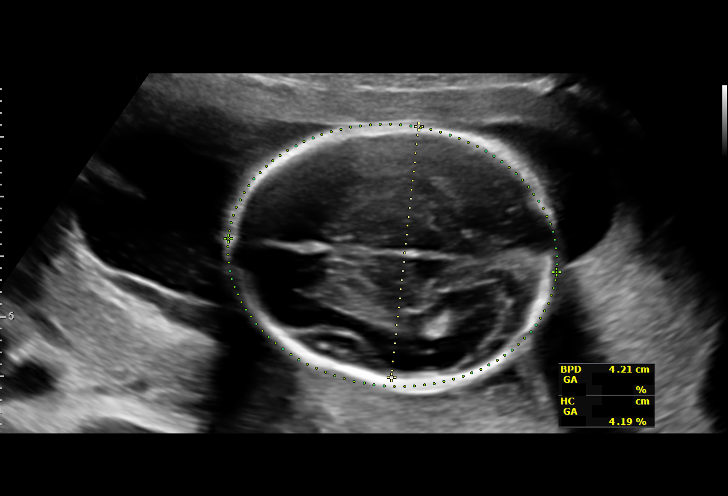

[13 of 28 positions shown; findings below may reference images not displayed]

1  US MFM OB COMP + 14 WK               76805.01     LESYA AUJLA

Indications

Encounter for antenatal screening for
malformations
19 weeks gestation of pregnancy
History of cesarean delivery, currently
pregnant
Fetal Evaluation

Num Of Fetuses:         1
Fetal Heart Rate(bpm):  150
Cardiac Activity:       Observed
Presentation:           Transverse, head to maternal right
Placenta:               Posterior
P. Cord Insertion:      Visualized

Amniotic Fluid
AFI FV:      Within normal limits

Largest Pocket(cm)
5.66
Biometry

BPD:      42.5  mm     G. Age:  18w 6d         45  %    CI:        73.91   %    70 - 86
FL/HC:      17.4   %    16.1 -
HC:       157   mm     G. Age:  18w 6d         24  %    HC/AC:      1.18        1.09 -
AC:      133.6  mm     G. Age:  18w 6d         41  %    FL/BPD:     64.2   %
FL:       27.3  mm     G. Age:  18w 2d         22  %    FL/AC:      20.4   %    20 - 24
HUM:      28.3  mm     G. Age:  19w 1d         53  %
CER:      20.3  mm     G. Age:  19w 2d         57  %
NFT:       3.2  mm
LV:        4.6  mm
CM:        3.9  mm

Est. FW:     249  gm      0 lb 9 oz     37  %
OB History

Gravidity:    4         Term:   1         SAB:   2
Living:       1
Gestational Age

LMP:           19w 5d        Date:  11/22/17                 EDD:   08/29/18
U/S Today:     18w 5d                                        EDD:   09/05/18
Best:          19w 0d     Det. By:  Early Ultrasound         EDD:   09/03/18
(01/16/18)
Anatomy

Cranium:               Appears normal         Aortic Arch:            Appears normal
Cavum:                 Appears normal         Ductal Arch:            Appears normal
Ventricles:            Appears normal         Diaphragm:              Appears normal
Choroid Plexus:        Appears normal         Stomach:                Appears normal, left
sided
Cerebellum:            Appears normal         Abdomen:                Appears normal
Posterior Fossa:       Appears normal         Abdominal Wall:         Appears nml (cord
insert, abd wall)
Nuchal Fold:           Appears normal         Cord Vessels:           Appears normal (3
vessel cord)
Face:                  Appears normal         Kidneys:                Appear normal
(orbits and profile)
Lips:                  Appears normal         Bladder:                Appears normal
Thoracic:              Appears normal         Spine:                  Appears normal
Heart:                 Echogenic focus        Upper Extremities:      Appears normal
in LV
RVOT:                  Appears normal         Lower Extremities:      Appears normal
LVOT:                  Appears normal

Other:  Fetus appears to be a male. Heels and 5th digit visualized. Nasal
bone visualized. Open hands visualized. Technically difficult due to
fetal position.
Cervix Uterus Adnexa

Cervix
Length:           3.91  cm.
Normal appearance by transabdominal scan.

Uterus
No abnormality visualized.

Left Ovary
Size(cm)       3.1  x   1.1    x  1.7       Vol(ml):
Within normal limits.

Right Ovary
Size(cm)       3.9  x   1.4    x  2         Vol(ml):
Within normal limits.
Impression

We performed fetal anatomy scan. An echogenic intracardiac
focus is seen in the left ventricle. No other makers of
aneuploidies or fetal structural defects are seen. Fetal
biometry is consistent with her previously-established dates.
Amniotic fluid is normal and good fetal activity is seen.

On cell-free fetal DNA screening, the risks of fetal
aneuploidies are not increased.
Given that she had Polin Billiot for fetal aneuploidies on cell-free
fetal DNA screening, finding of echogenic intracardiac focus
should be considered a normal variant and that the risk of
trisomy 21 is not increased. Echogenic focus does not
increase the risk of cardiac defects.
Recommendations

Follow-up scans as clinically indicated.

## 2019-02-20 IMAGING — CR DG CHEST 1V
1 series · 1 of 1 positions shown · non-contrast
Comparison: A [DATE]

CLINICAL DATA: 84wks pregnant with nausea and vomiting with last 4
hours, mid chest pain, abdominal discomfort and shortness of breath,
smokes less than [DATE] pk/day, prior 2v chest x-ray 02/13/2017

EXAM:
CHEST  1 VIEW

[chest pa]
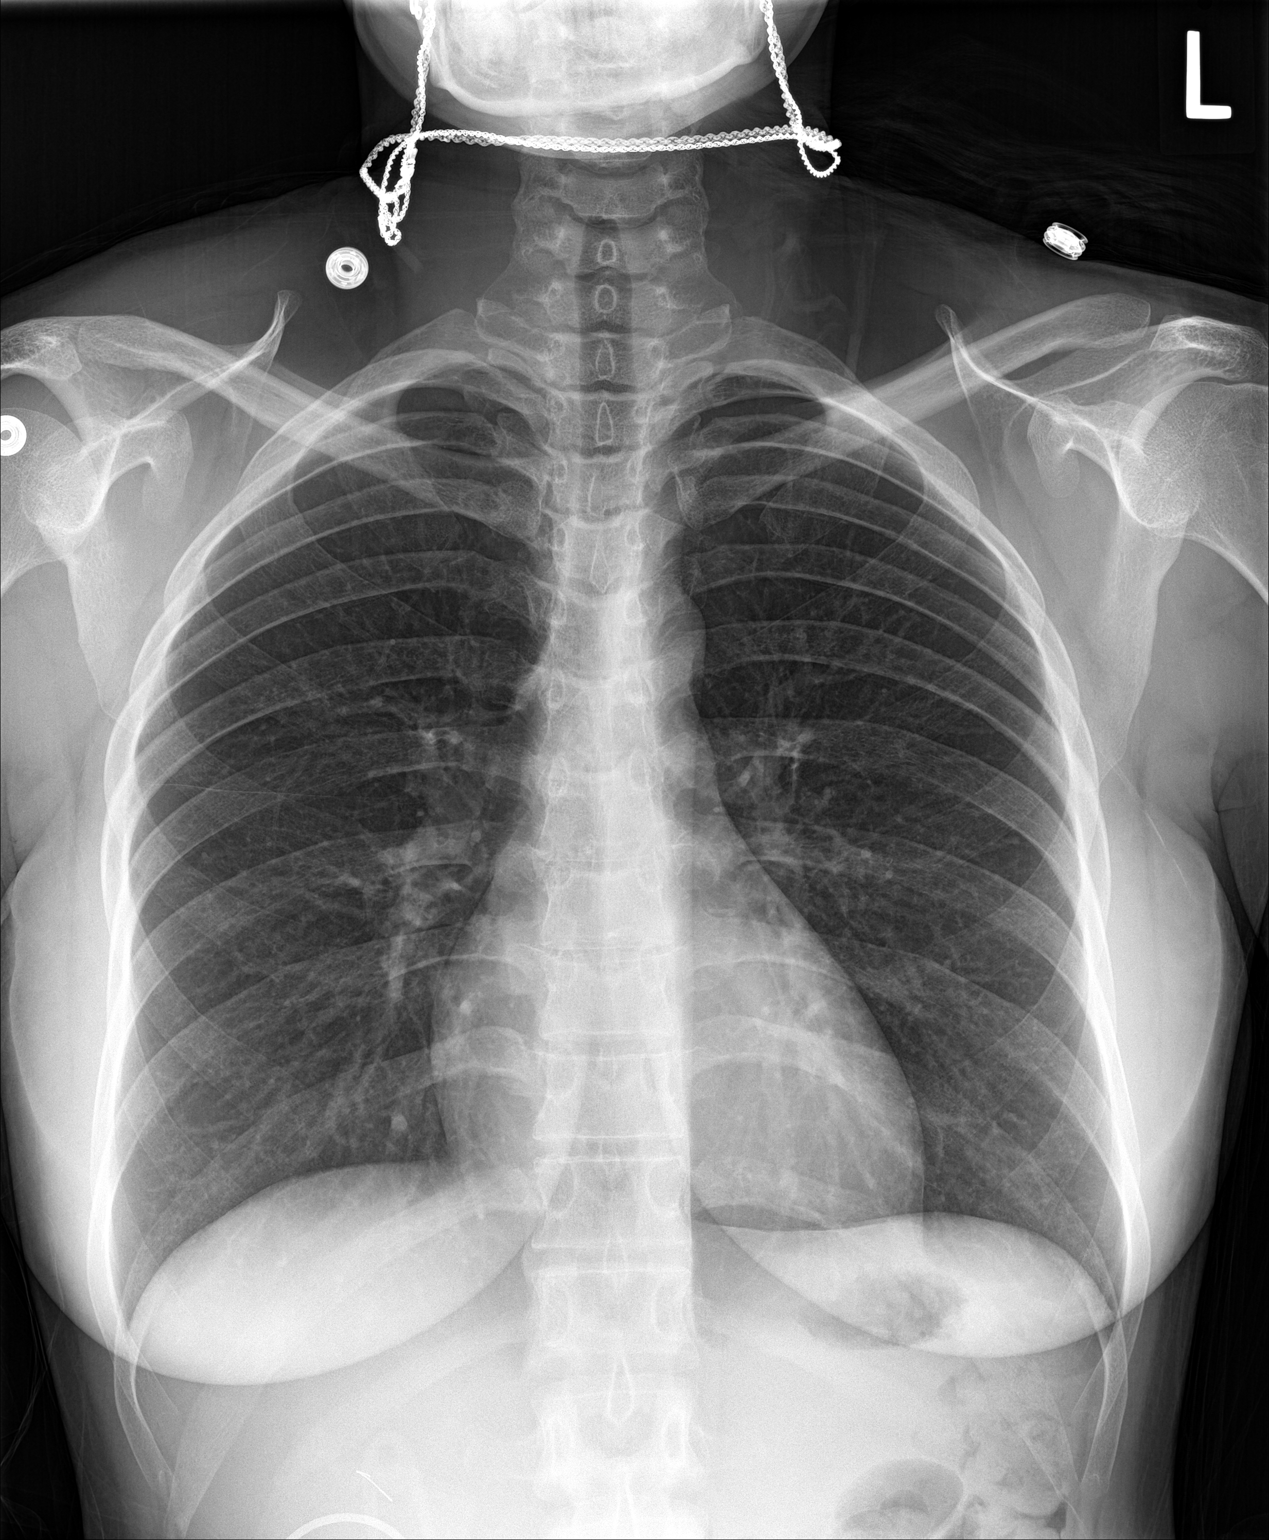

[1 of 1 positions shown; findings below may reference images not displayed]

FINDINGS: The heart size and mediastinal contours are within normal limits.
Both lungs are clear. The visualized skeletal structures are
unremarkable.
IMPRESSION: No active disease.

## 2019-03-17 ENCOUNTER — Ambulatory Visit: Payer: Medicaid Other

## 2019-03-21 ENCOUNTER — Ambulatory Visit: Payer: Medicaid Other

## 2019-03-21 ENCOUNTER — Other Ambulatory Visit: Payer: Self-pay

## 2019-03-24 ENCOUNTER — Other Ambulatory Visit: Payer: Self-pay

## 2019-03-24 ENCOUNTER — Ambulatory Visit (INDEPENDENT_AMBULATORY_CARE_PROVIDER_SITE_OTHER): Payer: Medicaid Other | Admitting: *Deleted

## 2019-03-24 DIAGNOSIS — Z3042 Encounter for surveillance of injectable contraceptive: Secondary | ICD-10-CM

## 2019-03-24 NOTE — Progress Notes (Signed)
Pt is in office for Depo injection. Pt is on time for Depo. Injection given, pt tolerated well. Pt advised to RTO 12/7-12/21.  Pt has no other concerns today.  Administrations This Visit    medroxyPROGESTERone (DEPO-PROVERA) injection 150 mg    Admin Date 03/24/2019 Action Given Dose 150 mg Route Intramuscular Administered By Valene Bors, CMA

## 2019-05-03 IMAGING — DX DG CHEST 2V
2 series · 2 of 2 positions shown · non-contrast
Comparison: 07/21/2016

CLINICAL DATA: Left-sided rib pain and pleuritic chest pain.

EXAM:
CHEST  2 VIEW

[chest pa]
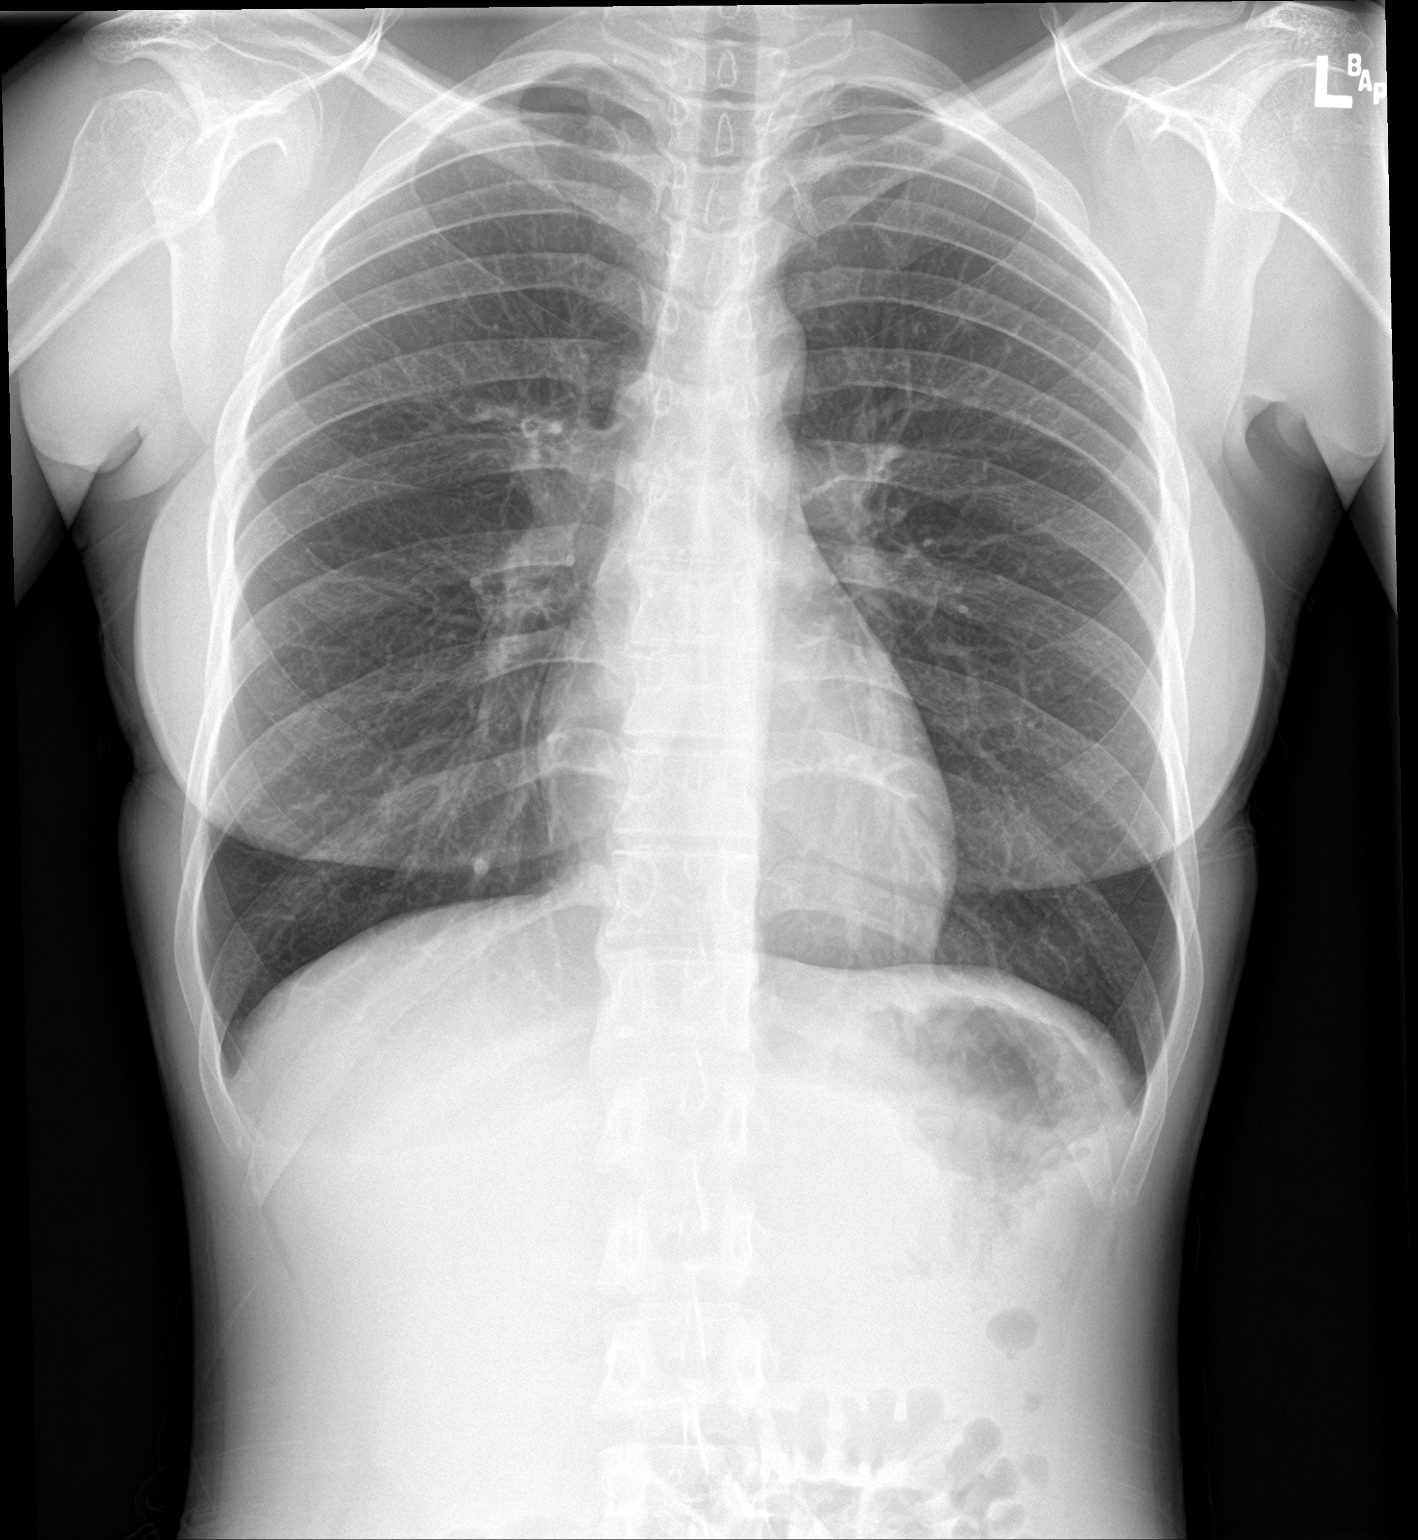

[chest lat]
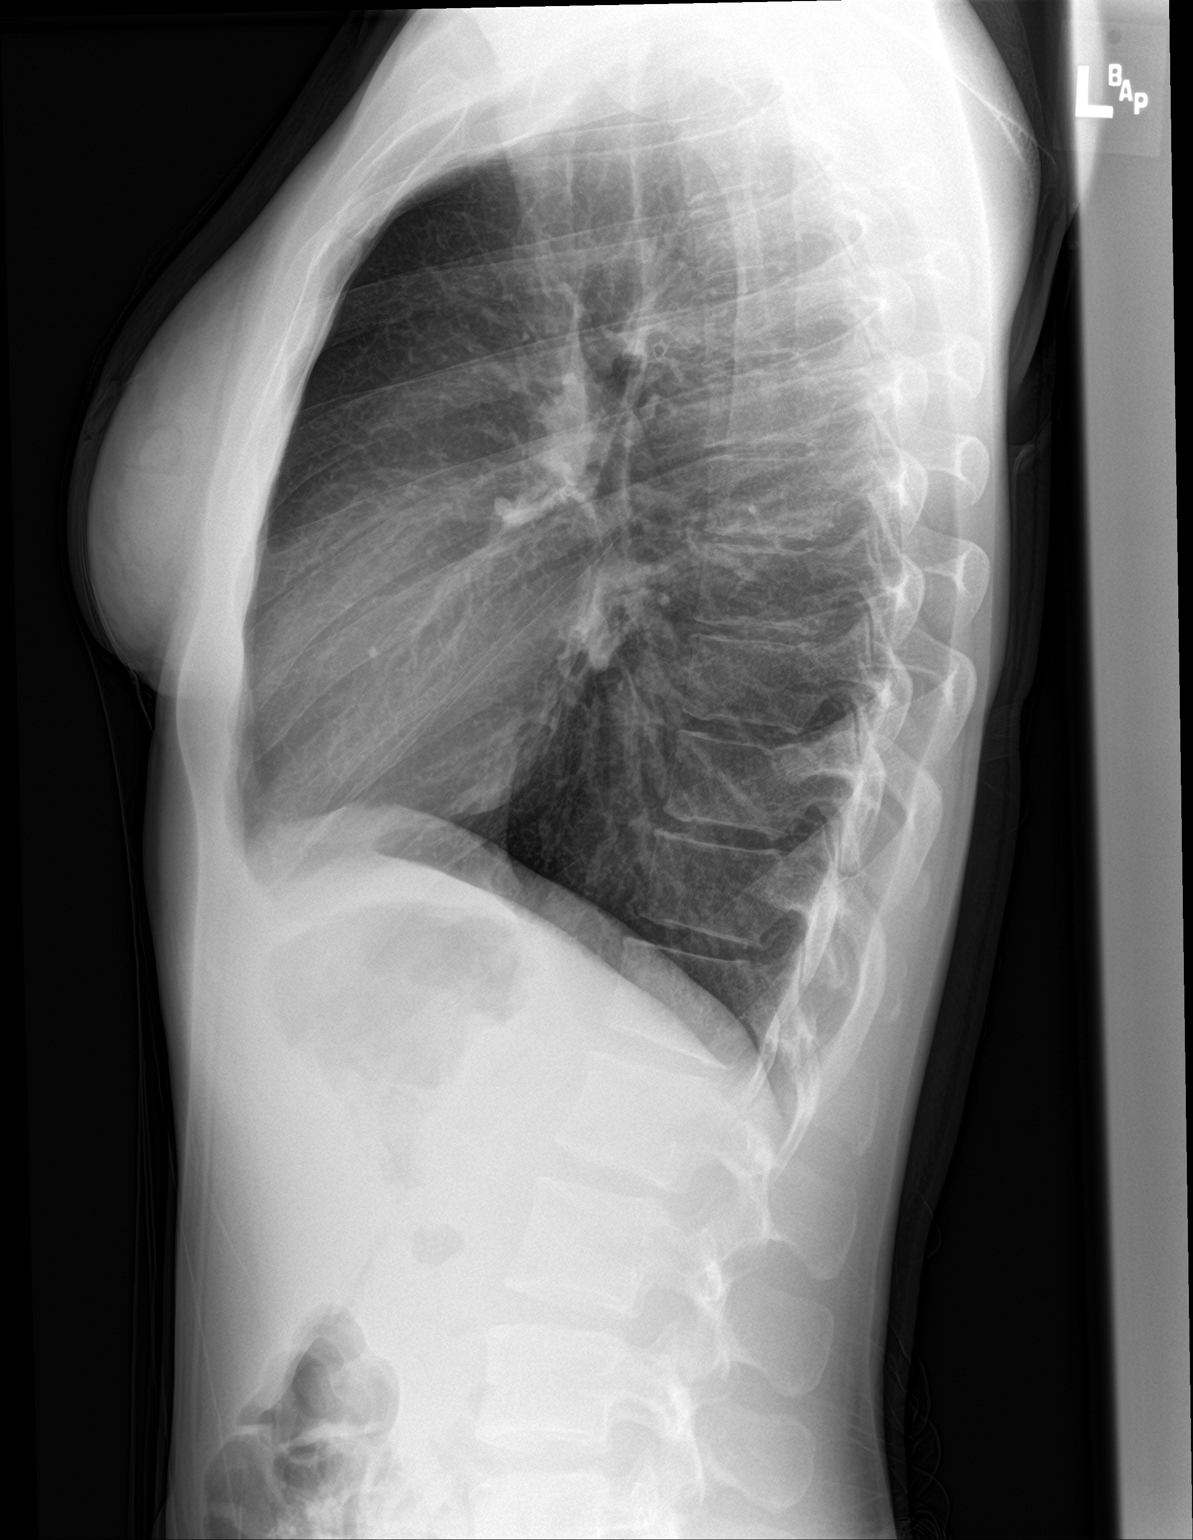

[2 of 2 positions shown; findings below may reference images not displayed]

FINDINGS: The heart size and mediastinal contours are within normal limits.
There is no evidence of pulmonary edema, consolidation,
pneumothorax, nodule or pleural fluid. The visualized skeletal
structures are unremarkable.
IMPRESSION: No active cardiopulmonary disease.

## 2019-06-13 DIAGNOSIS — Z03818 Encounter for observation for suspected exposure to other biological agents ruled out: Secondary | ICD-10-CM | POA: Diagnosis not present

## 2019-06-16 ENCOUNTER — Ambulatory Visit: Payer: Medicaid Other

## 2019-06-18 ENCOUNTER — Ambulatory Visit (INDEPENDENT_AMBULATORY_CARE_PROVIDER_SITE_OTHER): Payer: Medicaid Other

## 2019-06-18 ENCOUNTER — Other Ambulatory Visit: Payer: Self-pay

## 2019-06-18 VITALS — BP 130/75 | HR 82 | Wt 149.0 lb

## 2019-06-18 DIAGNOSIS — Z3042 Encounter for surveillance of injectable contraceptive: Secondary | ICD-10-CM

## 2019-06-18 MED ORDER — MEDROXYPROGESTERONE ACETATE 150 MG/ML IM SUSP
150.0000 mg | Freq: Once | INTRAMUSCULAR | Status: AC
  Administered 2019-06-18: 15:00:00 150 mg via INTRAMUSCULAR

## 2019-06-18 NOTE — Progress Notes (Addendum)
Presents for DEPO, given in [place] , tolerated well.  Next DEPO March 3-17/2021  Administrations This Visit    medroxyPROGESTERone (DEPO-PROVERA) injection 150 mg    Admin Date 06/18/2019 Action Given Dose 150 mg Route Intramuscular Administered By Tamela Oddi, RMA

## 2019-06-20 NOTE — Progress Notes (Signed)
I have reviewed this chart and agree with the RN/CMA assessment and management.    K. Meryl Davis, M.D. Attending [facility] (Faculty Practice)   

## 2019-07-16 ENCOUNTER — Other Ambulatory Visit: Payer: Self-pay

## 2019-07-16 DIAGNOSIS — F32A Depression, unspecified: Secondary | ICD-10-CM

## 2019-07-16 DIAGNOSIS — F329 Major depressive disorder, single episode, unspecified: Secondary | ICD-10-CM

## 2019-07-16 NOTE — Progress Notes (Signed)
Pt called and reports that she is experiencing depression symptoms. Pt reports that she had PPD and was prescribed Prozac, but reports this medication is no longer working. I advised pt that due to her being 11 months post partum she will need to see a therapist or another provider regarding these symptoms. I advised pt that I am referring her to community health and wellness to help her with the depression symptoms, but if she feels like she is having any thoughts of harming self or others that she should not hesitate to call 911 or go to the hospital for immediate treatment. Pt voices understanding and denies any immediate thoughts of harming self or others. Referral sent.

## 2019-09-04 ENCOUNTER — Other Ambulatory Visit: Payer: Self-pay

## 2019-09-04 ENCOUNTER — Ambulatory Visit: Payer: Medicaid Other | Attending: Obstetrics | Admitting: Family Medicine

## 2019-09-09 ENCOUNTER — Ambulatory Visit: Payer: Medicaid Other

## 2019-09-19 ENCOUNTER — Ambulatory Visit: Payer: Medicaid Other

## 2020-04-08 ENCOUNTER — Encounter: Payer: Self-pay | Admitting: Physician Assistant

## 2020-04-08 ENCOUNTER — Other Ambulatory Visit: Payer: Self-pay

## 2020-04-08 ENCOUNTER — Ambulatory Visit (LOCAL_COMMUNITY_HEALTH_CENTER): Payer: Medicaid Other | Admitting: Physician Assistant

## 2020-04-08 VITALS — BP 112/76 | Ht 63.0 in | Wt 141.6 lb

## 2020-04-08 DIAGNOSIS — Z3009 Encounter for other general counseling and advice on contraception: Secondary | ICD-10-CM | POA: Diagnosis not present

## 2020-04-08 DIAGNOSIS — Z720 Tobacco use: Secondary | ICD-10-CM

## 2020-04-08 DIAGNOSIS — Z30013 Encounter for initial prescription of injectable contraceptive: Secondary | ICD-10-CM | POA: Diagnosis not present

## 2020-04-08 LAB — PREGNANCY, URINE: Preg Test, Ur: NEGATIVE

## 2020-04-08 MED ORDER — MEDROXYPROGESTERONE ACETATE 150 MG/ML IM SUSP
150.0000 mg | Freq: Once | INTRAMUSCULAR | Status: AC
  Administered 2020-04-08: 150 mg via INTRAMUSCULAR
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
## 2020-04-08 NOTE — Progress Notes (Signed)
PT negative. Depo given and tolerated well. Tawny Hopping, RN

## 2020-04-08 NOTE — Progress Notes (Addendum)
Patient here today for to restart Depo. No record of PE's or Pap Smears here. Last Pap Smear was 02/20/2018, last PE was a PP exam 09/24/2018 at [facility] in [place], last Depo was received at above provider 06/18/2019. Patient wants only Depo today and is aware she will need a PE here with next Depo appt. All records for above in Epic. Tawny Hopping, RN

## 2020-04-30 ENCOUNTER — Ambulatory Visit (HOSPITAL_COMMUNITY): Payer: Self-pay

## 2020-06-29 ENCOUNTER — Ambulatory Visit: Payer: Medicaid Other

## 2020-08-25 ENCOUNTER — Encounter: Payer: Self-pay | Admitting: Advanced Practice Midwife

## 2020-08-25 ENCOUNTER — Other Ambulatory Visit: Payer: Self-pay

## 2020-08-25 ENCOUNTER — Ambulatory Visit (LOCAL_COMMUNITY_HEALTH_CENTER): Payer: Medicaid Other | Admitting: Family Medicine

## 2020-08-25 VITALS — BP 105/69 | Ht 63.0 in | Wt 135.2 lb

## 2020-08-25 DIAGNOSIS — Z3009 Encounter for other general counseling and advice on contraception: Secondary | ICD-10-CM

## 2020-08-25 DIAGNOSIS — Z3042 Encounter for surveillance of injectable contraceptive: Secondary | ICD-10-CM

## 2020-08-25 MED ORDER — MEDROXYPROGESTERONE ACETATE 150 MG/ML IM SUSP
150.0000 mg | INTRAMUSCULAR | Status: AC
  Administered 2020-08-25: 150 mg via INTRAMUSCULAR

## 2020-08-25 NOTE — Progress Notes (Signed)
   [facility] problem visit  [facility]  Subjective:  Sabrina Rasmussen is a 30 y.o. being seen today due to late depo.    Chief Complaint  Patient presents with  . Contraception    Depo    Patient here for Depo, patient is 19.6 weeks since last depo.  Patient last sex was 07/03/2020, (depo  Was 12.1 weeks)  Patient was covered for birth control at that time.  Patient last pap was 2019, last PE was 04/08/2020.  Patient denies need for STI testing or S/sx today.         Does the patient have a current or past history of drug use? Yes   No components found for: HCV]   Health Maintenance Due  Topic Date Due  . Hepatitis C Screening  Never done  . COVID-19 Vaccine (1) Never done  . TETANUS/TDAP  Never done    Review of Systems  Genitourinary: Negative.     The following portions of the patient's history were reviewed and updated as appropriate: allergies, current medications, past family history, past medical history, past social history, past surgical history and problem list. Problem list updated.   See flowsheet for other program required questions.  Objective:   Vitals:   08/25/20 0903  BP: 105/69  Weight: 135 lb 3.2 oz (61.3 kg)  Height: 5\' 3"  (1.6 m)    Physical Exam Constitutional:      Appearance: Normal appearance.  Pulmonary:     Effort: Pulmonary effort is normal.  Neurological:     Mental Status: She is alert and oriented to person, place, and time.  Psychiatric:        Mood and Affect: Mood normal.        Behavior: Behavior normal.       Assessment and Plan:  Sabrina Rasmussen is a 30 y.o. female presenting to the [facility] Department for a Women's Health problem visit  1. Family planning counseling 2. Encounter for surveillance of injectable contraceptive Discussed keeping depo injections at 11-13 weeks for best protection for birth control.  Discussed methods to help remember, such as setting  reminders in phone or calendar.  Declined need for STI testing today.    Ok for nursing to give depo today.  Will order  Depo for remainder of year.    Return in about 3 months (around 11/22/2020) for depo.  No future appointments.  11/24/2020, FNP

## 2020-08-25 NOTE — Progress Notes (Signed)
Depo given today per M.Garner, FNP  Order. Tawny Hopping, RN

## 2020-08-25 NOTE — Progress Notes (Addendum)
Here today for late Depo. Last PE and Depo here was 04/08/2020 (19.6 weeks.) Last Pap Smear was 02/20/2018 (next due 02/20/2021.) Last sex was 07/03/2020 (12.1 weeks post Depo.) Declines condoms today. Tawny Hopping, RN

## 2021-02-21 ENCOUNTER — Ambulatory Visit: Payer: Medicaid Other

## 2021-06-27 ENCOUNTER — Emergency Department (HOSPITAL_COMMUNITY)
Admission: EM | Admit: 2021-06-27 | Discharge: 2021-06-27 | Disposition: A | Discharge status: Home / Self Care | Payer: Medicaid Other | Attending: Emergency Medicine | Admitting: Emergency Medicine

## 2021-06-27 ENCOUNTER — Encounter (HOSPITAL_COMMUNITY): Payer: Self-pay | Admitting: *Deleted

## 2021-06-27 ENCOUNTER — Other Ambulatory Visit: Payer: Self-pay

## 2021-06-27 ENCOUNTER — Emergency Department (HOSPITAL_COMMUNITY): Payer: Medicaid Other

## 2021-06-27 DIAGNOSIS — F17210 Nicotine dependence, cigarettes, uncomplicated: Secondary | ICD-10-CM | POA: Diagnosis not present

## 2021-06-27 DIAGNOSIS — K029 Dental caries, unspecified: Secondary | ICD-10-CM | POA: Diagnosis not present

## 2021-06-27 DIAGNOSIS — K122 Cellulitis and abscess of mouth: Secondary | ICD-10-CM | POA: Diagnosis not present

## 2021-06-27 DIAGNOSIS — K047 Periapical abscess without sinus: Secondary | ICD-10-CM | POA: Diagnosis not present

## 2021-06-27 MED ORDER — NAPROXEN 250 MG PO TABS
500.0000 mg | ORAL_TABLET | Freq: Once | ORAL | Status: AC
  Administered 2021-06-27: 20:00:00 500 mg via ORAL
  Filled 2021-06-27: qty 2

## 2021-06-27 MED ORDER — IBUPROFEN 600 MG PO TABS: 600.0000 mg | ORAL_TABLET | Freq: Four times a day (QID) | ORAL | 0 refills | Status: AC | PRN

## 2021-06-27 MED ORDER — HYDROCODONE-ACETAMINOPHEN 5-325 MG PO TABS
1.0000 | ORAL_TABLET | Freq: Once | ORAL | Status: AC
  Administered 2021-06-27: 14:00:00 1 via ORAL
  Filled 2021-06-27: qty 1

## 2021-06-27 MED ORDER — PENICILLIN V POTASSIUM 500 MG PO TABS: 500.0000 mg | ORAL_TABLET | Freq: Four times a day (QID) | ORAL | 0 refills | Status: AC

## 2021-06-27 MED ORDER — OXYCODONE-ACETAMINOPHEN 5-325 MG PO TABS
1.0000 | ORAL_TABLET | Freq: Once | ORAL | Status: AC
  Administered 2021-06-27: 20:00:00 1 via ORAL
  Filled 2021-06-27: qty 1

## 2021-06-27 MED ORDER — PENICILLIN V POTASSIUM 250 MG PO TABS
500.0000 mg | ORAL_TABLET | Freq: Once | ORAL | Status: AC
  Administered 2021-06-27: 20:00:00 500 mg via ORAL
  Filled 2021-06-27: qty 2

## 2021-06-27 MED ORDER — ACETAMINOPHEN 500 MG PO TABS: 500.0000 mg | ORAL_TABLET | Freq: Four times a day (QID) | ORAL | 0 refills | Status: AC | PRN

## 2021-06-27 MED ORDER — METRONIDAZOLE 500 MG PO TABS: 500.0000 mg | ORAL_TABLET | Freq: Two times a day (BID) | ORAL | 0 refills | Status: AC

## 2021-06-27 MED ORDER — OXYCODONE-ACETAMINOPHEN 5-325 MG PO TABS: 1.0000 | ORAL_TABLET | Freq: Two times a day (BID) | ORAL | 0 refills | Status: AC | PRN

## 2021-06-27 NOTE — Discharge Instructions (Addendum)
We saw you in the ER for the toothache. You have a dental abscess at the root of your right lower molar tooth. YOU WILL NEED TO SEE A DENTIST to get appropriate care.  Call the number provided tomorrow to set up an appointment. Please take the antibiotics and pain meds provided in the interval.  Return to the ER if there is drooling, difficulty breathing, difficulty opening mouth, fevers, chills, confusion, headaches, seizures.

## 2021-06-27 NOTE — ED Notes (Signed)
DC instruction reviewed with pt. Pt verbalized understanding. PT DC.  °

## 2021-06-27 NOTE — ED Provider Notes (Signed)
Emergency Medicine Provider Triage Evaluation Note  Sabrina Rasmussen , a 30 y.o. female  was evaluated in triage.  Pt complains of right sided dental pain / concern for abscess. Has been ongoing for over a month. Reports was seen by dentist, given amox, originally took 2 days of abx, then d/c, has been taking the rest for the last 5 days. Has been taking ibuprofen 800mg  with minimal relief. No difficulty breathing.  Review of Systems  Positive: Dental pain / abscess Negative: Respiratory distress  Physical Exam  BP 119/83 (BP Location: Right Arm)    Pulse 78    Temp 98.1 F (36.7 C) (Oral)    Resp 18    SpO2 98%  Gen:   Awake, no distress  Resp:  Normal effort  MSK:   Moves extremities without difficulty  Other:  Swollen tender, possible fluctuant mass noted right buccal mucosa. No clear pore or abscess noted on the gums themselves.  Medical Decision Making  Medically screening exam initiated at 2:13 PM.  Appropriate orders placed.  was informed that the remainder of the evaluation will be completed by another provider, this initial triage assessment does not replace that evaluation, and the importance of remaining in the ED until their evaluation is complete.  Workup initiated   Grace Blight, PA-C 06/27/21 1415    06/29/21, DO 06/27/21 1617

## 2021-06-27 NOTE — ED Triage Notes (Signed)
Pt has right side dental abscess x 4 days causing severe pain. Swelling noted to right side of face, reports that swelling is going down into her neck and throat. Airway intact at triage, no resp distress noted. Denies fever.
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
## 2021-06-28 ENCOUNTER — Telehealth: Payer: Self-pay

## 2021-06-28 NOTE — Telephone Encounter (Signed)
Transition Care Management Unsuccessful Follow-up Telephone Call  Date of discharge and from where:  06/27/2021-[facility]  Attempts:  1st Attempt  Reason for unsuccessful TCM follow-up call:  Voice mail full

## 2021-06-29 NOTE — Telephone Encounter (Signed)
Transition Care Management Unsuccessful Follow-up Telephone Call  Date of discharge and from where:  06/27/2021-[facility]  Attempts:  2nd Attempt  Reason for unsuccessful TCM follow-up call:  Voice mail full

## 2021-06-30 NOTE — Telephone Encounter (Signed)
Transition Care Management Unsuccessful Follow-up Telephone Call  Date of discharge and from where:  06/27/2021 from [facility]  Attempts:  3rd Attempt  Reason for unsuccessful TCM follow-up call:  Unable to reach patient

## 2023-03-10 ENCOUNTER — Encounter (HOSPITAL_COMMUNITY): Payer: Self-pay

## 2023-03-10 ENCOUNTER — Ambulatory Visit (HOSPITAL_COMMUNITY)
Admission: EM | Admit: 2023-03-10 | Discharge: 2023-03-10 | Disposition: A | Discharge status: Home / Self Care | Payer: 59 | Attending: Emergency Medicine | Admitting: Emergency Medicine

## 2023-03-10 DIAGNOSIS — F17210 Nicotine dependence, cigarettes, uncomplicated: Secondary | ICD-10-CM | POA: Diagnosis not present

## 2023-03-10 DIAGNOSIS — Z113 Encounter for screening for infections with a predominantly sexual mode of transmission: Secondary | ICD-10-CM

## 2023-03-10 DIAGNOSIS — N898 Other specified noninflammatory disorders of vagina: Secondary | ICD-10-CM | POA: Insufficient documentation

## 2023-03-10 LAB — POCT URINALYSIS DIP (MANUAL ENTRY)
Bilirubin, UA: NEGATIVE
Blood, UA: NEGATIVE
Glucose, UA: NEGATIVE mg/dL
Ketones, POC UA: NEGATIVE mg/dL
Leukocytes, UA: NEGATIVE
Nitrite, UA: NEGATIVE
Protein Ur, POC: NEGATIVE mg/dL
Spec Grav, UA: 1.01 (ref 1.010–1.025)
Urobilinogen, UA: 0.2 U/dL
pH, UA: 7 (ref 5.0–8.0)

## 2023-03-10 LAB — POCT URINE PREGNANCY: Preg Test, Ur: NEGATIVE
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
## 2023-03-10 NOTE — Discharge Instructions (Signed)
We will call you if anything on your swab returns positive. You can also see these results on MyChart. Please abstain from sexual intercourse until your results return. 

## 2023-03-10 NOTE — ED Triage Notes (Signed)
Pt has unprotected sex 3 days ago. She is concerned about STI.

## 2023-03-12 LAB — CERVICOVAGINAL ANCILLARY ONLY
Bacterial Vaginitis (gardnerella): POSITIVE — AB
Candida Glabrata: NEGATIVE
Candida Vaginitis: POSITIVE — AB
Chlamydia: NEGATIVE
Comment: NEGATIVE
Comment: NEGATIVE
Comment: NEGATIVE
Comment: NEGATIVE
Comment: NEGATIVE
Comment: NORMAL
Neisseria Gonorrhea: NEGATIVE
Trichomonas: POSITIVE — AB

## 2023-03-13 ENCOUNTER — Telehealth: Payer: Self-pay

## 2023-03-13 ENCOUNTER — Encounter: Payer: Self-pay | Admitting: Physician Assistant

## 2023-03-13 MED ORDER — METRONIDAZOLE 500 MG PO TABS: 500.0000 mg | ORAL_TABLET | Freq: Two times a day (BID) | ORAL | 0 refills | Status: AC

## 2023-03-13 MED ORDER — FLUCONAZOLE 150 MG PO TABS: 150.0000 mg | ORAL_TABLET | Freq: Once | ORAL | 0 refills | Status: AC

## 2023-03-13 NOTE — Telephone Encounter (Signed)
Contacted patient by phone.  Verified identity using two identifiers.  Provided positive result.  Reviewed safe sex practices, notifying partners, and refraining from sexual activities for 7 days from time of treatment.  Patient verified understanding, all questions answered.    Per protocol, pt requires tx with metronidazole and Diflcuan.  Reviewed with patient, verified pharmacy, prescription sent.

## 2023-06-15 DIAGNOSIS — Z114 Encounter for screening for human immunodeficiency virus [HIV]: Secondary | ICD-10-CM | POA: Diagnosis not present

## 2023-06-15 DIAGNOSIS — N760 Acute vaginitis: Secondary | ICD-10-CM | POA: Diagnosis not present

## 2023-06-15 DIAGNOSIS — Z113 Encounter for screening for infections with a predominantly sexual mode of transmission: Secondary | ICD-10-CM | POA: Diagnosis not present

## 2024-01-25 ENCOUNTER — Ambulatory Visit
